# Patient Record
Sex: Female | Born: 1960 | Race: White | Hispanic: No | Marital: Single | Smoking: Current every day smoker
Health system: Southern US, Community
[De-identification: ages and names within clinical notes are randomized; demographics above are authoritative.]

## PROBLEM LIST (undated history)

## (undated) DIAGNOSIS — E119 Type 2 diabetes mellitus without complications: Secondary | ICD-10-CM

## (undated) DIAGNOSIS — I10 Essential (primary) hypertension: Secondary | ICD-10-CM

## (undated) DIAGNOSIS — E785 Hyperlipidemia, unspecified: Secondary | ICD-10-CM

## (undated) DIAGNOSIS — J449 Chronic obstructive pulmonary disease, unspecified: Secondary | ICD-10-CM

## (undated) DIAGNOSIS — K279 Peptic ulcer, site unspecified, unspecified as acute or chronic, without hemorrhage or perforation: Secondary | ICD-10-CM

## (undated) HISTORY — PX: ABDOMINAL HYSTERECTOMY: SHX81

## (undated) HISTORY — DX: Peptic ulcer, site unspecified, unspecified as acute or chronic, without hemorrhage or perforation: K27.9

## (undated) HISTORY — DX: Type 2 diabetes mellitus without complications: E11.9

## (undated) HISTORY — DX: Hyperlipidemia, unspecified: E78.5

## (undated) HISTORY — PX: NOVASURE ABLATION: SHX5394

## (undated) HISTORY — DX: Essential (primary) hypertension: I10

## (undated) HISTORY — DX: Chronic obstructive pulmonary disease, unspecified: J44.9

## (undated) HISTORY — PX: BLADDER SUSPENSION: SHX72

---

## 2002-01-23 ENCOUNTER — Encounter: Payer: Self-pay | Admitting: Emergency Medicine

## 2002-01-23 ENCOUNTER — Emergency Department (HOSPITAL_COMMUNITY): Admission: EM | Admit: 2002-01-23 | Discharge: 2002-01-23 | Payer: Self-pay | Admitting: Emergency Medicine

## 2002-04-29 ENCOUNTER — Emergency Department (HOSPITAL_COMMUNITY): Admission: EM | Admit: 2002-04-29 | Discharge: 2002-04-29 | Payer: Self-pay | Admitting: *Deleted

## 2002-11-14 ENCOUNTER — Encounter: Payer: Self-pay | Admitting: *Deleted

## 2002-11-14 ENCOUNTER — Emergency Department (HOSPITAL_COMMUNITY): Admission: EM | Admit: 2002-11-14 | Discharge: 2002-11-14 | Payer: Self-pay | Admitting: *Deleted

## 2004-02-01 ENCOUNTER — Emergency Department (HOSPITAL_COMMUNITY): Admission: EM | Admit: 2004-02-01 | Discharge: 2004-02-01 | Payer: Self-pay | Admitting: Emergency Medicine

## 2004-05-28 ENCOUNTER — Ambulatory Visit (HOSPITAL_COMMUNITY): Admission: RE | Admit: 2004-05-28 | Discharge: 2004-05-28 | Payer: Self-pay | Admitting: Family Medicine

## 2004-06-17 ENCOUNTER — Ambulatory Visit (HOSPITAL_COMMUNITY): Admission: RE | Admit: 2004-06-17 | Discharge: 2004-06-17 | Payer: Self-pay | Admitting: Family Medicine

## 2004-07-09 ENCOUNTER — Ambulatory Visit (HOSPITAL_COMMUNITY): Admission: RE | Admit: 2004-07-09 | Discharge: 2004-07-09 | Payer: Self-pay | Admitting: Family Medicine

## 2005-03-03 ENCOUNTER — Emergency Department (HOSPITAL_COMMUNITY): Admission: EM | Admit: 2005-03-03 | Discharge: 2005-03-03 | Payer: Self-pay | Admitting: Emergency Medicine

## 2005-03-05 ENCOUNTER — Emergency Department (HOSPITAL_COMMUNITY): Admission: EM | Admit: 2005-03-05 | Discharge: 2005-03-05 | Payer: Self-pay | Admitting: Emergency Medicine

## 2005-07-15 ENCOUNTER — Ambulatory Visit (HOSPITAL_COMMUNITY): Admission: RE | Admit: 2005-07-15 | Discharge: 2005-07-15 | Payer: Self-pay | Admitting: Family Medicine

## 2005-08-02 ENCOUNTER — Emergency Department (HOSPITAL_COMMUNITY): Admission: EM | Admit: 2005-08-02 | Discharge: 2005-08-02 | Payer: Self-pay | Admitting: Emergency Medicine

## 2006-07-19 ENCOUNTER — Ambulatory Visit (HOSPITAL_COMMUNITY): Admission: RE | Admit: 2006-07-19 | Discharge: 2006-07-19 | Payer: Self-pay | Admitting: Family Medicine

## 2010-10-19 ENCOUNTER — Encounter: Payer: Self-pay | Admitting: Family Medicine

## 2011-06-14 ENCOUNTER — Emergency Department: Payer: Self-pay | Admitting: Emergency Medicine

## 2011-06-28 ENCOUNTER — Emergency Department: Payer: Self-pay | Admitting: Emergency Medicine

## 2011-08-12 ENCOUNTER — Emergency Department: Payer: Self-pay

## 2011-09-16 ENCOUNTER — Emergency Department: Payer: Self-pay

## 2011-09-25 ENCOUNTER — Encounter: Payer: Self-pay | Admitting: Cardiovascular Disease

## 2011-09-25 ENCOUNTER — Ambulatory Visit (INDEPENDENT_AMBULATORY_CARE_PROVIDER_SITE_OTHER): Payer: Medicaid Other | Admitting: Cardiovascular Disease

## 2011-09-25 DIAGNOSIS — R0602 Shortness of breath: Secondary | ICD-10-CM

## 2011-09-25 DIAGNOSIS — R079 Chest pain, unspecified: Secondary | ICD-10-CM | POA: Insufficient documentation

## 2011-09-25 NOTE — Assessment & Plan Note (Signed)
Erika Downs presents with episodes of chest pain. These chest pains or pleuritic and has been mostly constant for the past 10 days. I doubt that this is due to a cardiac etiology. I suspect that this may be pleurisy or perhaps a pulmonary embolus. We'll schedule her for a CT angiogram of the chest to rule out pulmonary embolus.  I've asked her to get a general medical doctor for further evaluation. Given her negative workup so far and the relative constant nature the chest pain, I don't think that she needs a stress test.  If the CT scan shows possible and she will need to be admitted for initiation of heparin and Coumadin. We'll see her for followup if that is the case. Otherwise she'll follow up with a general medical Dr. And I'll see her on an as-needed basis.

## 2011-09-25 NOTE — Progress Notes (Signed)
    Erika Downs Date of Birth  [birth year] [facility]     [place] [facility]  [address]    [address]   [address] [place], [place]  [address]    [place], [place]  [address] [phone]  Fax  [phone]  [phone]  Fax [phone]   History of Present Illness:  Erika Downs is a 50 year old female with a history of chest pains for the past 10 days. She has been seen at [facility] as well as [facility] in [place]. Her workup has been negative so far. She's had negative troponin levels at each hospital. She's not had a stress test.  The patient has been there constantly for 10 days.  The pain has a definite pleuritic component.   She is more short of breath for the past several weeks.  She denies any fever. She denies any sputum production.    She smokes 4 cigarettes a day.  She used to smoke more.  No current outpatient prescriptions on file prior to visit.    Allergies  Allergen Reactions  . Ibuprofen   . Penicillins     Past Medical History  Diagnosis Date  . Chest pain   . Hyperlipidemia     History reviewed. No pertinent past surgical history.  History  Smoking status  . Current Everyday Smoker  . Types: Cigarettes  Smokeless tobacco  . Not on file    History  Alcohol Use: Not on file    History reviewed. No pertinent family history.  Reviw of Systems:  Reviewed in the HPI.  All other systems are negative.  Physical Exam: BP 118/74  Pulse 80  Ht 5' 2.5" (1.588 m)  Wt 165 lb (74.844 kg)  BMI 29.70 kg/m2 The patient is alert and oriented x 3.    Pt is emotional and crying - holding her chest  Skin: warm and dry.  Color is normal.    HEENT:   Normocephalic/atraumatic. Is no JVD.  Lungs: clear, no rubs   Heart: RR, no m/g/r    Abdomen: soft, moderately obese  Extremities:  No c/c/e. No palpable cords  Neuro:  Non focal.  CN II-XII intact.     ECG: NSR, no ST or T wave changes.  Assessment / Plan:

## 2011-09-25 NOTE — Patient Instructions (Signed)
We will send you for CT scan now of your chest and will call you with results.

## 2011-12-05 ENCOUNTER — Ambulatory Visit: Payer: Self-pay | Admitting: Internal Medicine

## 2011-12-05 LAB — RAPID INFLUENZA A&B ANTIGENS

## 2011-12-19 ENCOUNTER — Emergency Department: Payer: Self-pay | Admitting: Emergency Medicine

## 2011-12-19 LAB — BASIC METABOLIC PANEL
BUN: 13 mg/dL (ref 7–18)
Calcium, Total: 9 mg/dL (ref 8.5–10.1)
Creatinine: 0.7 mg/dL (ref 0.60–1.30)
EGFR (Non-African Amer.): >60
Glucose: 93 mg/dL (ref 65–99)
Potassium: 3.9 mmol/L (ref 3.5–5.1)
Sodium: 145 mmol/L (ref 136–145)

## 2011-12-19 LAB — TROPONIN I: Troponin-I: <0.02 ng/mL

## 2011-12-19 LAB — CBC
MCHC: 34.2 g/dL (ref 32.0–36.0)
RBC: 4.55 10*6/uL (ref 3.80–5.20)
RDW: 13.4 % (ref 11.5–14.5)
WBC: 8.9 10*3/uL (ref 3.6–11.0)

## 2011-12-21 ENCOUNTER — Emergency Department: Payer: Self-pay

## 2012-03-31 ENCOUNTER — Emergency Department: Payer: Self-pay | Admitting: Emergency Medicine

## 2012-05-08 ENCOUNTER — Emergency Department: Payer: Self-pay | Admitting: Emergency Medicine

## 2012-06-18 ENCOUNTER — Emergency Department: Payer: Self-pay | Admitting: Internal Medicine

## 2012-09-12 ENCOUNTER — Emergency Department: Payer: Self-pay | Admitting: Emergency Medicine

## 2012-09-12 LAB — CBC WITH DIFFERENTIAL/PLATELET
Basophil #: 0 10*3/uL (ref 0.0–0.1)
Basophil %: 0.5 %
Eosinophil #: 0.2 10*3/uL (ref 0.0–0.7)
Eosinophil %: 2.1 %
HGB: 14.9 g/dL (ref 12.0–16.0)
Lymphocyte %: 32.6 %
MCH: 31.8 pg (ref 26.0–34.0)
Neutrophil %: 55 %
Platelet: 250 10*3/uL (ref 150–440)
RBC: 4.68 10*6/uL (ref 3.80–5.20)
WBC: 8.6 10*3/uL (ref 3.6–11.0)

## 2012-09-12 LAB — COMPREHENSIVE METABOLIC PANEL
Albumin: 3.9 g/dL (ref 3.4–5.0)
Alkaline Phosphatase: 102 U/L (ref 50–136)
Anion Gap: 6 — ABNORMAL LOW (ref 7–16)
BUN: 15 mg/dL (ref 7–18)
Bilirubin,Total: 0.2 mg/dL (ref 0.2–1.0)
Co2: 25 mmol/L (ref 21–32)
Creatinine: 0.6 mg/dL (ref 0.60–1.30)
Osmolality: 282 (ref 275–301)
SGOT(AST): 23 U/L (ref 15–37)
SGPT (ALT): 21 U/L (ref 12–78)

## 2013-01-28 ENCOUNTER — Emergency Department: Payer: Self-pay | Admitting: Emergency Medicine

## 2013-07-11 ENCOUNTER — Emergency Department: Payer: Self-pay | Admitting: Emergency Medicine

## 2013-07-21 ENCOUNTER — Ambulatory Visit: Payer: Self-pay | Admitting: Otolaryngology

## 2013-08-03 ENCOUNTER — Emergency Department: Payer: Self-pay | Admitting: Emergency Medicine

## 2014-02-27 ENCOUNTER — Emergency Department: Payer: Self-pay | Admitting: Emergency Medicine

## 2014-03-29 ENCOUNTER — Emergency Department: Payer: Self-pay | Admitting: Emergency Medicine

## 2014-12-13 ENCOUNTER — Emergency Department: Payer: Self-pay | Admitting: Emergency Medicine

## 2015-03-28 ENCOUNTER — Emergency Department: Payer: Medicaid Other

## 2015-03-28 ENCOUNTER — Encounter: Payer: Self-pay | Admitting: Emergency Medicine

## 2015-03-28 ENCOUNTER — Emergency Department
Admission: EM | Admit: 2015-03-28 | Discharge: 2015-03-28 | Disposition: A | Discharge status: Home / Self Care | Payer: Medicaid Other | Attending: Emergency Medicine | Admitting: Emergency Medicine

## 2015-03-28 DIAGNOSIS — Y998 Other external cause status: Secondary | ICD-10-CM | POA: Insufficient documentation

## 2015-03-28 DIAGNOSIS — S80212A Abrasion, left knee, initial encounter: Secondary | ICD-10-CM | POA: Insufficient documentation

## 2015-03-28 DIAGNOSIS — Y9289 Other specified places as the place of occurrence of the external cause: Secondary | ICD-10-CM | POA: Insufficient documentation

## 2015-03-28 DIAGNOSIS — S46912A Strain of unspecified muscle, fascia and tendon at shoulder and upper arm level, left arm, initial encounter: Secondary | ICD-10-CM | POA: Insufficient documentation

## 2015-03-28 DIAGNOSIS — S99911A Unspecified injury of right ankle, initial encounter: Secondary | ICD-10-CM | POA: Insufficient documentation

## 2015-03-28 DIAGNOSIS — Z880 Allergy status to penicillin: Secondary | ICD-10-CM | POA: Insufficient documentation

## 2015-03-28 DIAGNOSIS — S80211A Abrasion, right knee, initial encounter: Secondary | ICD-10-CM

## 2015-03-28 DIAGNOSIS — S5002XA Contusion of left elbow, initial encounter: Secondary | ICD-10-CM | POA: Insufficient documentation

## 2015-03-28 DIAGNOSIS — Y9389 Activity, other specified: Secondary | ICD-10-CM | POA: Insufficient documentation

## 2015-03-28 DIAGNOSIS — S0990XA Unspecified injury of head, initial encounter: Secondary | ICD-10-CM | POA: Insufficient documentation

## 2015-03-28 DIAGNOSIS — W1839XA Other fall on same level, initial encounter: Secondary | ICD-10-CM | POA: Insufficient documentation

## 2015-03-28 DIAGNOSIS — Z720 Tobacco use: Secondary | ICD-10-CM | POA: Insufficient documentation

## 2015-03-28 MED ORDER — CYCLOBENZAPRINE HCL 10 MG PO TABS
ORAL_TABLET | ORAL | Status: AC
  Filled 2015-03-28: qty 1

## 2015-03-28 MED ORDER — BACITRACIN 500 UNIT/GM EX OINT
1.0000 "application " | TOPICAL_OINTMENT | Freq: Two times a day (BID) | CUTANEOUS | Status: DC
  Administered 2015-03-28: 1 via TOPICAL

## 2015-03-28 MED ORDER — TRAMADOL HCL 50 MG PO TABS
50.0000 mg | ORAL_TABLET | Freq: Once | ORAL | Status: AC
  Administered 2015-03-28: 50 mg via ORAL

## 2015-03-28 MED ORDER — OXYCODONE HCL 5 MG PO TABS
ORAL_TABLET | ORAL | Status: AC
  Filled 2015-03-28: qty 1

## 2015-03-28 MED ORDER — OXYCODONE HCL 5 MG PO TABS
5.0000 mg | ORAL_TABLET | Freq: Once | ORAL | Status: AC
  Administered 2015-03-28: 5 mg via ORAL

## 2015-03-28 MED ORDER — BACITRACIN ZINC 500 UNIT/GM EX OINT
TOPICAL_OINTMENT | CUTANEOUS | Status: AC
  Filled 2015-03-28: qty 0.9

## 2015-03-28 MED ORDER — CYCLOBENZAPRINE HCL 10 MG PO TABS: 10.0000 mg | ORAL_TABLET | Freq: Three times a day (TID) | ORAL | Status: DC | PRN

## 2015-03-28 MED ORDER — TRAMADOL HCL 50 MG PO TABS: 50.0000 mg | ORAL_TABLET | Freq: Four times a day (QID) | ORAL | Status: DC | PRN

## 2015-03-28 MED ORDER — TRAMADOL HCL 50 MG PO TABS
ORAL_TABLET | ORAL | Status: AC
  Filled 2015-03-28: qty 1

## 2015-03-28 MED ORDER — CYCLOBENZAPRINE HCL 10 MG PO TABS
5.0000 mg | ORAL_TABLET | Freq: Once | ORAL | Status: AC
  Administered 2015-03-28: 5 mg via ORAL

## 2015-03-28 NOTE — ED Provider Notes (Signed)
[facility] Emergency Department Provider Note  ____________________________________________  Time seen: Approximately 8:46 PM  I have reviewed the triage vital signs and the nursing notes.   HISTORY  Chief Complaint Fall   HPI Erika Downs is a 53 y.o. female who presents to the emergency department for evaluation of left shoulder, left elbow, and right ankle pain. She states that she rolled her right ankle after stepping on a rock and caused her to fall. Pain is "worse than a 10". She states that she has not taken anything for pain since the fall. She denies striking her head. She denies loss of consciousness.   Past Medical History  Diagnosis Date  . Chest pain   . Hyperlipidemia     Patient Active Problem List   Diagnosis Date Noted  . Chest pain 09/25/2011    Past Surgical History  Procedure Laterality Date  . Bladder suspension    . Novasure ablation      No current outpatient prescriptions on file.  Allergies Ibuprofen and Penicillins  No family history on file.  Social History History  Substance Use Topics  . Smoking status: Current Every Day Smoker    Types: Cigarettes  . Smokeless tobacco: Not on file  . Alcohol Use: Yes    Review of Systems Constitutional: No fever/chills Eyes: No visual changes. ENT: No sore throat. Cardiovascular: Denies chest pain. Respiratory: Denies shortness of breath. Gastrointestinal: No abdominal pain.  No nausea, no vomiting.  No diarrhea.  No constipation. Musculoskeletal: Left shoulder, left elbow, right ankle, bilateral knee pain Skin: Abrasions to left elbow and bilateral knees. Neurological: Negative for headaches, focal weakness or numbness.  10-point ROS otherwise negative.  ____________________________________________   PHYSICAL EXAM:  VITAL SIGNS: ED Triage Vitals  Enc Vitals Group     BP 03/28/15 1952 150/96 mmHg     Pulse Rate 03/28/15 1952 69     Resp 03/28/15 1952 16      Temp 03/28/15 1952 98 F (36.7 C)     Temp Source 03/28/15 1952 Oral     SpO2 03/28/15 1952 96 %     Weight 03/28/15 1952 160 lb (72.576 kg)     Height 03/28/15 1952  (1.575 m)     Head Cir --      Peak Flow --      Pain Score 03/28/15 1953 10     Pain Loc --      Pain Edu? --      Excl. in GC? --     Constitutional: Alert and oriented. Well appearing and in no acute distress. Eyes: Conjunctivae are normal. PERRL. EOMI. Head: Atraumatic. Nose: No congestion/rhinnorhea. Mouth/Throat: Mucous membranes are moist.  Oropharynx non-erythematous. Neck: No stridor.   Cardiovascular: Normal rate, regular rhythm. Grossly normal heart sounds.  Good peripheral circulation. Respiratory: Normal respiratory effort.  No retractions. Lungs CTAB. Gastrointestinal: Soft and nontender. No distention. No abdominal bruits. No CVA tenderness. Musculoskeletal: Patient unwilling to attempt to move left arm at shoulder or elbow due to pain. Mild swelling present to right ankle.  No joint effusions. Neurologic:  Normal speech and language. No gross focal neurologic deficits are appreciated. Speech is normal. No gait instability. Skin:  Skin is warm, dry and intact. No rash noted. Psychiatric: Mood and affect are normal. Speech and behavior are normal.  ____________________________________________   LABS (all labs ordered are listed, but only abnormal results are displayed)  Labs Reviewed - No data to display ____________________________________________  EKG   ____________________________________________  RADIOLOGY  Images of the right ankle, left elbow and left shoulder reviewed. No acute pathology identified. Images viewed by me. ____________________________________________   PROCEDURES  Procedure(s) performed: Ace bandage applied to right ankle, neurovascular intact post application.  Sling applied to left arm. Bacitracin dressings applied to abrasions by RN.  Critical Care  performed: No  ____________________________________________   INITIAL IMPRESSION / ASSESSMENT AND PLAN / ED COURSE  Pertinent labs & imaging results that were available during my care of the patient were reviewed by me and considered in my medical decision making (see chart for details).  Patient was advised to follow up with the orthopedic doctor for symptoms that are not improving over the week. She was advised to return to the ER for symptoms that change or worsen if unable to schedule an appointment. ____________________________________________   FINAL CLINICAL IMPRESSION(S) / ED DIAGNOSES  Final diagnoses:  None      Chinita PesterCari B Marico Buckle, FNP 03/28/15 2211  Loleta Roseory Forbach, MD 03/29/15 2109

## 2015-03-28 NOTE — ED Notes (Signed)
Patient states she rolled her foot on a rock and fell. C/o right ankle, left shoulder and left elbow pain. Abrasion to left knee and left elbow. Denies any dizziness or lightheadedness.

## 2015-03-28 NOTE — ED Notes (Signed)
Pt back from xray at this time.

## 2015-04-24 ENCOUNTER — Encounter: Payer: Self-pay | Admitting: *Deleted

## 2015-04-24 ENCOUNTER — Emergency Department
Admission: EM | Admit: 2015-04-24 | Discharge: 2015-04-24 | Disposition: A | Discharge status: Home / Self Care | Payer: Self-pay | Attending: Emergency Medicine | Admitting: Emergency Medicine

## 2015-04-24 DIAGNOSIS — X58XXXA Exposure to other specified factors, initial encounter: Secondary | ICD-10-CM | POA: Insufficient documentation

## 2015-04-24 DIAGNOSIS — Y998 Other external cause status: Secondary | ICD-10-CM | POA: Insufficient documentation

## 2015-04-24 DIAGNOSIS — S46212A Strain of muscle, fascia and tendon of other parts of biceps, left arm, initial encounter: Secondary | ICD-10-CM | POA: Insufficient documentation

## 2015-04-24 DIAGNOSIS — Y9389 Activity, other specified: Secondary | ICD-10-CM | POA: Insufficient documentation

## 2015-04-24 DIAGNOSIS — Z880 Allergy status to penicillin: Secondary | ICD-10-CM | POA: Insufficient documentation

## 2015-04-24 DIAGNOSIS — Y9289 Other specified places as the place of occurrence of the external cause: Secondary | ICD-10-CM | POA: Insufficient documentation

## 2015-04-24 DIAGNOSIS — Z720 Tobacco use: Secondary | ICD-10-CM | POA: Insufficient documentation

## 2015-04-24 MED ORDER — TRAMADOL HCL 50 MG PO TABS: 50.0000 mg | ORAL_TABLET | Freq: Two times a day (BID) | ORAL | Status: DC

## 2015-04-24 MED ORDER — CYCLOBENZAPRINE HCL 5 MG PO TABS: 5.0000 mg | ORAL_TABLET | Freq: Three times a day (TID) | ORAL | Status: DC | PRN

## 2015-04-24 NOTE — ED Notes (Signed)
Pain to left upper arm beginning a few weeks ago. Pain is from elbow to shoulder. Very painful to medial arm, not so much to distal area. Painful to move arm in any direction. Pain is 10/10. Patient has used ice and heat. No use of ibuprofen or tylenol. Denies any injury to arm.

## 2015-04-24 NOTE — Discharge Instructions (Signed)
Muscle Strain A muscle strain is an injury that occurs when a muscle is stretched beyond its normal length. Usually a small number of muscle fibers are torn when this happens. Muscle strain is rated in degrees. First-degree strains have the least amount of muscle fiber tearing and pain. Second-degree and third-degree strains have increasingly more tearing and pain.  Usually, recovery from muscle strain takes 1-2 weeks. Complete healing takes 5-6 weeks.  CAUSES  Muscle strain happens when a sudden, violent force placed on a muscle stretches it too far. This may occur with lifting, sports, or a fall.  RISK FACTORS Muscle strain is especially common in athletes.  SIGNS AND SYMPTOMS At the site of the muscle strain, there may be:  Pain.  Bruising.  Swelling.  Difficulty using the muscle due to pain or lack of normal function. DIAGNOSIS  Your health care provider will perform a physical exam and ask about your medical history. TREATMENT  Often, the best treatment for a muscle strain is resting, icing, and applying cold compresses to the injured area.  HOME CARE INSTRUCTIONS   Use the PRICE method of treatment to promote muscle healing during the first 2-3 days after your injury. The PRICE method involves:  Protecting the muscle from being injured again.  Restricting your activity and resting the injured body part.  Icing your injury. To do this, put ice in a plastic bag. Place a towel between your skin and the bag. Then, apply the ice and leave it on from 15-20 minutes each hour. After the third day, switch to moist heat packs.  Apply compression to the injured area with a splint or elastic bandage. Be careful not to wrap it too tightly. This may interfere with blood circulation or increase swelling.  Elevate the injured body part above the level of your heart as often as you can.  Only take over-the-counter or prescription medicines for pain, discomfort, or fever as directed by your  health care provider.  Warming up prior to exercise helps to prevent future muscle strains. SEEK MEDICAL CARE IF:   You have increasing pain or swelling in the injured area.  You have numbness, tingling, or a significant loss of strength in the injured area. MAKE SURE YOU:   Understand these instructions.  Will watch your condition.  Will get help right away if you are not doing well or get worse. Document Released: 09/14/2005 Document Revised: 07/05/2013 Document Reviewed: 04/13/2013 [facility] Patient Information 2015 [place], [place]. This information is not intended to replace advice given to you by your health care provider. Make sure you discuss any questions you have with your health care provider.   Take the prescription meds as directed. Apply ice and moist heat as needed for pain relief. Follow-up with one of the community clinics for ongoing symptoms.

## 2015-04-24 NOTE — ED Provider Notes (Signed)
[facility] Emergency Department Provider Note ____________________________________________  Time seen: 1829  I have reviewed the triage vital signs and the nursing notes.  HISTORY  Chief Complaint  Arm Pain  HPI Erika Downs is a 54 y.o. female who is right-hand dominant, who reports to the ED with his upper left arm for the last few weeks. She reports the pain to the musculature between the elbow and the shoulder. She describes the pain is increased with movement of the arm she rates the pain at a 10/10 in triage. She has use ice and heat with no significant relief, and has not dosed any over-the-counter medications for pain relief. She was seen here about a month prior for injuries to the left arm following a fall. Radiology did not find any fracture or dislocation to the left upper extremity. She denies any interim injury, trauma, fall, or accident.  Past Medical History  Diagnosis Date  . Chest pain   . Hyperlipidemia     Patient Active Problem List   Diagnosis Date Noted  . Chest pain 09/25/2011    Past Surgical History  Procedure Laterality Date  . Bladder suspension    . Novasure ablation      Current Outpatient Rx  Name  Route  Sig  Dispense  Refill  . cyclobenzaprine (FLEXERIL) 5 MG tablet   Oral   Take 1 tablet (5 mg total) by mouth every 8 (eight) hours as needed for muscle spasms.   12 tablet   0   . traMADol (ULTRAM) 50 MG tablet   Oral   Take 1 tablet (50 mg total) by mouth 2 (two) times daily.   10 tablet   0    Allergies Ibuprofen and Penicillins  No family history on file.  Social History History  Substance Use Topics  . Smoking status: Current Every Day Smoker    Types: Cigarettes  . Smokeless tobacco: Not on file  . Alcohol Use: Yes   Review of Systems  Constitutional: Negative for fever. Eyes: Negative for visual changes. ENT: Negative for sore throat. Cardiovascular: Negative for chest pain. Respiratory:  Negative for shortness of breath. Gastrointestinal: Negative for abdominal pain, vomiting and diarrhea. Genitourinary: Negative for dysuria. Musculoskeletal: Negative for back pain. Right upper arm pain. Skin: Negative for rash. Neurological: Negative for headaches, focal weakness or numbness. ____________________________________________  PHYSICAL EXAM:  VITAL SIGNS: ED Triage Vitals  Enc Vitals Group     BP 04/24/15 1745 155/94 mmHg     Pulse Rate 04/24/15 1745 69     Resp 04/24/15 1745 16     Temp 04/24/15 1745 97.7 F (36.5 C)     Temp Source 04/24/15 1745 Oral     SpO2 04/24/15 1745 98 %     Weight 04/24/15 1745 160 lb (72.576 kg)     Height 04/24/15 1745 5\' 2"  (1.575 m)     Head Cir --      Peak Flow --      Pain Score 04/24/15 1746 9     Pain Loc --      Pain Edu? --      Excl. in GC? --    Constitutional: Alert and oriented. Well appearing and in no distress. Eyes: Conjunctivae are normal. PERRL. Normal extraocular movements. ENT   Head: Normocephalic and atraumatic.   Nose: No congestion/rhinnorhea.   Mouth/Throat: Mucous membranes are moist.   Neck: Supple. No thyromegaly. Hematological/Lymphatic/Immunilogical: No cervical lymphadenopathy. Cardiovascular: Normal rate, regular rhythm. Normal  distal pulses Respiratory: Normal respiratory effort.  Musculoskeletal: Nontender with normal range of motion in right UE. No deformity, abrasion, laceration, or infection. Normal grip strength bilaterally. Negative Yeargason's.   Neurologic:  CN II-XII grossly intact. Normal UE DTRs bilaterally. Normal gait without ataxia. Normal speech and language. No gross focal neurologic deficits are appreciated. Skin:  Skin is warm, dry and intact. No rash noted. Psychiatric: Mood and affect are normal. Patient exhibits appropriate insight and judgment. ____________________________________________  INITIAL IMPRESSION / ASSESSMENT AND PLAN / ED COURSE  Left arm biceps  strain. Treatment with Ultram & Flexeril. Ice and heat therapy for pain relief.  Referral to local community health clinic for further care. ____________________________________________  FINAL CLINICAL IMPRESSION(S) / ED DIAGNOSES  Final diagnoses:  Biceps muscle strain, left, initial encounter     Lissa Hoard, PA-C 04/24/15 1859  Jene Every, MD 04/24/15 2320

## 2015-04-24 NOTE — ED Notes (Signed)
Pt states "soreness" pain from her left elbow through her upper arm for two weeks. Pain worse when she extends her arm. Pt denies injury, but she did fall about 1 month ago onto her left side.

## 2016-01-05 ENCOUNTER — Emergency Department
Admission: EM | Admit: 2016-01-05 | Discharge: 2016-01-05 | Disposition: A | Discharge status: Home / Self Care | Payer: Self-pay | Attending: Emergency Medicine | Admitting: Emergency Medicine

## 2016-01-05 ENCOUNTER — Encounter: Payer: Self-pay | Admitting: Emergency Medicine

## 2016-01-05 ENCOUNTER — Emergency Department: Payer: Self-pay

## 2016-01-05 DIAGNOSIS — F17210 Nicotine dependence, cigarettes, uncomplicated: Secondary | ICD-10-CM | POA: Insufficient documentation

## 2016-01-05 DIAGNOSIS — E785 Hyperlipidemia, unspecified: Secondary | ICD-10-CM | POA: Insufficient documentation

## 2016-01-05 DIAGNOSIS — R0789 Other chest pain: Secondary | ICD-10-CM | POA: Insufficient documentation

## 2016-01-05 DIAGNOSIS — Z888 Allergy status to other drugs, medicaments and biological substances status: Secondary | ICD-10-CM | POA: Insufficient documentation

## 2016-01-05 DIAGNOSIS — Z880 Allergy status to penicillin: Secondary | ICD-10-CM | POA: Insufficient documentation

## 2016-01-05 DIAGNOSIS — Z79899 Other long term (current) drug therapy: Secondary | ICD-10-CM | POA: Insufficient documentation

## 2016-01-05 DIAGNOSIS — M62838 Other muscle spasm: Secondary | ICD-10-CM

## 2016-01-05 DIAGNOSIS — M62830 Muscle spasm of back: Secondary | ICD-10-CM | POA: Insufficient documentation

## 2016-01-05 MED ORDER — OXYCODONE-ACETAMINOPHEN 5-325 MG PO TABS
1.0000 | ORAL_TABLET | ORAL | Status: DC | PRN
Start: 2016-01-05 — End: 2016-08-06

## 2016-01-05 MED ORDER — DIAZEPAM 2 MG PO TABS
2.0000 mg | ORAL_TABLET | Freq: Once | ORAL | Status: AC
  Administered 2016-01-05: 2 mg via ORAL
  Filled 2016-01-05: qty 1

## 2016-01-05 MED ORDER — DIAZEPAM 2 MG PO TABS: 2.0000 mg | ORAL_TABLET | Freq: Three times a day (TID) | ORAL | Status: DC | PRN

## 2016-01-05 MED ORDER — OXYCODONE-ACETAMINOPHEN 5-325 MG PO TABS
2.0000 | ORAL_TABLET | Freq: Once | ORAL | Status: AC
  Administered 2016-01-05: 2 via ORAL
  Filled 2016-01-05: qty 2

## 2016-01-05 NOTE — Discharge Instructions (Signed)
Begin taking medication as directed. Percocet as needed for pain and diazepam as needed for muscle spasms. Use ice or heat for comfort. [facility] if any continued problems.

## 2016-01-05 NOTE — ED Provider Notes (Signed)
[facility] Emergency Department Provider Note  ____________________________________________  Time seen: Approximately 1:00 PM  I have reviewed the triage vital signs and the nursing notes.   HISTORY  Chief Complaint Back Pain    HPI Erika Downs is a 55 y.o. female is here with complaint of right sided back pain while she was getting ready for church this morning. Patient states that she sneezed and immediately felt a "popping sensation" followed by sudden onset of her back pain. She has not taken any over-the-counter medication for her pain. She states with movement she is in a great deal of pain. She denies any previous injury to her ribs. Movement increases her pain and nothing seems to improve it. At present patient's pain is 10 over 10.   Past Medical History  Diagnosis Date  . Chest pain   . Hyperlipidemia     Patient Active Problem List   Diagnosis Date Noted  . Chest pain 09/25/2011    Past Surgical History  Procedure Laterality Date  . Bladder suspension    . Novasure ablation      Current Outpatient Rx  Name  Route  Sig  Dispense  Refill  . cyclobenzaprine (FLEXERIL) 5 MG tablet   Oral   Take 1 tablet (5 mg total) by mouth every 8 (eight) hours as needed for muscle spasms.   12 tablet   0   . diazepam (VALIUM) 2 MG tablet   Oral   Take 1 tablet (2 mg total) by mouth every 8 (eight) hours as needed for muscle spasms.   9 tablet   0   . oxyCODONE-acetaminophen (PERCOCET) 5-325 MG tablet   Oral   Take 1-2 tablets by mouth every 4 (four) hours as needed for severe pain.   30 tablet   0   . traMADol (ULTRAM) 50 MG tablet   Oral   Take 1 tablet (50 mg total) by mouth 2 (two) times daily.   10 tablet   0     Allergies Ibuprofen and Penicillins  History reviewed. No pertinent family history.  Social History Social History  Substance Use Topics  . Smoking status: Current Every Day Smoker -- 0.50 packs/day    Types:  Cigarettes  . Smokeless tobacco: Never Used  . Alcohol Use: Yes    Review of Systems Constitutional: No fever/chills ENT: No sore throat. Cardiovascular: Denies chest pain. Respiratory: Shortness of breath due to right sided chest pain as stated above. Gastrointestinal: No abdominal pain.  No nausea, no vomiting.   Genitourinary: Negative for dysuria. Musculoskeletal: Positive right posterior and lateral rib pain. Skin: Negative for rash. Neurological: Negative for headaches, focal weakness or numbness.  10-point ROS otherwise negative.  ____________________________________________   PHYSICAL EXAM:  VITAL SIGNS: ED Triage Vitals  Enc Vitals Group     BP 01/05/16 1251 144/92 mmHg     Pulse Rate 01/05/16 1251 70     Resp 01/05/16 1251 22     Temp 01/05/16 1251 97.2 F (36.2 C)     Temp Source 01/05/16 1251 Oral     SpO2 01/05/16 1251 96 %     Weight 01/05/16 1251 160 lb (72.576 kg)     Height 01/05/16 1251 5\' 2"  (1.575 m)     Head Cir --      Peak Flow --      Pain Score --      Pain Loc --      Pain Edu? --  Excl. in GC? --     Constitutional: Alert and oriented. Well appearing and in no acute distress. Eyes: Conjunctivae are normal. PERRL. EOMI. Head: Atraumatic. Nose: No congestion/rhinnorhea. Mouth/Throat: Mucous membranes are moist.  Oropharynx non-erythematous. Neck: No stridor.   Cardiovascular: Normal rate, regular rhythm. Grossly normal heart sounds.  Good peripheral circulation. Respiratory: Decreased respiratory effort secondary to patient's pain. With deep inspiration patient's pain is increased on the right lateral rib cage area. There is marked tenderness on palpation of the lower posterior and lateral ribs on the right. There is no gross deformity or ecchymosis noted. Patient is guarding range of motion secondary to pain. Musculoskeletal: No lower extremity tenderness nor edema.  No joint effusions. Neurologic:  Normal speech and language. No gross  focal neurologic deficits are appreciated. No gait instability. Skin:  Skin is warm, dry and intact. No rash noted. Psychiatric: Mood and affect are normal. Speech and behavior are normal.  ____________________________________________   LABS (all labs ordered are listed, but only abnormal results are displayed)  Labs Reviewed - No data to display  RADIOLOGY  X-ray right ribs shows no acute cardiopulmonary disease or fracture of the right ribs per radiologist. ____________________________________________   PROCEDURES  Procedure(s) performed: None  Critical Care performed: No  ____________________________________________   INITIAL IMPRESSION / ASSESSMENT AND PLAN / ED COURSE  Pertinent labs & imaging results that were available during my care of the patient were reviewed by me and considered in my medical decision making (see chart for details).  Patient was given Percocet and diazepam for pain and muscle spasms and improved somewhat in the emergency room. Patient was made aware that there is no fractures as previously thought. Patient is continue on Percocet and diazepam. She is encouraged to use ice or heat to the muscles as needed for comfort. She is to follow-up with [facility] clinic if any continued problems. ____________________________________________   FINAL CLINICAL IMPRESSION(S) / ED DIAGNOSES  Final diagnoses:  Chest wall pain  Muscle spasm      Tommi Rumps, PA-C 01/05/16 1535  Rockne Menghini, MD 01/06/16 1507

## 2016-01-05 NOTE — ED Notes (Signed)
Pt states she was getting ready for church this morning when she sneezed. Pt then states she felt a "popping sensation" with sudden onset back pain R side at the mid back.

## 2016-08-06 ENCOUNTER — Emergency Department
Admission: EM | Admit: 2016-08-06 | Discharge: 2016-08-06 | Disposition: A | Discharge status: Home / Self Care | Payer: Self-pay | Attending: Emergency Medicine | Admitting: Emergency Medicine

## 2016-08-06 ENCOUNTER — Emergency Department: Payer: Self-pay

## 2016-08-06 ENCOUNTER — Encounter: Payer: Self-pay | Admitting: Medical Oncology

## 2016-08-06 DIAGNOSIS — Z886 Allergy status to analgesic agent status: Secondary | ICD-10-CM | POA: Insufficient documentation

## 2016-08-06 DIAGNOSIS — Z880 Allergy status to penicillin: Secondary | ICD-10-CM | POA: Insufficient documentation

## 2016-08-06 DIAGNOSIS — F17210 Nicotine dependence, cigarettes, uncomplicated: Secondary | ICD-10-CM | POA: Insufficient documentation

## 2016-08-06 DIAGNOSIS — M5412 Radiculopathy, cervical region: Secondary | ICD-10-CM | POA: Insufficient documentation

## 2016-08-06 DIAGNOSIS — I10 Essential (primary) hypertension: Secondary | ICD-10-CM | POA: Insufficient documentation

## 2016-08-06 DIAGNOSIS — E785 Hyperlipidemia, unspecified: Secondary | ICD-10-CM | POA: Insufficient documentation

## 2016-08-06 MED ORDER — METHYLPREDNISOLONE SODIUM SUCC 125 MG IJ SOLR
80.0000 mg | Freq: Once | INTRAMUSCULAR | Status: AC
  Administered 2016-08-06: 80 mg via INTRAMUSCULAR
  Filled 2016-08-06: qty 2

## 2016-08-06 MED ORDER — TRAMADOL HCL 50 MG PO TABS: 50.0000 mg | ORAL_TABLET | Freq: Four times a day (QID) | ORAL | 0 refills | Status: DC | PRN

## 2016-08-06 MED ORDER — METHYLPREDNISOLONE 4 MG PO TBPK: ORAL_TABLET | ORAL | 0 refills | Status: DC

## 2016-08-06 MED ORDER — ORPHENADRINE CITRATE 30 MG/ML IJ SOLN
60.0000 mg | Freq: Two times a day (BID) | INTRAMUSCULAR | Status: DC
  Administered 2016-08-06: 60 mg via INTRAMUSCULAR
  Filled 2016-08-06: qty 2

## 2016-08-06 MED ORDER — CYCLOBENZAPRINE HCL 10 MG PO TABS: 10.0000 mg | ORAL_TABLET | Freq: Three times a day (TID) | ORAL | 0 refills | Status: DC | PRN

## 2016-08-06 MED ORDER — HYDROMORPHONE HCL 1 MG/ML IJ SOLN
1.0000 mg | Freq: Once | INTRAMUSCULAR | Status: AC
  Administered 2016-08-06: 1 mg via INTRAMUSCULAR
  Filled 2016-08-06: qty 1

## 2016-08-06 NOTE — ED Provider Notes (Signed)
[facility] Emergency Department Provider Note   ____________________________________________   First MD Initiated Contact with Patient 08/06/16 [phone]     (approximate)  I have reviewed the triage vital signs and the nursing notes.   HISTORY  Chief Complaint Back Pain and Neck Pain    HPI Erika Downs is a 55 y.o. female patient complaining of neck and upper back pain which began yesterday. Patient denies any injury. Patient stated pain increases with movement. Patient described a pain as "electric shocks" to her upper back. Denies loss of function of the upper extremities. She rates the pain as a 10 over 10. No palliative measures taken for this complaint.   Past Medical History:  Diagnosis Date  . Chest pain   . Hyperlipidemia     Patient Active Problem List   Diagnosis Date Noted  . Chest pain 09/25/2011    Past Surgical History:  Procedure Laterality Date  . BLADDER SUSPENSION    . NOVASURE ABLATION      Prior to Admission medications   Medication Sig Start Date End Date Taking? Authorizing Provider  cyclobenzaprine (FLEXERIL) 10 MG tablet Take 1 tablet (10 mg total) by mouth 3 (three) times daily as needed. 08/06/16   Joni Reiningonald K Smith, PA-C  methylPREDNISolone (MEDROL DOSEPAK) 4 MG TBPK tablet Take Tapered dose as directed 08/06/16   Joni Reiningonald K Smith, PA-C  traMADol (ULTRAM) 50 MG tablet Take 1 tablet (50 mg total) by mouth every 6 (six) hours as needed. 08/06/16 08/06/17  Joni Reiningonald K Smith, PA-C    Allergies Ibuprofen and Penicillins  No family history on file.  Social History Social History  Substance Use Topics  . Smoking status: Current Every Day Smoker    Packs/day: 0.50    Types: Cigarettes  . Smokeless tobacco: Never Used  . Alcohol use Yes    Review of Systems Constitutional: No fever/chills Eyes: No visual changes. ENT: No sore throat. Cardiovascular: Denies chest pain. Respiratory: Denies shortness of  breath. Gastrointestinal: No abdominal pain.  No nausea, no vomiting.  No diarrhea.  No constipation. Genitourinary: Negative for dysuria. Musculoskeletal: Negative for back pain. Skin: Negative for rash. Neurological: Negative for headaches, focal weakness or numbness. Endocrine:Hypertension hyperlipidemia. Allergic/Immunilogical: Penicillin and ibuprofen.   ____________________________________________   PHYSICAL EXAM:  VITAL SIGNS: ED Triage Vitals [08/06/16 0844]  Enc Vitals Group     BP (!) 156/47     Pulse Rate 66     Resp 19     Temp 97.4 F (36.3 C)     Temp Source Oral     SpO2 97 %     Weight 155 lb (70.3 kg)     Height 5\' 2"  (1.575 m)     Head Circumference      Peak Flow      Pain Score 10     Pain Loc      Pain Edu?      Excl. in GC?     Constitutional: Alert and oriented.Moderate distress Eyes: Conjunctivae are normal. PERRL. EOMI. Head: Atraumatic. Nose: No congestion/rhinnorhea. Mouth/Throat: Mucous membranes are moist.  Oropharynx non-erythematous. Neck: No stridor.  Cervical l spine tenderness to palpation. C4-C6. Hematological/Lymphatic/Immunilogical: No cervical lymphadenopathy. Cardiovascular: Normal rate, regular rhythm. Grossly normal heart sounds.  Good peripheral circulation. Limited blood pressure Respiratory: Normal respiratory effort.  No retractions. Lungs CTAB. Gastrointestinal: Soft and nontender. No distention. No abdominal bruits. No CVA tenderness. Musculoskeletal: No lower extremity tenderness nor edema.  No joint effusions. Neurologic:  Normal speech and language. No gross focal neurologic deficits are appreciated. No gait instability. Skin:  Skin is warm, dry and intact. No rash noted. Psychiatric: Mood and affect are normal. Speech and behavior are normal.  ____________________________________________   LABS (all labs ordered are listed, but only abnormal results are displayed)  Labs Reviewed - No data to  display ____________________________________________  EKG  ____________________________________________  RADIOLOGY  No acute findings cervical spine x-ray. There is disc space narrowing from C3-C6. ____________________________________________   PROCEDURES  Procedure(s) performed: None  Procedures  Critical Care performed: No  ____________________________________________   INITIAL IMPRESSION / ASSESSMENT AND PLAN / ED COURSE  Pertinent labs & imaging results that were available during my care of the patient were reviewed by me and considered in my medical decision making (see chart for details).  Cervical radiculopathy. Patient given discharge care instructions. Patient given prescription for Medrol Dosepak. Flexeril and tramadol. Patient advised follow "clinic for continued care.  Clinical Course    Status post IM injection of Dilaudid, Solu-Medrol, and Robaxin patient states pain more than 80% resolved.  ____________________________________________   FINAL CLINICAL IMPRESSION(S) / ED DIAGNOSES  Final diagnoses:  Cervical radiculopathy      NEW MEDICATIONS STARTED DURING THIS VISIT:  New Prescriptions   CYCLOBENZAPRINE (FLEXERIL) 10 MG TABLET    Take 1 tablet (10 mg total) by mouth 3 (three) times daily as needed.   METHYLPREDNISOLONE (MEDROL DOSEPAK) 4 MG TBPK TABLET    Take Tapered dose as directed   TRAMADOL (ULTRAM) 50 MG TABLET    Take 1 tablet (50 mg total) by mouth every 6 (six) hours as needed.     Note:  This document was prepared using Dragon voice recognition software and may include unintentional dictation errors.    Joni ReiningRonald K Smith, PA-C 08/06/16 1015    Myrna Blazeravid Matthew Schaevitz, MD 08/06/16 [phone]

## 2016-08-06 NOTE — ED Triage Notes (Addendum)
Pt reports that she began having upper back pain with pain in neck also yesterday. Pt denies injury. Pain worsens with movement.

## 2016-08-30 ENCOUNTER — Emergency Department
Admission: EM | Admit: 2016-08-30 | Discharge: 2016-08-30 | Disposition: A | Discharge status: Home / Self Care | Payer: Self-pay | Attending: Emergency Medicine | Admitting: Emergency Medicine

## 2016-08-30 DIAGNOSIS — X500XXA Overexertion from strenuous movement or load, initial encounter: Secondary | ICD-10-CM | POA: Insufficient documentation

## 2016-08-30 DIAGNOSIS — Y929 Unspecified place or not applicable: Secondary | ICD-10-CM | POA: Insufficient documentation

## 2016-08-30 DIAGNOSIS — S39012A Strain of muscle, fascia and tendon of lower back, initial encounter: Secondary | ICD-10-CM | POA: Insufficient documentation

## 2016-08-30 DIAGNOSIS — Y9389 Activity, other specified: Secondary | ICD-10-CM | POA: Insufficient documentation

## 2016-08-30 DIAGNOSIS — Y999 Unspecified external cause status: Secondary | ICD-10-CM | POA: Insufficient documentation

## 2016-08-30 DIAGNOSIS — F17210 Nicotine dependence, cigarettes, uncomplicated: Secondary | ICD-10-CM | POA: Insufficient documentation

## 2016-08-30 MED ORDER — CYCLOBENZAPRINE HCL 10 MG PO TABS: 10.0000 mg | ORAL_TABLET | Freq: Three times a day (TID) | ORAL | 0 refills | Status: DC | PRN

## 2016-08-30 MED ORDER — HYDROMORPHONE HCL 1 MG/ML IJ SOLN
1.0000 mg | Freq: Once | INTRAMUSCULAR | Status: AC
  Administered 2016-08-30: 1 mg via INTRAMUSCULAR
  Filled 2016-08-30: qty 1

## 2016-08-30 MED ORDER — DIAZEPAM 2 MG PO TABS
2.0000 mg | ORAL_TABLET | Freq: Once | ORAL | Status: AC
  Administered 2016-08-30: 2 mg via ORAL
  Filled 2016-08-30: qty 1

## 2016-08-30 MED ORDER — OXYCODONE-ACETAMINOPHEN 5-325 MG PO TABS: 1.0000 | ORAL_TABLET | ORAL | 0 refills | Status: DC | PRN

## 2016-08-30 NOTE — ED Provider Notes (Signed)
[facility] Emergency Department Provider Note ____________________________________________  Time seen: Approximately 1:27 PM  I have reviewed the triage vital signs and the nursing notes.   HISTORY  Chief Complaint Back Pain    HPI Erika Downs is a 55 y.o. female who presents to the emergency department for severe lower back pain. She states that she lifted a large, heavy television yesterday and has had pain since. No relief with tylenol.   Past Medical History:  Diagnosis Date  . Chest pain   . Hyperlipidemia     Patient Active Problem List   Diagnosis Date Noted  . Chest pain 09/25/2011    Past Surgical History:  Procedure Laterality Date  . BLADDER SUSPENSION    . NOVASURE ABLATION      Prior to Admission medications   Medication Sig Start Date End Date Taking? Authorizing Provider  cyclobenzaprine (FLEXERIL) 10 MG tablet Take 1 tablet (10 mg total) by mouth 3 (three) times daily as needed for muscle spasms. 08/30/16   Chinita Pesterari B Devyn Sheerin, FNP  oxyCODONE-acetaminophen (ROXICET) 5-325 MG tablet Take 1 tablet by mouth every 4 (four) hours as needed for severe pain. 08/30/16   Chinita Pesterari B Randen Kauth, FNP    Allergies Ibuprofen and Penicillins  No family history on file.  Social History Social History  Substance Use Topics  . Smoking status: Current Every Day Smoker    Packs/day: 0.50    Types: Cigarettes  . Smokeless tobacco: Never Used  . Alcohol use Yes    Review of Systems Constitutional: No recent illness. Cardiovascular: Denies chest pain or palpitations. Respiratory: Denies shortness of breath. Musculoskeletal: Pain in lower back Skin: Negative for rash, wound, lesion. Neurological: Negative for focal weakness or numbness. Negative for loss of bowel or bladder function.   ____________________________________________   PHYSICAL EXAM:  VITAL SIGNS: ED Triage Vitals  Enc Vitals Group     BP 08/30/16 0947 (!) 176/97     Pulse  Rate 08/30/16 0947 77     Resp 08/30/16 0947 (!) 22     Temp 08/30/16 0947 97.8 F (36.6 C)     Temp Source 08/30/16 0947 Oral     SpO2 08/30/16 0947 100 %     Weight 08/30/16 0946 155 lb (70.3 kg)     Height 08/30/16 0946 5\' 2"  (1.575 m)     Head Circumference --      Peak Flow --      Pain Score 08/30/16 0946 10     Pain Loc --      Pain Edu? --      Excl. in GC? --     Constitutional: Alert and oriented. Well appearing and in no acute distress. Eyes: Conjunctivae are normal. EOMI. Head: Atraumatic. Neck: No stridor.  Respiratory: Normal respiratory effort.   Musculoskeletal: Paraspinal lumbar tenderness to palpation on exam. No midline tenderness. Full ROM of the back is possible, but patient reports pain.  Neurologic:  Normal speech and language. No gross focal neurologic deficits are appreciated. Speech is normal. No gait instability. Skin:  Skin is warm, dry and intact. Atraumatic. Psychiatric: Mood and affect are normal. Speech and behavior are normal.  ____________________________________________   LABS (all labs ordered are listed, but only abnormal results are displayed)  Labs Reviewed - No data to display ____________________________________________  RADIOLOGY  Not indicated. ____________________________________________   PROCEDURES  Procedure(s) performed: None   ____________________________________________   INITIAL IMPRESSION / ASSESSMENT AND PLAN / ED COURSE  Clinical Course  Pertinent labs & imaging results that were available during my care of the patient were reviewed by me and considered in my medical decision making (see chart for details).  Patient given IM Dilaudid and PO Valium with significant relief while in the ER. She was given prescriptions for flexeril and percocet and advised to follow up with orthopedics for symptoms that are not improving over the week. She was advised to return to the ER for symptoms that change or worsen if  unable to schedule an appointment. ____________________________________________   FINAL CLINICAL IMPRESSION(S) / ED DIAGNOSES  Final diagnoses:  Strain of lumbar region, initial encounter       Chinita PesterCari B Soniya Ashraf, FNP 08/30/16 1404    Jene Everyobert Kinner, MD 08/31/16 1704

## 2016-08-30 NOTE — ED Triage Notes (Signed)
Back pain after lifting heavy TV yesterday. Pt ambulatory, worsening pain while sitting.

## 2016-08-30 NOTE — ED Notes (Signed)
See triage note  States she developed pain to lower back which moves to mid back since yesterday  States she felt the pain after lifting a heavy TV  Increased pain with sitting and ambulation ambulates with limp d/t pain

## 2016-12-10 ENCOUNTER — Emergency Department: Payer: Self-pay

## 2016-12-10 ENCOUNTER — Emergency Department
Admission: EM | Admit: 2016-12-10 | Discharge: 2016-12-10 | Disposition: A | Discharge status: Home / Self Care | Payer: Self-pay | Attending: Emergency Medicine | Admitting: Emergency Medicine

## 2016-12-10 DIAGNOSIS — R079 Chest pain, unspecified: Secondary | ICD-10-CM | POA: Insufficient documentation

## 2016-12-10 DIAGNOSIS — R0602 Shortness of breath: Secondary | ICD-10-CM | POA: Insufficient documentation

## 2016-12-10 DIAGNOSIS — F17210 Nicotine dependence, cigarettes, uncomplicated: Secondary | ICD-10-CM | POA: Insufficient documentation

## 2016-12-10 LAB — LIPID PANEL
CHOL/HDL RATIO: 5.2 ratio
CHOLESTEROL: 228 mg/dL — AB (ref 0–200)
HDL: 44 mg/dL (ref >40)
LDL Cholesterol: 164 mg/dL — ABNORMAL HIGH (ref 0–99)
Triglycerides: 99 mg/dL (ref <150)
VLDL: 20 mg/dL (ref 0–40)

## 2016-12-10 LAB — BASIC METABOLIC PANEL
ANION GAP: 10 (ref 5–15)
BUN: 15 mg/dL (ref 6–20)
CHLORIDE: 105 mmol/L (ref 101–111)
CO2: 25 mmol/L (ref 22–32)
Calcium: 9.3 mg/dL (ref 8.9–10.3)
Creatinine, Ser: 0.63 mg/dL (ref 0.44–1.00)
Glucose, Bld: 86 mg/dL (ref 65–99)
POTASSIUM: 4.2 mmol/L (ref 3.5–5.1)
SODIUM: 140 mmol/L (ref 135–145)

## 2016-12-10 LAB — CBC
HEMATOCRIT: 45.8 % (ref 35.0–47.0)
HEMOGLOBIN: 16.2 g/dL — AB (ref 12.0–16.0)
MCH: 32 pg (ref 26.0–34.0)
MCHC: 35.3 g/dL (ref 32.0–36.0)
MCV: 90.8 fL (ref 80.0–100.0)
Platelets: 313 10*3/uL (ref 150–440)
RBC: 5.05 MIL/uL (ref 3.80–5.20)
RDW: 13.1 % (ref 11.5–14.5)
WBC: 7.4 10*3/uL (ref 3.6–11.0)

## 2016-12-10 LAB — FIBRIN DERIVATIVES D-DIMER (ARMC ONLY): FIBRIN DERIVATIVES D-DIMER (ARMC): 871.7 — AB (ref 0.00–499.00)

## 2016-12-10 LAB — TROPONIN I: Troponin I: <0.03 ng/mL (ref <0.03)

## 2016-12-10 MED ORDER — DIAZEPAM 5 MG PO TABS: 5.0000 mg | ORAL_TABLET | Freq: Three times a day (TID) | ORAL | 0 refills | Status: DC | PRN

## 2016-12-10 MED ORDER — IPRATROPIUM-ALBUTEROL 0.5-2.5 (3) MG/3ML IN SOLN
RESPIRATORY_TRACT | Status: AC
  Administered 2016-12-10: 3 mL via RESPIRATORY_TRACT
  Filled 2016-12-10: qty 3

## 2016-12-10 MED ORDER — IOPAMIDOL (ISOVUE-370) INJECTION 76%
75.0000 mL | Freq: Once | INTRAVENOUS | Status: AC | PRN
  Administered 2016-12-10: 75 mL via INTRAVENOUS

## 2016-12-10 MED ORDER — SIMVASTATIN 10 MG PO TABS: 10.0000 mg | ORAL_TABLET | Freq: Every evening | ORAL | 11 refills | Status: DC

## 2016-12-10 MED ORDER — IPRATROPIUM-ALBUTEROL 0.5-2.5 (3) MG/3ML IN SOLN
3.0000 mL | Freq: Once | RESPIRATORY_TRACT | Status: AC
  Administered 2016-12-10: 3 mL via RESPIRATORY_TRACT

## 2016-12-10 MED ORDER — OXYCODONE-ACETAMINOPHEN 5-325 MG PO TABS
2.0000 | ORAL_TABLET | Freq: Once | ORAL | Status: AC
  Administered 2016-12-10: 2 via ORAL
  Filled 2016-12-10: qty 2

## 2016-12-10 NOTE — ED Triage Notes (Signed)
Pt reports non radiating central chest pain that worsens when she lays down. Pt reports the pain began last night. Pt reports cough for several weeks.

## 2016-12-10 NOTE — ED Notes (Signed)
Patient transported to CT 

## 2016-12-10 NOTE — ED Notes (Signed)
ED Provider at bedside. 

## 2016-12-10 NOTE — ED Provider Notes (Signed)
[facility] Emergency Department Provider Note        Time seen: ----------------------------------------- 11:26 AM on 12/10/2016 -----------------------------------------    I have reviewed the triage vital signs and the nursing notes.   HISTORY  Chief Complaint Chest Pain    HPI Erika Downs is a 56 y.o. female who presents to ER for nonradiating central chest pain that worsens when she lays down. Patient reports pain that began last night she has had a cough for several weeks. Currently the pain is 10 out of 10.Pain she states is across her whole chest and worsened when she lays down but is not worsened by anything else. She has not had a productive cough but has had a cough nonetheless. She denies fevers, chills, vomiting or diarrhea. She has never had symptoms like this before.   Past Medical History:  Diagnosis Date  . Chest pain   . Hyperlipidemia     Patient Active Problem List   Diagnosis Date Noted  . Chest pain 09/25/2011    Past Surgical History:  Procedure Laterality Date  . BLADDER SUSPENSION    . NOVASURE ABLATION      Allergies Ibuprofen and Penicillins  Social History Social History  Substance Use Topics  . Smoking status: Current Every Day Smoker    Packs/day: 0.50    Types: Cigarettes  . Smokeless tobacco: Never Used  . Alcohol use Yes    Review of Systems Constitutional: Negative for fever. Cardiovascular: Positive for chest pain Respiratory: Positive for shortness of breath Gastrointestinal: Negative for abdominal pain, vomiting and diarrhea. Genitourinary: Negative for dysuria. Musculoskeletal: Negative for back pain. Skin: Negative for rash. Neurological: Negative for headaches, focal weakness or numbness.  10-point ROS otherwise negative.  ____________________________________________   PHYSICAL EXAM:  VITAL SIGNS: ED Triage Vitals  Enc Vitals Group     BP 12/10/16 1005 (!) 164/104     Pulse  Rate 12/10/16 1005 71     Resp 12/10/16 1005 20     Temp 12/10/16 1005 97.6 F (36.4 C)     Temp Source 12/10/16 1005 Oral     SpO2 12/10/16 1005 100 %     Weight 12/10/16 1019 165 lb (74.8 kg)     Height 12/10/16 1019 5\' 2"  (1.575 m)     Head Circumference --      Peak Flow --      Pain Score 12/10/16 1019 10     Pain Loc --      Pain Edu? --      Excl. in GC? --     Constitutional: Alert and oriented. Well appearing and in no distress. Eyes: Conjunctivae are normal. PERRL. Normal extraocular movements. ENT   Head: Normocephalic and atraumatic.   Nose: No congestion/rhinnorhea.   Mouth/Throat: Mucous membranes are moist.   Neck: No stridor. Cardiovascular: Normal rate, regular rhythm. No murmurs, rubs, or gallops. Respiratory: Normal respiratory effort without tachypnea nor retractions. Breath sounds are clear and equal bilaterally. No wheezes/rales/rhonchi. Gastrointestinal: Soft and nontender. Normal bowel sounds Musculoskeletal: Nontender with normal range of motion in all extremities. No lower extremity tenderness nor edema. Neurologic:  Normal speech and language. No gross focal neurologic deficits are appreciated.  Skin:  Skin is warm, dry and intact. No rash noted. Psychiatric: Mood and affect are normal. Speech and behavior are normal.  ____________________________________________  EKG: Interpreted by me. Sinus rhythm with a rate of 66 bpm, normal PR interval, normal QRS, normal QT, normal axis.  ____________________________________________  ED COURSE:  Pertinent labs & imaging results that were available during my care of the patient were reviewed by me and considered in my medical decision making (see chart for details). Patient presents to ER with chest pain of uncertain etiology. We will assess with labs and imaging.   Procedures ____________________________________________   LABS (pertinent positives/negatives)  Labs Reviewed  CBC - Abnormal;  Notable for the following:       Result Value   Hemoglobin 16.2 (*)    All other components within normal limits  FIBRIN DERIVATIVES D-DIMER ([facility] ONLY) - Abnormal; Notable for the following:    Fibrin derivatives D-dimer ([facility]) 871.70 (*)    All other components within normal limits  LIPID PANEL - Abnormal; Notable for the following:    Cholesterol 228 (*)    LDL Cholesterol 164 (*)    All other components within normal limits  BASIC METABOLIC PANEL  TROPONIN I  TROPONIN I    RADIOLOGY  Chest x-ray is normal IMPRESSION: No evidence of pulmonary embolism or other active disease within the thorax.  Aortic atherosclerosis. ____________________________________________  FINAL ASSESSMENT AND PLAN  Chest pain, shortness of breath, Hyperlipidemia  Plan: Patient with labs and imaging as dictated above. No clear etiology for symptoms. She'll be discharged with Motrin and Valium. We'll start her on simvastatin. She is stable for outpatient follow-up with her doctor.   Emily Filbert, MD   Note: This note was generated in part or whole with voice recognition software. Voice recognition is usually quite accurate but there are transcription errors that can and very often do occur. I apologize for any typographical errors that were not detected and corrected.     Emily Filbert, MD 12/10/16 [phone]

## 2016-12-14 ENCOUNTER — Telehealth: Payer: Self-pay

## 2016-12-14 DIAGNOSIS — Z720 Tobacco use: Secondary | ICD-10-CM | POA: Diagnosis present

## 2016-12-14 NOTE — Telephone Encounter (Signed)
Tried to call patient to make ED follow up appointment  [facility] [place] "I am at [place] at the moment I do not need to talk to you" before I could ask when I could call her back she hang up on me.  Will try again at a later time she was seen on 12/10/16 for CP

## 2016-12-21 NOTE — Telephone Encounter (Signed)
Pt hung up on me and yell at the phone stating she would "rather die than come back, that is the [facility]"

## 2017-04-13 ENCOUNTER — Encounter: Payer: Self-pay | Admitting: Emergency Medicine

## 2017-04-13 ENCOUNTER — Emergency Department
Admission: EM | Admit: 2017-04-13 | Discharge: 2017-04-14 | Disposition: A | Discharge status: Home / Self Care | Payer: Self-pay | Attending: Emergency Medicine | Admitting: Emergency Medicine

## 2017-04-13 ENCOUNTER — Emergency Department: Payer: Self-pay

## 2017-04-13 DIAGNOSIS — R0789 Other chest pain: Secondary | ICD-10-CM | POA: Insufficient documentation

## 2017-04-13 DIAGNOSIS — F17210 Nicotine dependence, cigarettes, uncomplicated: Secondary | ICD-10-CM | POA: Insufficient documentation

## 2017-04-13 LAB — BASIC METABOLIC PANEL
ANION GAP: 8 (ref 5–15)
BUN: 22 mg/dL — ABNORMAL HIGH (ref 6–20)
CALCIUM: 9.3 mg/dL (ref 8.9–10.3)
CO2: 25 mmol/L (ref 22–32)
Chloride: 106 mmol/L (ref 101–111)
Creatinine, Ser: 0.63 mg/dL (ref 0.44–1.00)
GFR calc non Af Amer: >60 mL/min (ref >60)
Glucose, Bld: 92 mg/dL (ref 65–99)
Potassium: 4 mmol/L (ref 3.5–5.1)
Sodium: 139 mmol/L (ref 135–145)

## 2017-04-13 LAB — CBC
HCT: 45.7 % (ref 35.0–47.0)
HEMOGLOBIN: 15.6 g/dL (ref 12.0–16.0)
MCH: 31.5 pg (ref 26.0–34.0)
MCHC: 34.1 g/dL (ref 32.0–36.0)
MCV: 92.2 fL (ref 80.0–100.0)
Platelets: 277 10*3/uL (ref 150–440)
RBC: 4.96 MIL/uL (ref 3.80–5.20)
RDW: 13.4 % (ref 11.5–14.5)
WBC: 8.1 10*3/uL (ref 3.6–11.0)

## 2017-04-13 LAB — TROPONIN I

## 2017-04-13 MED ORDER — OXYCODONE-ACETAMINOPHEN 5-325 MG PO TABS
2.0000 | ORAL_TABLET | Freq: Once | ORAL | Status: AC
  Administered 2017-04-13: 2 via ORAL
  Filled 2017-04-13: qty 2

## 2017-04-13 NOTE — ED Provider Notes (Signed)
[facility] Emergency Department Provider Note       Time seen: ----------------------------------------- 9:35 PM on 04/13/2017 -----------------------------------------     I have reviewed the triage vital signs and the nursing notes.   HISTORY   Chief Complaint Chest Pain    HPI Erika Downs is a 56 y.o. female who presents to the ED for constant central chest pain that started on Friday. Patient states is painful to touch and hurts worse when she takes deep breath. Patient was seen several months ago for similar both here and at [facility] without a specific diagnosis. She denies fevers, chills, or other complaints. She denies any specific chest wall trauma.   Past Medical History:  Diagnosis Date  . Chest pain   . Hyperlipidemia     Patient Active Problem List   Diagnosis Date Noted  . Chest pain 09/25/2011    Past Surgical History:  Procedure Laterality Date  . BLADDER SUSPENSION    . NOVASURE ABLATION      Allergies Ibuprofen and Penicillins  Social History Social History  Substance Use Topics  . Smoking status: Current Every Day Smoker    Packs/day: 0.50    Types: Cigarettes  . Smokeless tobacco: Never Used  . Alcohol use Yes    Review of Systems Constitutional: Negative for fever. Eyes: Negative for vision changes ENT:  Negative for congestion, sore throat Cardiovascular: Positive for chest pain Respiratory: Negative for shortness of breath. Gastrointestinal: Negative for abdominal pain, vomiting and diarrhea. Genitourinary: Negative for dysuria. Musculoskeletal: Negative for back pain. Skin: Negative for rash. Neurological: Negative for headaches, focal weakness or numbness.  All systems negative/normal/unremarkable except as stated in the HPI  ____________________________________________   PHYSICAL EXAM:  VITAL SIGNS: ED Triage Vitals [04/13/17 2055]  Enc Vitals Group     BP (!) 148/88     Pulse Rate 88      Resp 17     Temp 98 F (36.7 C)     Temp Source Oral     SpO2 97 %     Weight 165 lb (74.8 kg)     Height      Head Circumference      Peak Flow      Pain Score      Pain Loc      Pain Edu?      Excl. in GC?     Constitutional: Alert and oriented. Well appearing and in no distress. Eyes: Conjunctivae are normal. Normal extraocular movements. ENT   Head: Normocephalic and atraumatic.   Nose: No congestion/rhinnorhea.   Mouth/Throat: Mucous membranes are moist.   Neck: No stridor. Cardiovascular: Normal rate, regular rhythm. No murmurs, rubs, or gallops. Respiratory: Normal respiratory effort without tachypnea nor retractions. Breath sounds are clear and equal bilaterally. No wheezes/rales/rhonchi. Gastrointestinal: Soft and nontender. Normal bowel sounds Musculoskeletal: Nontender with normal range of motion in extremities. No lower extremity tenderness nor edema. Exquisite right parasternal chest wall tenderness Neurologic:  Normal speech and language. No gross focal neurologic deficits are appreciated.  Skin:  Skin is warm, dry and intact. No rash noted. Psychiatric: Mood and affect are normal. Speech and behavior are normal.  ____________________________________________  EKG: Interpreted by me. Sinus rhythm with a rate of 61 bpm, normal PR, normal QRS, normal QT.  ____________________________________________  ED COURSE:  Pertinent labs & imaging results that were available during my care of the patient were reviewed by me and considered in my medical decision making (see chart for  details). Patient presents for chest pain, we will assess with labs and imaging as indicated.   Procedures ____________________________________________   LABS (pertinent positives/negatives)  Labs Reviewed  BASIC METABOLIC PANEL - Abnormal; Notable for the following:       Result Value   BUN 22 (*)    All other components within normal limits  CBC  TROPONIN I  TROPONIN I     RADIOLOGY  Chest x-ray is normal  ____________________________________________  FINAL ASSESSMENT AND PLAN  Chest x-ray  Plan: Patient's labs and imaging were dictated above. Patient had presented for Chest pain which appears to be musculoskeletal in origin. Repeat troponin is pending at this time.   Emily FilbertWilliams, Jonathan E, MD   Note: This note was generated in part or whole with voice recognition software. Voice recognition is usually quite accurate but there are transcription errors that can and very often do occur. I apologize for any typographical errors that were not detected and corrected.     Emily FilbertWilliams, Jonathan E, MD 04/13/17 2229

## 2017-04-13 NOTE — ED Triage Notes (Signed)
Pt ambulatory to triage with steady gait, no distress noted. Pt c/o of constant central chest pain that started on Friday. Pt sts its painful to the touch and and hurts worse when she takes a deep breath. Pt denies N/V.

## 2017-04-14 MED ORDER — OXYCODONE HCL 5 MG PO TABS
5.0000 mg | ORAL_TABLET | Freq: Once | ORAL | Status: AC
  Administered 2017-04-14: 5 mg via ORAL
  Filled 2017-04-14: qty 1

## 2017-04-14 NOTE — ED Provider Notes (Signed)
Assume care of the patient 11:00 PM from Dr. Mayford KnifeWilliams. Recommendation repeat troponin negative patient was able to be discharged home. Repeat troponin negative and a such patient will be referred to outpatient cardiology.   Darci CurrentBrown, [place] N, MD 04/14/17 [phone]

## 2017-05-20 ENCOUNTER — Encounter: Payer: Self-pay | Admitting: Emergency Medicine

## 2017-05-20 ENCOUNTER — Emergency Department: Payer: Self-pay

## 2017-05-20 ENCOUNTER — Emergency Department
Admission: EM | Admit: 2017-05-20 | Discharge: 2017-05-20 | Disposition: A | Discharge status: Home / Self Care | Payer: Self-pay | Attending: Emergency Medicine | Admitting: Emergency Medicine

## 2017-05-20 DIAGNOSIS — M898X1 Other specified disorders of bone, shoulder: Secondary | ICD-10-CM | POA: Insufficient documentation

## 2017-05-20 DIAGNOSIS — T148XXA Other injury of unspecified body region, initial encounter: Secondary | ICD-10-CM

## 2017-05-20 DIAGNOSIS — F17210 Nicotine dependence, cigarettes, uncomplicated: Secondary | ICD-10-CM | POA: Insufficient documentation

## 2017-05-20 DIAGNOSIS — Z79899 Other long term (current) drug therapy: Secondary | ICD-10-CM | POA: Insufficient documentation

## 2017-05-20 MED ORDER — CYCLOBENZAPRINE HCL 10 MG PO TABS
5.0000 mg | ORAL_TABLET | Freq: Once | ORAL | Status: AC
  Administered 2017-05-20: 5 mg via ORAL
  Filled 2017-05-20: qty 1

## 2017-05-20 MED ORDER — IBUPROFEN 600 MG PO TABS: 600.0000 mg | ORAL_TABLET | Freq: Four times a day (QID) | ORAL | 0 refills | Status: DC | PRN

## 2017-05-20 MED ORDER — OXYCODONE-ACETAMINOPHEN 5-325 MG PO TABS
1.0000 | ORAL_TABLET | Freq: Once | ORAL | Status: AC
  Administered 2017-05-20: 1 via ORAL
  Filled 2017-05-20: qty 1

## 2017-05-20 MED ORDER — LIDOCAINE 5 % EX PTCH
1.0000 | MEDICATED_PATCH | CUTANEOUS | Status: DC
  Administered 2017-05-20: 1 via TRANSDERMAL
  Filled 2017-05-20: qty 1

## 2017-05-20 MED ORDER — CYCLOBENZAPRINE HCL 5 MG PO TABS: 5.0000 mg | ORAL_TABLET | Freq: Three times a day (TID) | ORAL | 0 refills | Status: AC | PRN

## 2017-05-20 MED ORDER — OXYCODONE-ACETAMINOPHEN 5-325 MG PO TABS: 1.0000 | ORAL_TABLET | Freq: Four times a day (QID) | ORAL | 0 refills | Status: DC | PRN

## 2017-05-20 NOTE — ED Notes (Signed)
See triage note  Presents with posterior right shoulder pain since early this am  Denies any specific injury  No deformity noted  Pain increased with movement

## 2017-05-20 NOTE — ED Notes (Signed)
Pt in xray

## 2017-05-20 NOTE — ED Triage Notes (Addendum)
Pt reports right shoulder pain that began this morning. Pt reports difficulty lifting arm over her head. Denies injury.No obvious deformity noted. Pt ambulatory to triage. No apparent distress noted.

## 2017-05-20 NOTE — ED Provider Notes (Signed)
[facility] Emergency Department Provider Note  ____________________________________________  Time seen: Approximately 6:32 PM  I have reviewed the triage vital signs and the nursing notes.   HISTORY  Chief Complaint Shoulder Pain    HPI Erika Downs is a 56 y.o. female that presents to the emergency department for evaluation of right scapular pain. Patient states that she has had surgery on his right shoulder 10 years ago and was told that in timeher scapula would push back. She woke up this morning with a lot of pain behind her shoulder and is wondering if she slept on her shoulder wrong. Pain is worse with moving her right arm. No injury. No shortness of breath, chest pain, nausea, vomiting, abdominal pain, numbness, tingling.   Past Medical History:  Diagnosis Date  . Chest pain   . Hyperlipidemia     Patient Active Problem List   Diagnosis Date Noted  . Chest pain 09/25/2011    Past Surgical History:  Procedure Laterality Date  . BLADDER SUSPENSION    . NOVASURE ABLATION      Prior to Admission medications   Medication Sig Start Date End Date Taking? Authorizing Provider  cyclobenzaprine (FLEXERIL) 5 MG tablet Take 1 tablet (5 mg total) by mouth 3 (three) times daily as needed for muscle spasms. 05/20/17 05/27/17  Enid Derry, PA-C  diazepam (VALIUM) 5 MG tablet Take 1 tablet (5 mg total) by mouth every 8 (eight) hours as needed for muscle spasms. 12/10/16   Emily Filbert, MD  ibuprofen (ADVIL,MOTRIN) 600 MG tablet Take 1 tablet (600 mg total) by mouth every 6 (six) hours as needed. 05/20/17   Enid Derry, PA-C  oxyCODONE-acetaminophen (ROXICET) 5-325 MG tablet Take 1 tablet by mouth every 6 (six) hours as needed. 05/20/17 05/20/18  Enid Derry, PA-C  simvastatin (ZOCOR) 10 MG tablet Take 1 tablet (10 mg total) by mouth every evening. 12/10/16 12/10/17  Emily Filbert, MD    Allergies Ibuprofen and Penicillins  No family  history on file.  Social History Social History  Substance Use Topics  . Smoking status: Current Every Day Smoker    Packs/day: 0.50    Types: Cigarettes  . Smokeless tobacco: Never Used  . Alcohol use Yes     Review of Systems  Constitutional: No fever/chills Cardiovascular: No chest pain. Respiratory: No SOB. Gastrointestinal: No abdominal pain.  No nausea, no vomiting.  Musculoskeletal: Positive for upper back pain. Skin: Negative for rash, abrasions, lacerations, ecchymosis. Neurological: Negative for headaches, numbness or tingling   ____________________________________________   PHYSICAL EXAM:  VITAL SIGNS: ED Triage Vitals  Enc Vitals Group     BP 05/20/17 1749 (!) 186/85     Pulse Rate 05/20/17 1749 (!) 59     Resp 05/20/17 1749 18     Temp 05/20/17 1749 97.6 F (36.4 C)     Temp Source 05/20/17 1749 Oral     SpO2 05/20/17 1749 100 %     Weight 05/20/17 1750 160 lb (72.6 kg)     Height 05/20/17 1750 5\' 2"  (1.575 m)     Head Circumference --      Peak Flow --      Pain Score 05/20/17 1749 10     Pain Loc --      Pain Edu? --      Excl. in GC? --      Constitutional: Alert and oriented. Well appearing and in no acute distress. Eyes: Conjunctivae are normal. PERRL. EOMI.  Head: Atraumatic. ENT:      Ears:      Nose: No congestion/rhinnorhea.      Mouth/Throat: Mucous membranes are moist.  Neck: No stridor.  No cervical spine tenderness to palpation. Cardiovascular: Normal rate, regular rhythm.  Good peripheral circulation. Respiratory: Normal respiratory effort without tachypnea or retractions. Lungs CTAB. Good air entry to the bases with no decreased or absent breath sounds. Musculoskeletal: No gross deformities appreciated. Hypersensitive to palpation over right scapula. No tenderness to palpation over right shoulder. Pain in upper back with right arm movement. No erythema or bruising.  Neurologic:  Normal speech and language. No gross focal  neurologic deficits are appreciated.  Skin:  Skin is warm, dry and intact. No rash noted.   ____________________________________________   LABS (all labs ordered are listed, but only abnormal results are displayed)  Labs Reviewed - No data to display ____________________________________________  EKG   ____________________________________________  RADIOLOGY Lexine Baton, personally viewed and evaluated these images (plain radiographs) as part of my medical decision making, as well as reviewing the written report by the radiologist.  No results found.   DG Clavicle/Scapula IMPRESSION:  Chronic changes to the distal clavicle. No acute abnormalities.      __________________________________________    PROCEDURES  Procedure(s) performed:    Procedures    Medications  oxyCODONE-acetaminophen (PERCOCET/ROXICET) 5-325 MG per tablet 1 tablet (1 tablet Oral Given 05/20/17 1824)  cyclobenzaprine (FLEXERIL) tablet 5 mg (5 mg Oral Given 05/20/17 1904)     ____________________________________________   INITIAL IMPRESSION / ASSESSMENT AND PLAN / ED COURSE  Pertinent labs & imaging results that were available during my care of the patient were reviewed by me and considered in my medical decision making (see chart for details).  Review of the [place] CSRS was performed in accordance of the [facility] prior to dispensing any controlled drugs.  Patient presented to the emergency department for evaluation of right upper back pain. Vital signs and exam are reassuring. Clavicle/scapula x-ray negative for acute changes. Pain is likely musculoskeletal. Patient will be discharged home with prescriptions for ibuprofen. Patient is to follow up with orthopedics as directed. Patient is given ED precautions to return to the ED for any worsening or new symptoms.   ____________________________________________  FINAL CLINICAL IMPRESSION(S) / ED DIAGNOSES  Final diagnoses:  Pain in scapula   Muscle strain      NEW MEDICATIONS STARTED DURING THIS VISIT:  Discharge Medication List as of 05/20/2017  6:59 PM    START taking these medications   Details  ibuprofen (ADVIL,MOTRIN) 600 MG tablet Take 1 tablet (600 mg total) by mouth every 6 (six) hours as needed., Starting Thu 05/20/2017, Print            This chart was dictated using voice recognition software/Dragon. Despite best efforts to proofread, errors can occur which can change the meaning. Any change was purely unintentional.    Enid Derry, PA-C 05/21/17 2019    Loleta Rose, MD 05/21/17 2052

## 2017-09-02 ENCOUNTER — Emergency Department: Payer: Self-pay

## 2017-09-02 ENCOUNTER — Other Ambulatory Visit: Payer: Self-pay

## 2017-09-02 ENCOUNTER — Encounter: Payer: Self-pay | Admitting: Emergency Medicine

## 2017-09-02 ENCOUNTER — Emergency Department
Admission: EM | Admit: 2017-09-02 | Discharge: 2017-09-02 | Disposition: A | Discharge status: Home / Self Care | Payer: Self-pay | Attending: Emergency Medicine | Admitting: Emergency Medicine

## 2017-09-02 DIAGNOSIS — R079 Chest pain, unspecified: Secondary | ICD-10-CM | POA: Insufficient documentation

## 2017-09-02 DIAGNOSIS — Z5321 Procedure and treatment not carried out due to patient leaving prior to being seen by health care provider: Secondary | ICD-10-CM | POA: Insufficient documentation

## 2017-09-02 LAB — BASIC METABOLIC PANEL
Anion gap: 10 (ref 5–15)
BUN: 19 mg/dL (ref 6–20)
CALCIUM: 9.4 mg/dL (ref 8.9–10.3)
CO2: 25 mmol/L (ref 22–32)
Chloride: 104 mmol/L (ref 101–111)
Creatinine, Ser: 0.6 mg/dL (ref 0.44–1.00)
GFR calc Af Amer: >60 mL/min (ref >60)
GLUCOSE: 92 mg/dL (ref 65–99)
Potassium: 4 mmol/L (ref 3.5–5.1)
SODIUM: 139 mmol/L (ref 135–145)

## 2017-09-02 LAB — CBC
HCT: 45 % (ref 35.0–47.0)
Hemoglobin: 15.6 g/dL (ref 12.0–16.0)
MCH: 32.2 pg (ref 26.0–34.0)
MCHC: 34.6 g/dL (ref 32.0–36.0)
MCV: 92.9 fL (ref 80.0–100.0)
PLATELETS: 255 10*3/uL (ref 150–440)
RBC: 4.84 MIL/uL (ref 3.80–5.20)
RDW: 13 % (ref 11.5–14.5)
WBC: 6.4 10*3/uL (ref 3.6–11.0)

## 2017-09-02 LAB — TROPONIN I

## 2017-09-02 NOTE — ED Triage Notes (Signed)
Pt reports chest pain for months but states has been getting worse overnight. Pt states the pain got worse after "getting into it" with her oldest daughter.  Pt states the pain is sharp and is intermittent. Pain is "past a 10" Pt also reports nausea, dizziness and lightheadedness.  Pt does not take any ASA or medication for anxiety at this time.

## 2017-09-02 NOTE — ED Notes (Signed)
Patient called x2 to be roomed.  No answer from patient at this time.

## 2017-09-05 ENCOUNTER — Telehealth: Payer: Self-pay | Admitting: Emergency Medicine

## 2017-09-05 NOTE — Telephone Encounter (Signed)
Called patient due to lwot to inquire about condition and follow up plans. Says unavailable and no voicemail.

## 2018-04-01 ENCOUNTER — Other Ambulatory Visit: Payer: Self-pay

## 2018-04-01 ENCOUNTER — Emergency Department
Admission: EM | Admit: 2018-04-01 | Discharge: 2018-04-01 | Disposition: A | Discharge status: Home / Self Care | Payer: Self-pay | Attending: Emergency Medicine | Admitting: Emergency Medicine

## 2018-04-01 ENCOUNTER — Emergency Department: Payer: Self-pay

## 2018-04-01 ENCOUNTER — Encounter: Payer: Self-pay | Admitting: Emergency Medicine

## 2018-04-01 DIAGNOSIS — F17210 Nicotine dependence, cigarettes, uncomplicated: Secondary | ICD-10-CM | POA: Insufficient documentation

## 2018-04-01 DIAGNOSIS — R0789 Other chest pain: Secondary | ICD-10-CM | POA: Insufficient documentation

## 2018-04-01 DIAGNOSIS — N644 Mastodynia: Secondary | ICD-10-CM | POA: Insufficient documentation

## 2018-04-01 LAB — CBC
HCT: 43.5 % (ref 35.0–47.0)
Hemoglobin: 15.3 g/dL (ref 12.0–16.0)
MCH: 32.5 pg (ref 26.0–34.0)
MCHC: 35.1 g/dL (ref 32.0–36.0)
MCV: 92.6 fL (ref 80.0–100.0)
PLATELETS: 301 10*3/uL (ref 150–440)
RBC: 4.7 MIL/uL (ref 3.80–5.20)
RDW: 13.7 % (ref 11.5–14.5)
WBC: 6.5 10*3/uL (ref 3.6–11.0)

## 2018-04-01 LAB — BASIC METABOLIC PANEL
Anion gap: 11 (ref 5–15)
BUN: 16 mg/dL (ref 6–20)
CHLORIDE: 108 mmol/L (ref 98–111)
CO2: 23 mmol/L (ref 22–32)
CREATININE: 0.48 mg/dL (ref 0.44–1.00)
Calcium: 9.3 mg/dL (ref 8.9–10.3)
GFR calc Af Amer: >60 mL/min (ref >60)
GFR calc non Af Amer: >60 mL/min (ref >60)
GLUCOSE: 92 mg/dL (ref 70–99)
POTASSIUM: 3.9 mmol/L (ref 3.5–5.1)
SODIUM: 142 mmol/L (ref 135–145)

## 2018-04-01 LAB — POCT PREGNANCY, URINE: Preg Test, Ur: NEGATIVE

## 2018-04-01 LAB — TROPONIN I: Troponin I: <0.03 ng/mL (ref <0.03)

## 2018-04-01 MED ORDER — ONDANSETRON HCL 4 MG/2ML IJ SOLN
4.0000 mg | Freq: Once | INTRAMUSCULAR | Status: AC
  Administered 2018-04-01: 4 mg via INTRAVENOUS
  Filled 2018-04-01: qty 2

## 2018-04-01 MED ORDER — HYDROMORPHONE HCL 1 MG/ML IJ SOLN
1.0000 mg | Freq: Once | INTRAMUSCULAR | Status: AC
  Administered 2018-04-01: 1 mg via INTRAVENOUS
  Filled 2018-04-01: qty 1

## 2018-04-01 MED ORDER — OXYCODONE-ACETAMINOPHEN 5-325 MG PO TABS: 1.0000 | ORAL_TABLET | ORAL | 0 refills | Status: AC | PRN

## 2018-04-01 MED ORDER — IOHEXOL 300 MG/ML  SOLN: 75.0000 mL | Freq: Once | INTRAMUSCULAR | Status: DC | PRN

## 2018-04-01 NOTE — ED Notes (Signed)
First Nurse Note: Patient to Room 8.  [facility] aware of room placement.

## 2018-04-01 NOTE — Discharge Instructions (Signed)
ER for new, worsening, persistent severe pain, difficulty breathing, weakness or lightheadedness, fever, rash, or any other new or worsening symptoms that concern you.  Follow-up with your regular doctor.

## 2018-04-01 NOTE — ED Notes (Signed)
Patient transported to Ultrasound 

## 2018-04-01 NOTE — ED Notes (Signed)
Pt placed on 2L BNC due to continuing O2 sats in the 80's. Pt now at 100%.

## 2018-04-01 NOTE — ED Notes (Signed)
Pt not able to sign due to not having any available portable computers. Pt verbalizes understanding prescriptions and follow up care. PT ambulated to lobby with husband.

## 2018-04-01 NOTE — ED Triage Notes (Signed)
Pt to ED via POV stating that she has been having pain in the right breast since Wednesday. Pain states that the pain is consistent. Pt denies any associated symptoms. Pt is in NAD at this time.

## 2018-04-01 NOTE — ED Notes (Signed)
Pt presents with right breast pain since Wednesday. Pt is writhing and crying in pain during assessment. She denies injury; small bruise noted to side of right breast, but pt states the pain is not there. Skin appears to be slightly red, but is red on left breast as well. Pt alert & oriented with NAD noted.

## 2018-04-01 NOTE — ED Provider Notes (Addendum)
[facility] Emergency Department Provider Note ____________________________________________   First MD Initiated Contact with Patient 04/01/18 1101     (approximate)  I have reviewed the triage vital signs and the nursing notes.   HISTORY  Chief Complaint Breast Pain    HPI Erika Downs is a 57 y.o. female with PMH as noted below who presents with acute onset of right-sided breast pain 2 days ago, worsening course, and not associated with any rash or swelling.  No prior history of this pain.  She denies any trauma or injury.  No shortness of breath, weakness or dizziness, or vomiting.  Past Medical History:  Diagnosis Date  . Chest pain   . Hyperlipidemia     Patient Active Problem List   Diagnosis Date Noted  . Chest pain 09/25/2011    Past Surgical History:  Procedure Laterality Date  . BLADDER SUSPENSION    . NOVASURE ABLATION      Prior to Admission medications   Medication Sig Start Date End Date Taking? Authorizing Provider  diazepam (VALIUM) 5 MG tablet Take 1 tablet (5 mg total) by mouth every 8 (eight) hours as needed for muscle spasms. Patient not taking: Reported on 04/01/2018 12/10/16   Emily Filbert, MD  ibuprofen (ADVIL,MOTRIN) 600 MG tablet Take 1 tablet (600 mg total) by mouth every 6 (six) hours as needed. Patient not taking: Reported on 04/01/2018 05/20/17   Enid Derry, PA-C  oxyCODONE-acetaminophen (PERCOCET) 5-325 MG tablet Take 1 tablet by mouth every 4 (four) hours as needed for up to 5 days for severe pain. 04/01/18 04/06/18  Dionne Bucy, MD    Allergies Ibuprofen; Ivp dye [iodinated diagnostic agents]; and Penicillins  No family history on file.  Social History Social History   Tobacco Use  . Smoking status: Current Every Day Smoker    Packs/day: 0.50    Types: Cigarettes  . Smokeless tobacco: Never Used  Substance Use Topics  . Alcohol use: Yes  . Drug use: No    Review of  Systems  Constitutional: No fever. Eyes: No redness. ENT: No neck pain. Cardiovascular: Denies chest pain. Respiratory: Denies shortness of breath. Gastrointestinal: No vomiting Genitourinary: Negative for flank pain.  Musculoskeletal: Negative for back pain. Skin: Negative for rash. Neurological: Negative for headache.   ____________________________________________   PHYSICAL EXAM:  VITAL SIGNS: ED Triage Vitals  Enc Vitals Group     BP 04/01/18 1007 (!) 149/102     Pulse Rate 04/01/18 1007 60     Resp 04/01/18 1007 16     Temp 04/01/18 1007 97.8 F (36.6 C)     Temp Source 04/01/18 1007 Oral     SpO2 04/01/18 1007 96 %     Weight 04/01/18 1009 150 lb (68 kg)     Height 04/01/18 1009 5\' 2"  (1.575 m)     Head Circumference --      Peak Flow --      Pain Score 04/01/18 1009 10     Pain Loc --      Pain Edu? --      Excl. in GC? --     Constitutional: Alert and oriented.  Uncomfortable appearing but in no acute distress. Eyes: Conjunctivae are normal.  Head: Atraumatic. Nose: No congestion/rhinnorhea. Mouth/Throat: Mucous membranes are moist.   Neck: Normal range of motion.  Cardiovascular: Normal rate, regular rhythm. Grossly normal heart sounds.  Good peripheral circulation. Respiratory: Normal respiratory effort.  No retractions. Lungs CTAB. Gastrointestinal: Soft  and nontender. No distention.  Genitourinary: No flank tenderness. Musculoskeletal:  Extremities warm and well perfused.  Neurologic:  Normal speech and language. No gross focal neurologic deficits are appreciated.  Skin:  Skin is warm and dry. No rash noted.  Patient's breasts are somewhat pendulous.  Right upper breast adjacent to the chest wall with tenderness and localized pain, but no swelling, erythema, induration, or warmth.  No palpable masses.  No rash or abnormal cutaneous findings underneath bilateral breasts. Psychiatric: Mood and affect are normal. Speech and behavior are  normal.  ____________________________________________   LABS (all labs ordered are listed, but only abnormal results are displayed)  Labs Reviewed  BASIC METABOLIC PANEL  CBC  TROPONIN I  POC URINE PREG, ED  POCT PREGNANCY, URINE   ____________________________________________  EKG  ED ECG REPORT I, Dionne Bucy, the attending physician, personally viewed and interpreted this ECG.  Date: 04/01/2018 EKG Time: 1012 Rate: 57 Rhythm: normal sinus rhythm QRS Axis: normal Intervals: normal ST/T Wave abnormalities: normal Narrative Interpretation: no evidence of acute ischemia  ____________________________________________  RADIOLOGY  CXR: No acute abnormalities CT chest: No acute abnormalities  ____________________________________________   PROCEDURES  Procedure(s) performed: No  Procedures  Critical Care performed: No ____________________________________________   INITIAL IMPRESSION / ASSESSMENT AND PLAN / ED COURSE  Pertinent labs & imaging results that were available during my care of the patient were reviewed by me and considered in my medical decision making (see chart for details).  57 year old female with PMH as noted above presents with acute onset several days ago of right-sided severe breast pain with no swelling, rash, or other cutaneous findings.  No trauma to the area.  No significant cardio pulmonary symptoms.  On exam, the patient is uncomfortable but relatively well-appearing.  Vital signs are normal except for slight hypertension.  Exam is as described above with some localized tenderness but no cutaneous findings, no swelling, no palpable masses.  EKG is nonischemic.  Overall cause of patient's breast pain is unclear.  There does not seem to be any evidence of breast abscess or mastitis.  Given the lack of associated symptoms I do not suspect cardiac cause and there is no evidence for DVT or PE.  There is no trauma to explain the pain  either.  Initial labs obtained from triage are normal.  I initially considered ultrasound, however after speaking to the radiology technician given that this would only rule in or out abscess without necessarily evaluating for other findings, I elected to obtain a CT instead.  The patient has an allergy to IV contrast, so will be performed without contrast.  If the work-up is negative, I will reassess the patient's clinical status and determine if additional tests are needed or if patient is safe for discharge home.  ----------------------------------------- 2:11 PM on 04/01/2018 -----------------------------------------  CT shows no acute findings.  The patient's pain is significantly improved.  The patient now states she thinks she might have pulled a muscle in the chest wall.  This is entirely consistent with the patient's symptoms.  The lab work-up is also unremarkable, so I do not suspect infectious cause.  I discussed the results of the work-up with the patient.  She feels well to go home.  Return precautions given, and the patient expresses understanding.  ____________________________________________   FINAL CLINICAL IMPRESSION(S) / ED DIAGNOSES  Final diagnoses:  Breast pain  Chest wall pain      NEW MEDICATIONS STARTED DURING THIS VISIT:  Discharge Medication List as of  04/01/2018  2:10 PM       Note:  This document was prepared using Dragon voice recognition software and may include unintentional dictation errors.    Dionne BucySiadecki, Azana Kiesler, MD 04/01/18 [id number]    Dionne BucySiadecki, Terralyn Matsumura, MD 04/01/18 [id number]

## 2020-02-16 ENCOUNTER — Emergency Department
Admission: EM | Admit: 2020-02-16 | Discharge: 2020-02-16 | Disposition: A | Discharge status: Home / Self Care | Payer: Self-pay | Attending: Emergency Medicine | Admitting: Emergency Medicine

## 2020-02-16 ENCOUNTER — Emergency Department: Payer: Self-pay

## 2020-02-16 ENCOUNTER — Other Ambulatory Visit: Payer: Self-pay

## 2020-02-16 DIAGNOSIS — F17210 Nicotine dependence, cigarettes, uncomplicated: Secondary | ICD-10-CM | POA: Insufficient documentation

## 2020-02-16 DIAGNOSIS — N3001 Acute cystitis with hematuria: Secondary | ICD-10-CM | POA: Insufficient documentation

## 2020-02-16 LAB — CBC
HCT: 47.4 % — ABNORMAL HIGH (ref 36.0–46.0)
Hemoglobin: 15.5 g/dL — ABNORMAL HIGH (ref 12.0–15.0)
MCH: 31.2 pg (ref 26.0–34.0)
MCHC: 32.7 g/dL (ref 30.0–36.0)
MCV: 95.4 fL (ref 80.0–100.0)
Platelets: 306 10*3/uL (ref 150–400)
RBC: 4.97 MIL/uL (ref 3.87–5.11)
RDW: 13.3 % (ref 11.5–15.5)
WBC: 8 10*3/uL (ref 4.0–10.5)
nRBC: 0 % (ref 0.0–0.2)

## 2020-02-16 LAB — COMPREHENSIVE METABOLIC PANEL
ALT: 10 U/L (ref 0–44)
AST: 17 U/L (ref 15–41)
Albumin: 3.9 g/dL (ref 3.5–5.0)
Alkaline Phosphatase: 81 U/L (ref 38–126)
Anion gap: 9 (ref 5–15)
BUN: 13 mg/dL (ref 6–20)
CO2: 25 mmol/L (ref 22–32)
Calcium: 9 mg/dL (ref 8.9–10.3)
Chloride: 106 mmol/L (ref 98–111)
Creatinine, Ser: 0.49 mg/dL (ref 0.44–1.00)
GFR calc Af Amer: >60 mL/min (ref >60)
GFR calc non Af Amer: >60 mL/min (ref >60)
Glucose, Bld: 83 mg/dL (ref 70–99)
Potassium: 4.7 mmol/L (ref 3.5–5.1)
Sodium: 140 mmol/L (ref 135–145)
Total Bilirubin: 0.3 mg/dL (ref 0.3–1.2)
Total Protein: 7.6 g/dL (ref 6.5–8.1)

## 2020-02-16 LAB — URINALYSIS, COMPLETE (UACMP) WITH MICROSCOPIC
Bilirubin Urine: NEGATIVE
Glucose, UA: NEGATIVE mg/dL
Ketones, ur: NEGATIVE mg/dL
Nitrite: NEGATIVE
Protein, ur: 30 mg/dL — AB
RBC / HPF: >50 RBC/hpf — ABNORMAL HIGH (ref 0–5)
Specific Gravity, Urine: 1.011 (ref 1.005–1.030)
WBC, UA: >50 WBC/hpf — ABNORMAL HIGH (ref 0–5)
pH: 5 (ref 5.0–8.0)

## 2020-02-16 LAB — LIPASE, BLOOD: Lipase: 31 U/L (ref 11–51)

## 2020-02-16 MED ORDER — OXYCODONE HCL 5 MG PO TABS
5.0000 mg | ORAL_TABLET | Freq: Once | ORAL | Status: AC
  Administered 2020-02-16: 5 mg via ORAL
  Filled 2020-02-16: qty 1

## 2020-02-16 MED ORDER — PHENAZOPYRIDINE HCL 100 MG PO TABS: 100.0000 mg | ORAL_TABLET | Freq: Three times a day (TID) | ORAL | 0 refills | Status: AC | PRN

## 2020-02-16 MED ORDER — SULFAMETHOXAZOLE-TRIMETHOPRIM 800-160 MG PO TABS: 1.0000 | ORAL_TABLET | Freq: Two times a day (BID) | ORAL | 0 refills | Status: AC

## 2020-02-16 NOTE — ED Notes (Signed)
37ml in bladder for bladder scan.

## 2020-02-16 NOTE — ED Provider Notes (Signed)
[facility] Emergency Department Provider Note  ____________________________________________   First MD Initiated Contact with Patient 02/16/20 1203     (approximate)  I have reviewed the triage vital signs and the nursing notes.   HISTORY  Chief Complaint Abdominal Pain    HPI Erika Downs is a 59 y.o. female with history of bladder prolapse status post sling who presents for lower abdominal pain.  Patient states since yesterday she is felt a discomfort over her bladder.  She reports having difficult getting her urine out and it burning when she urinates, intermittently, 2 days, nothing makes it better, nothing makes it worse.  She denies any known history of kidney stones.  Denies any chest pain, shortness of breath. Denies any hematuria           Past Medical History:  Diagnosis Date  . Chest pain   . Hyperlipidemia     Patient Active Problem List   Diagnosis Date Noted  . Chest pain 09/25/2011    Past Surgical History:  Procedure Laterality Date  . BLADDER SUSPENSION    . NOVASURE ABLATION      Prior to Admission medications   Medication Sig Start Date End Date Taking? Authorizing Provider  diazepam (VALIUM) 5 MG tablet Take 1 tablet (5 mg total) by mouth every 8 (eight) hours as needed for muscle spasms. Patient not taking: Reported on 04/01/2018 12/10/16   Emily Filbert, MD  ibuprofen (ADVIL,MOTRIN) 600 MG tablet Take 1 tablet (600 mg total) by mouth every 6 (six) hours as needed. Patient not taking: Reported on 04/01/2018 05/20/17   Enid Derry, PA-C    Allergies Ibuprofen, Ivp dye [iodinated diagnostic agents], and Penicillins  No family history on file.  Social History Social History   Tobacco Use  . Smoking status: Current Every Day Smoker    Packs/day: 0.50    Types: Cigarettes  . Smokeless tobacco: Never Used  Substance Use Topics  . Alcohol use: Yes  . Drug use: No      Review of  Systems Constitutional: No fever/chills Eyes: No visual changes. ENT: No sore throat. Cardiovascular: Denies chest pain. Respiratory: Denies shortness of breath. Gastrointestina positive dysuria, denies hematuria, suprapubic abdominal pain Musculoskeletal: Negative for back pain. Skin: Negative for rash. Neurological: Negative for headaches, focal weakness or numbness. All other ROS negative ____________________________________________   PHYSICAL EXAM:  VITAL SIGNS: ED Triage Vitals [02/16/20 0916]  Enc Vitals Group     BP (!) 192/91     Pulse Rate 76     Resp 16     Temp 98 F (36.7 C)     Temp Source Oral     SpO2 99 %     Weight 149 lb 14.6 oz (68 kg)     Height 5\' 2"  (1.575 m)     Head Circumference      Peak Flow      Pain Score 10     Pain Loc      Pain Edu?      Excl. in GC?     Constitutional: Alert and oriented. Well appearing and in no acute distress. Eyes: Conjunctivae are normal. EOMI. Head: Atraumatic. Nose: No congestion/rhinnorhea. Mouth/Throat: Mucous membranes are moist.   Neck: No stridor. Trachea Midline. FROM Cardiovascular: Normal rate, regular rhythm. Grossly normal heart sounds.  Good peripheral circulation. Respiratory: Normal respiratory effort.  No retractions. Lungs CTAB. Gastrointestinal: Suprapubic abdominal pain.  No distention. No abdominal bruits.  Musculoskeletal: No lower  extremity tenderness nor edema.  No joint effusions. Neurologic:  Normal speech and language. No gross focal neurologic deficits are appreciated.  Skin:  Skin is warm, dry and intact. No rash noted. Psychiatric: Mood and affect are normal. Speech and behavior are normal. GU: Deferred   ____________________________________________   LABS (all labs ordered are listed, but only abnormal results are displayed)  Labs Reviewed  CBC - Abnormal; Notable for the following components:      Result Value   Hemoglobin 15.5 (*)    HCT 47.4 (*)    All other components  within normal limits  URINALYSIS, COMPLETE (UACMP) WITH MICROSCOPIC - Abnormal; Notable for the following components:   Color, Urine YELLOW (*)    APPearance CLOUDY (*)    Hgb urine dipstick LARGE (*)    Protein, ur 30 (*)    Leukocytes,Ua LARGE (*)    RBC / HPF >50 (*)    WBC, UA >50 (*)    Bacteria, UA RARE (*)    All other components within normal limits  URINE CULTURE  LIPASE, BLOOD  COMPREHENSIVE METABOLIC PANEL   ____________________________________________   INITIAL IMPRESSION / ASSESSMENT AND PLAN / ED COURSE  Erika Downs was evaluated in Emergency Department on 02/16/2020 for the symptoms described in the history of present illness. She was evaluated in the context of the global COVID-19 pandemic, which necessitated consideration that the patient might be at risk for infection with the SARS-CoV-2 virus that causes COVID-19. Institutional protocols and algorithms that pertain to the evaluation of patients at risk for COVID-19 are in a state of rapid change based on information released by regulatory bodies including the CDC and federal and state organizations. These policies and algorithms were followed during the patient's care in the ED.    Patient is a 59 year old status post sling who comes in with lower abdominal pain, burning when she urinates and getting urine out.  Given patient has significant of RBCs and WBCs in her urine get CT renal to make sure no evidence of kidney stones as well as to evaluate for the bladder placement.  Will get bladder scan to make sure there is no signs of retaining.  Will get labs to evaluate for Electra abnormalities, AKI  IMPRESSION:  1. No acute abdominal/pelvic findings, mass lesions or adenopathy.  2. No renal, ureteral or bladder calculi or mass.  3. Moderate age advanced atherosclerotic calcifications involving  the aorta and iliac arteries.  4. Small hiatal hernia.   Bladder scan 61- no signs of retention.   CT scan  re-assuring  D/w pt that I could do pelvic exam as well to check and make sure no prolapse but pt declined given re-assuring Ct scan.  Culture sent, most likely symptoms from UTI. Not septic.  Will rx with pyridium and bactrim 3 days and pt will f/u with PCP for culture check and given urology referral for future bladder issues.  ____________________________________________   FINAL CLINICAL IMPRESSION(S) / ED DIAGNOSES   Final diagnoses:  Acute cystitis with hematuria      MEDICATIONS GIVEN DURING THIS VISIT:  Medications  oxyCODONE (Oxy IR/ROXICODONE) immediate release tablet 5 mg (5 mg Oral Given 02/16/20 1307)     ED Discharge Orders         Ordered    phenazopyridine (PYRIDIUM) 100 MG tablet  3 times daily PRN     02/16/20 1315    sulfamethoxazole-trimethoprim (BACTRIM DS) 800-160 MG tablet  2 times daily  02/16/20 1315           Note:  This document was prepared using Dragon voice recognition software and may include unintentional dictation errors.   Vanessa [place], MD 02/16/20 1324

## 2020-02-16 NOTE — ED Triage Notes (Addendum)
Reports pelvic pain, pressure and scant amounts of urine coming out with extreme pain. Pain began yesterday. Pt alert and oriented X4, cooperative, RR even and unlabored, color WNL. Pt in NAD.   "afraid my bladder has fallen again" hx of surgery for this in 2010

## 2020-02-16 NOTE — Discharge Instructions (Signed)
Your CT scan is as below.  I suspect that this is from a UTI.  We start you on some Pyridium helps with pain and some Bactrim to help with the UTI.  Should follow-up with your culture results in 2 to 3 days to make sure it was sensitive to the Bactrim if your symptoms are not resolving.  You can follow-up with urology if you continue to have any issues with your sling or any other concerns.  Return to the ER for worsening symptoms, fevers or any other concerns  IMPRESSION:  1. No acute abdominal/pelvic findings, mass lesions or adenopathy.  2. No renal, ureteral or bladder calculi or mass.  3. Moderate age advanced atherosclerotic calcifications involving  the aorta and iliac arteries.  4. Small hiatal hernia.

## 2020-02-16 NOTE — ED Notes (Signed)
Pt in CT.

## 2020-02-16 NOTE — ED Notes (Signed)
Pt ambulatory to toilet with steady gait.  C/o bladder pressure. States urinated twice since being in ER. A&O.

## 2020-02-19 LAB — URINE CULTURE: Culture: >=100000 — AB

## 2021-09-22 ENCOUNTER — Other Ambulatory Visit: Payer: Self-pay

## 2021-09-22 ENCOUNTER — Emergency Department: Payer: Self-pay

## 2021-09-22 ENCOUNTER — Emergency Department
Admission: EM | Admit: 2021-09-22 | Discharge: 2021-09-22 | Disposition: A | Discharge status: Home / Self Care | Payer: Self-pay | Attending: Emergency Medicine | Admitting: Emergency Medicine

## 2021-09-22 DIAGNOSIS — K2900 Acute gastritis without bleeding: Secondary | ICD-10-CM

## 2021-09-22 DIAGNOSIS — R079 Chest pain, unspecified: Secondary | ICD-10-CM | POA: Insufficient documentation

## 2021-09-22 DIAGNOSIS — F17210 Nicotine dependence, cigarettes, uncomplicated: Secondary | ICD-10-CM | POA: Insufficient documentation

## 2021-09-22 LAB — CBC
HCT: 47.4 % — ABNORMAL HIGH (ref 36.0–46.0)
Hemoglobin: 15.7 g/dL — ABNORMAL HIGH (ref 12.0–15.0)
MCH: 30.5 pg (ref 26.0–34.0)
MCHC: 33.1 g/dL (ref 30.0–36.0)
MCV: 92.2 fL (ref 80.0–100.0)
Platelets: 398 10*3/uL (ref 150–400)
RBC: 5.14 MIL/uL — ABNORMAL HIGH (ref 3.87–5.11)
RDW: 12.6 % (ref 11.5–15.5)
WBC: 8.5 10*3/uL (ref 4.0–10.5)
nRBC: 0 % (ref 0.0–0.2)

## 2021-09-22 LAB — BASIC METABOLIC PANEL
Anion gap: 8 (ref 5–15)
BUN: 22 mg/dL — ABNORMAL HIGH (ref 6–20)
CO2: 27 mmol/L (ref 22–32)
Calcium: 9.3 mg/dL (ref 8.9–10.3)
Chloride: 101 mmol/L (ref 98–111)
Creatinine, Ser: 0.68 mg/dL (ref 0.44–1.00)
GFR, Estimated: >60 mL/min (ref >60)
Glucose, Bld: 108 mg/dL — ABNORMAL HIGH (ref 70–99)
Potassium: 3.7 mmol/L (ref 3.5–5.1)
Sodium: 136 mmol/L (ref 135–145)

## 2021-09-22 LAB — SEDIMENTATION RATE: Sed Rate: 20 mm/hr (ref 0–30)

## 2021-09-22 LAB — TROPONIN I (HIGH SENSITIVITY)
Troponin I (High Sensitivity): 7 ng/L (ref <18)
Troponin I (High Sensitivity): 7 ng/L (ref <18)

## 2021-09-22 MED ORDER — FAMOTIDINE 20 MG PO TABS
40.0000 mg | ORAL_TABLET | Freq: Once | ORAL | Status: AC
  Administered 2021-09-22: 17:00:00 40 mg via ORAL
  Filled 2021-09-22: qty 2

## 2021-09-22 MED ORDER — SUCRALFATE 1 G PO TABS: 1.0000 g | ORAL_TABLET | Freq: Four times a day (QID) | ORAL | 1 refills | Status: DC

## 2021-09-22 MED ORDER — METOCLOPRAMIDE HCL 10 MG PO TABS: 10.0000 mg | ORAL_TABLET | Freq: Four times a day (QID) | ORAL | 0 refills | Status: DC | PRN

## 2021-09-22 MED ORDER — FAMOTIDINE 20 MG PO TABS: 20.0000 mg | ORAL_TABLET | Freq: Two times a day (BID) | ORAL | 0 refills | Status: DC

## 2021-09-22 MED ORDER — ASPIRIN 81 MG PO CHEW
325.0000 mg | CHEWABLE_TABLET | Freq: Every day | ORAL | Status: DC
  Administered 2021-09-22: 15:00:00 324 mg via ORAL
  Filled 2021-09-22: qty 5

## 2021-09-22 MED ORDER — SUCRALFATE 1 G PO TABS
1.0000 g | ORAL_TABLET | Freq: Once | ORAL | Status: AC
  Administered 2021-09-22: 17:00:00 1 g via ORAL
  Filled 2021-09-22: qty 1

## 2021-09-22 MED ORDER — METOCLOPRAMIDE HCL 10 MG PO TABS
10.0000 mg | ORAL_TABLET | Freq: Once | ORAL | Status: AC
  Administered 2021-09-22: 17:00:00 10 mg via ORAL
  Filled 2021-09-22: qty 1

## 2021-09-22 NOTE — ED Notes (Signed)
Unable to obtain signature at time of triage due to signature pad not working.  

## 2021-09-22 NOTE — ED Triage Notes (Signed)
Pt via POV from home c/o severe left upper chest pain with radiation to back and nausea/emesis. No prior hx MI, unknown cardiac history and pt states she has not been to a doctor in years. Pt appears uncomfortable in triage.

## 2021-09-22 NOTE — ED Provider Notes (Signed)
Procedures     ----------------------------------------- 5:09 PM on 09/22/2021 -----------------------------------------  EKG repeated at 4:43 PM, interpreted by me  Date: 09/22/2021  Rate: 80  Rhythm: normal sinus rhythm  QRS Axis: normal  Intervals: normal  ST/T Wave abnormalities: normal  Conduction Disutrbances: none  Narrative Interpretation: unremarkable  Pain is improved.  By history, it is suggestive of gastritis.  Extensive work-up is normal including all the labs.  She does have reproducible left upper quadrant abdominal tenderness.  We will treat with Carafate Reglan and Pepcid, recommend follow-up with GI.     Sharman Cheek, MD 09/22/21 1710

## 2021-09-22 NOTE — ED Notes (Signed)
Patient Alert and oriented to baseline. Stable and ambulatory to baseline. Patient verbalized understanding of the discharge instructions.  Patient belongings were taken by the patient.   

## 2021-09-22 NOTE — ED Provider Notes (Signed)
[facility] Emergency Department Provider Note   ____________________________________________   Event Date/Time   First MD Initiated Contact with Patient 09/22/21 1434     (approximate)  I have reviewed the triage vital signs and the nursing notes.   HISTORY  Chief Complaint Chest Pain   HPI Erika Downs is a 60 y.o. female patient complains of severe chest pain radiating through to the back.  She says it is worse when she lays back better when she sits forward.  It is not made worse by deep breathing.  She is not short of breath.  She does have an occasional cough that produces some white phlegm but not very much.  She is not running a fever.  The pain is not exacerbated by movement or exercise or climbing the stairs and does not radiate anywhere else like to the jaw or the arm.  Patient reports she gets swelling and itching and hives whenever she takes ibuprofen now.  She takes [place] powders frequently though.        Past Medical History:  Diagnosis Date   Chest pain    Hyperlipidemia     Patient Active Problem List   Diagnosis Date Noted   Chest pain 09/25/2011    Past Surgical History:  Procedure Laterality Date   BLADDER SUSPENSION     NOVASURE ABLATION      Prior to Admission medications   Medication Sig Start Date End Date Taking? Authorizing Provider  diazepam (VALIUM) 5 MG tablet Take 1 tablet (5 mg total) by mouth every 8 (eight) hours as needed for muscle spasms. Patient not taking: Reported on 04/01/2018 12/10/16   Emily Filbert, MD  ibuprofen (ADVIL,MOTRIN) 600 MG tablet Take 1 tablet (600 mg total) by mouth every 6 (six) hours as needed. Patient not taking: Reported on 04/01/2018 05/20/17   Enid Derry, PA-C    Allergies Ibuprofen, Ivp dye [iodinated contrast media], and Penicillins  No family history on file.  Social History Social History   Tobacco Use   Smoking status: Every Day    Packs/day: 0.50    Types:  Cigarettes   Smokeless tobacco: Never  Vaping Use   Vaping Use: Never used  Substance Use Topics   Alcohol use: Yes   Drug use: No    Review of Systems  Constitutional: No fever/chills Eyes: No visual changes. ENT: No sore throat. Cardiovascular: chest pain. Respiratory: Denies shortness of breath. Gastrointestinal: No abdominal pain.  No nausea, no vomiting.  No diarrhea.  No constipation. Genitourinary: Negative for dysuria. Musculoskeletal: Negative for back pain. Skin: Negative for rash. Neurological: Negative for headaches, focal weakness  ____________________________________________   PHYSICAL EXAM:  VITAL SIGNS: ED Triage Vitals  Enc Vitals Group     BP 09/22/21 1214 (!) 165/98     Pulse Rate 09/22/21 1214 (!) 105     Resp 09/22/21 1214 (!) 22     Temp 09/22/21 1214 97.6 F (36.4 C)     Temp Source 09/22/21 1214 Oral     SpO2 09/22/21 1214 93 %     Weight 09/22/21 1215 135 lb (61.2 kg)     Height 09/22/21 1215 5\' 2"  (1.575 m)     Head Circumference --      Peak Flow --      Pain Score 09/22/21 1215 10     Pain Loc --      Pain Edu? --      Excl. in GC? --  Constitutional: Alert and oriented. Well appearing and in no acute distress. Eyes: Conjunctivae are normal. Head: Atraumatic. Nose: No congestion/rhinnorhea. Mouth/Throat: Mucous membranes are moist.  Oropharynx non-erythematous. Neck: No stridor.   Cardiovascular: Normal rate, regular rhythm. Grossly normal heart sounds.  Good peripheral circulation.  Patient reports chest wall is tender to palpation with this pain that she is having is different from the pain on palpation. Respiratory: Normal respiratory effort.  No retractions. Lungs CTAB. Gastrointestinal: Soft and nontender. No distention. No abdominal bruits.  Musculoskeletal: No lower extremity tenderness nor edema.  Neurologic:  Normal speech and language. No gross focal neurologic deficits are appreciated.  Skin:  Skin is warm, dry and  intact. No rash noted.   ____________________________________________   LABS (all labs ordered are listed, but only abnormal results are displayed)  Labs Reviewed  BASIC METABOLIC PANEL - Abnormal; Notable for the following components:      Result Value   Glucose, Bld 108 (*)    BUN 22 (*)    All other components within normal limits  CBC - Abnormal; Notable for the following components:   RBC 5.14 (*)    Hemoglobin 15.7 (*)    HCT 47.4 (*)    All other components within normal limits  SEDIMENTATION RATE  POC URINE PREG, ED  TROPONIN I (HIGH SENSITIVITY)  TROPONIN I (HIGH SENSITIVITY)   ____________________________________________  EKG  EKG read interpreted by me shows normal sinus rhythm rate of 89 normal axis no acute ST-T wave changes ____________________________________________  RADIOLOGY Gertha Calkin, personally viewed and evaluated these images (plain radiographs) as part of my medical decision making, as well as reviewing the written report by the radiologist.  ED MD interpretation: Chest x-ray read by radiology reviewed by me does not show any acute changes  Official radiology report(s): DG Chest 2 View  Result Date: 09/22/2021 CLINICAL DATA:  Chest pain EXAM: CHEST - 2 VIEW COMPARISON:  04/01/2018 FINDINGS: Calcified granuloma in the left lower lung. Heart and mediastinal contours are within normal limits. No focal opacities or effusions. No acute bony abnormality. IMPRESSION: No active cardiopulmonary disease. Electronically Signed   By: Rolm Baptise M.D.   On: 09/22/2021 12:47    ____________________________________________   PROCEDURES  Procedure(s) performed (including Critical Care):  Procedures bedside ultrasound was done.  I do not see any cardiac effusion.  Heart is contracting well.   ____________________________________________   INITIAL IMPRESSION / ASSESSMENT AND PLAN / ED COURSE  Patient's symptoms are consistent with a  pericarditis picture.  Initial EKG did not look like pericarditis.  Troponin was negative.  I will try to get a sed rate on this patient.  We will repeat another EKG to see if anything is changed over time.   ----------------------------------------- 3:35 PM on 09/22/2021 ----------------------------------------- I have signed this patient out to oncoming physician pending return of the sed rate and the second troponin and second EKG.          ____________________________________________   FINAL CLINICAL IMPRESSION(S) / ED DIAGNOSES  Final diagnoses:  Nonspecific chest pain     ED Discharge Orders     None        Note:  This document was prepared using Dragon voice recognition software and may include unintentional dictation errors.    Nena Polio, MD 09/22/21 1535

## 2021-12-28 ENCOUNTER — Emergency Department: Payer: Self-pay

## 2021-12-28 ENCOUNTER — Emergency Department
Admission: EM | Admit: 2021-12-28 | Discharge: 2021-12-29 | Disposition: A | Discharge status: Home / Self Care | Payer: Self-pay | Attending: Emergency Medicine | Admitting: Emergency Medicine

## 2021-12-28 ENCOUNTER — Other Ambulatory Visit: Payer: Self-pay

## 2021-12-28 ENCOUNTER — Encounter: Payer: Self-pay | Admitting: Radiology

## 2021-12-28 DIAGNOSIS — N390 Urinary tract infection, site not specified: Secondary | ICD-10-CM | POA: Insufficient documentation

## 2021-12-28 DIAGNOSIS — R1030 Lower abdominal pain, unspecified: Secondary | ICD-10-CM | POA: Insufficient documentation

## 2021-12-28 DIAGNOSIS — R112 Nausea with vomiting, unspecified: Secondary | ICD-10-CM | POA: Insufficient documentation

## 2021-12-28 DIAGNOSIS — M6289 Other specified disorders of muscle: Secondary | ICD-10-CM

## 2021-12-28 LAB — COMPREHENSIVE METABOLIC PANEL
ALT: 9 U/L (ref 0–44)
AST: 16 U/L (ref 15–41)
Albumin: 4.3 g/dL (ref 3.5–5.0)
Alkaline Phosphatase: 86 U/L (ref 38–126)
Anion gap: 11 (ref 5–15)
BUN: 15 mg/dL (ref 6–20)
CO2: 26 mmol/L (ref 22–32)
Calcium: 9.5 mg/dL (ref 8.9–10.3)
Chloride: 98 mmol/L (ref 98–111)
Creatinine, Ser: 0.57 mg/dL (ref 0.44–1.00)
GFR, Estimated: >60 mL/min (ref >60)
Glucose, Bld: 103 mg/dL — ABNORMAL HIGH (ref 70–99)
Potassium: 3.8 mmol/L (ref 3.5–5.1)
Sodium: 135 mmol/L (ref 135–145)
Total Bilirubin: 0.6 mg/dL (ref 0.3–1.2)
Total Protein: 8.8 g/dL — ABNORMAL HIGH (ref 6.5–8.1)

## 2021-12-28 LAB — PREGNANCY, URINE: Preg Test, Ur: NEGATIVE

## 2021-12-28 LAB — URINALYSIS, ROUTINE W REFLEX MICROSCOPIC
Glucose, UA: NEGATIVE mg/dL
Ketones, ur: 20 mg/dL — AB
Nitrite: NEGATIVE
Protein, ur: 100 mg/dL — AB
RBC / HPF: >50 RBC/hpf — ABNORMAL HIGH (ref 0–5)
Specific Gravity, Urine: 1.026 (ref 1.005–1.030)
WBC, UA: >50 WBC/hpf — ABNORMAL HIGH (ref 0–5)
pH: 5 (ref 5.0–8.0)

## 2021-12-28 LAB — CBC
HCT: 49.4 % — ABNORMAL HIGH (ref 36.0–46.0)
Hemoglobin: 16.1 g/dL — ABNORMAL HIGH (ref 12.0–15.0)
MCH: 30.1 pg (ref 26.0–34.0)
MCHC: 32.6 g/dL (ref 30.0–36.0)
MCV: 92.5 fL (ref 80.0–100.0)
Platelets: 345 10*3/uL (ref 150–400)
RBC: 5.34 MIL/uL — ABNORMAL HIGH (ref 3.87–5.11)
RDW: 15.2 % (ref 11.5–15.5)
WBC: 9 10*3/uL (ref 4.0–10.5)
nRBC: 0 % (ref 0.0–0.2)

## 2021-12-28 MED ORDER — ONDANSETRON HCL 4 MG/2ML IJ SOLN
4.0000 mg | Freq: Once | INTRAMUSCULAR | Status: AC
  Administered 2021-12-28: 4 mg via INTRAVENOUS
  Filled 2021-12-28: qty 2

## 2021-12-28 MED ORDER — HALOPERIDOL LACTATE 5 MG/ML IJ SOLN
5.0000 mg | Freq: Once | INTRAMUSCULAR | Status: AC
  Administered 2021-12-28: 5 mg via INTRAVENOUS
  Filled 2021-12-28: qty 1

## 2021-12-28 MED ORDER — CEFTRIAXONE SODIUM 1 G IJ SOLR
1.0000 g | Freq: Once | INTRAMUSCULAR | Status: AC
  Administered 2021-12-28: 1 g via INTRAVENOUS
  Filled 2021-12-28: qty 10

## 2021-12-28 MED ORDER — MORPHINE SULFATE (PF) 4 MG/ML IV SOLN
4.0000 mg | Freq: Once | INTRAVENOUS | Status: AC
Start: 2021-12-28 — End: 2021-12-28
  Administered 2021-12-28: 4 mg via INTRAVENOUS
  Filled 2021-12-28: qty 1

## 2021-12-28 NOTE — ED Notes (Signed)
Placed pt on 2LNC d/t O2 sats dropping and staying at 88.  EDP Derrill Kay notified.  O2Sat 96 at this time. ?

## 2021-12-28 NOTE — ED Notes (Signed)
Pt presents with mass in central pelvic area, tender to the touch. ?

## 2021-12-28 NOTE — ED Triage Notes (Signed)
Pt reports bladder has prolapsed causing severe pain to abd and back. Had bladder sling placed apx 10 years ago.  ?

## 2021-12-28 NOTE — ED Provider Notes (Signed)
? ?[facility] ?Provider Note ? ? ? Event Date/Time  ? First MD Initiated Contact with Patient 12/28/21 1951   ?  (approximate) ? ? ?History  ? ?Abdominal Pain ? ? ?HPI ? ?Erika Downs is a 61 y.o. female  who presents to the emergency department today because of concerns for possible bladder prolapse.  Patient states that she had a bladder sling procedure performed around 10 years ago.  She states for a number of weeks now she has felt like her bladder has been falling.  This has been accompanied by some discomfort.  She states however over the past few days the pain has been a lot worse and she feels like the bladder has fallen even further.  She states that she has had increased frequency of urination although no pain with urination.  She has had nausea and vomiting.  Patient denies any fevers.  No change in defecation. ? ?Physical Exam  ? ?Triage Vital Signs: ?ED Triage Vitals  ?Enc Vitals Group  ?   BP 12/28/21 1934 (!) 165/107  ?   Pulse Rate 12/28/21 1934 (!) 105  ?   Resp 12/28/21 1934 18  ?   Temp 12/28/21 1934 98 ?F (36.7 ?C)  ?   Temp Source 12/28/21 1934 Oral  ?   SpO2 --   ?   Weight 12/28/21 1935 130 lb (59 kg)  ?   Height 12/28/21 1935 5\' 2"  (1.575 m)  ?   Head Circumference --   ?   Peak Flow --   ?   Pain Score 12/28/21 1935 10  ? ?Most recent vital signs: ?Vitals:  ? 12/28/21 1934  ?BP: (!) 165/107  ?Pulse: (!) 105  ?Resp: 18  ?Temp: 98 ?F (36.7 ?C)  ? ?General: Awake, no distress.  ?CV:  Good peripheral perfusion.  ?Resp:  Normal effort.  ?Abd:  No distention. Tender to palpation in the suprapubic region. ? ? ?ED Results / Procedures / Treatments  ? ?Labs ?(all labs ordered are listed, but only abnormal results are displayed) ?Labs Reviewed  ?COMPREHENSIVE METABOLIC PANEL - Abnormal; Notable for the following components:  ?    Result Value  ? Glucose, Bld 103 (*)   ? Total Protein 8.8 (*)   ? All other components within normal limits  ?CBC - Abnormal; Notable for the  following components:  ? RBC 5.34 (*)   ? Hemoglobin 16.1 (*)   ? HCT 49.4 (*)   ? All other components within normal limits  ?URINALYSIS, ROUTINE W REFLEX MICROSCOPIC - Abnormal; Notable for the following components:  ? Color, Urine AMBER (*)   ? APPearance HAZY (*)   ? Hgb urine dipstick SMALL (*)   ? Bilirubin Urine SMALL (*)   ? Ketones, ur 20 (*)   ? Protein, ur 100 (*)   ? Leukocytes,Ua SMALL (*)   ? RBC / HPF >50 (*)   ? WBC, UA >50 (*)   ? Bacteria, UA MANY (*)   ? All other components within normal limits  ?URINE CULTURE  ?PREGNANCY, URINE  ? ? ? ?EKG ? ?None ? ? ?RADIOLOGY ?I independently interpreted and visualized the CXR. My interpretation: No pneumonia, no pneumothorax.  ?Radiology interpretation:  ?IMPRESSION:  ?No acute cardiopulmonary disease.  Mild hyperinflation.  ?   ? ? ? ?PROCEDURES: ? ?Critical Care performed: No ? ?Procedures ? ? ?MEDICATIONS ORDERED IN ED: ?Medications - No data to display ? ? ?IMPRESSION / MDM /  ASSESSMENT AND PLAN / ED COURSE  ?I reviewed the triage vital signs and the nursing notes. ?             ?               ? ?Differential diagnosis includes, but is not limited to, urinary tract infection, small bowel obstruction, colitis, prolapse.  Patient presented to the emergency department today because of concerns for bladder prolapse.  Says been accompanied by pain.  On exam she is tender in the suprapubic region.  States CT scan which did not show any concerning intra-abdominal infection or inflammation.  Urine is concerning for possible infection.  Blood work without concerning electrolyte abnormality.  Patient did feel better after medication.  Patient was noted to have oxygen levels to 90 which would drop below that while patient was sleeping.  Patient was ambulated and oxygen did not drop below 90.  Patient did not complain of any shortness of breath.  We will plan on discharging to have patient follow-up with OB/GYN.  Additionally discussed with patient relatively low  oxygen readings and importance of following up with primary care. ? ? ? ?FINAL CLINICAL IMPRESSION(S) / ED DIAGNOSES  ? ?Final diagnoses:  ?Pelvic floor dysfunction  ?Lower urinary tract infectious disease  ? ? ? ?Note:  This document was prepared using Dragon voice recognition software and may include unintentional dictation errors. ? ?  ?Phineas Semen, MD ?12/29/21 0016 ? ?

## 2021-12-28 NOTE — ED Triage Notes (Signed)
Pt also c/o nausea/vomiting since pain began ?

## 2021-12-29 MED ORDER — TRAMADOL HCL 50 MG PO TABS: 50.0000 mg | ORAL_TABLET | Freq: Four times a day (QID) | ORAL | 0 refills | Status: DC | PRN

## 2021-12-29 MED ORDER — CEPHALEXIN 500 MG PO CAPS: 500.0000 mg | ORAL_CAPSULE | Freq: Four times a day (QID) | ORAL | 0 refills | Status: AC

## 2021-12-29 NOTE — Discharge Instructions (Addendum)
Please seek medical attention for any high fevers, chest pain, shortness of breath, change in behavior, persistent vomiting, bloody stool or any other new or concerning symptoms.  

## 2021-12-29 NOTE — ED Notes (Signed)
PT ambulated on pulse ox at EDP request d/t patient sats of 88 while sleeping.  Pt O2 sat 90 at sitting and remains at 90 while ambulating. Both on RA. EDP Derrill Kay notified. ?

## 2021-12-31 LAB — URINE CULTURE

## 2022-04-06 ENCOUNTER — Emergency Department: Payer: Self-pay

## 2022-04-06 ENCOUNTER — Emergency Department
Admission: EM | Admit: 2022-04-06 | Discharge: 2022-04-07 | Disposition: A | Discharge status: Home / Self Care | Payer: Self-pay | Attending: Emergency Medicine | Admitting: Emergency Medicine

## 2022-04-06 ENCOUNTER — Other Ambulatory Visit: Payer: Self-pay

## 2022-04-06 DIAGNOSIS — R1031 Right lower quadrant pain: Secondary | ICD-10-CM | POA: Insufficient documentation

## 2022-04-06 LAB — URINALYSIS, ROUTINE W REFLEX MICROSCOPIC
Bilirubin Urine: NEGATIVE
Glucose, UA: NEGATIVE mg/dL
Ketones, ur: 20 mg/dL — AB
Leukocytes,Ua: NEGATIVE
Nitrite: NEGATIVE
Protein, ur: 30 mg/dL — AB
Specific Gravity, Urine: 1.016 (ref 1.005–1.030)
pH: 6 (ref 5.0–8.0)

## 2022-04-06 LAB — CBC WITH DIFFERENTIAL/PLATELET
Abs Immature Granulocytes: 0.02 10*3/uL (ref 0.00–0.07)
Basophils Absolute: 0 10*3/uL (ref 0.0–0.1)
Basophils Relative: 0 %
Eosinophils Absolute: 0 10*3/uL (ref 0.0–0.5)
Eosinophils Relative: 0 %
HCT: 43.9 % (ref 36.0–46.0)
Hemoglobin: 14 g/dL (ref 12.0–15.0)
Immature Granulocytes: 0 %
Lymphocytes Relative: 23 %
Lymphs Abs: 1.7 10*3/uL (ref 0.7–4.0)
MCH: 28.3 pg (ref 26.0–34.0)
MCHC: 31.9 g/dL (ref 30.0–36.0)
MCV: 88.7 fL (ref 80.0–100.0)
Monocytes Absolute: 0.6 10*3/uL (ref 0.1–1.0)
Monocytes Relative: 9 %
Neutro Abs: 4.9 10*3/uL (ref 1.7–7.7)
Neutrophils Relative %: 68 %
Platelets: 469 10*3/uL — ABNORMAL HIGH (ref 150–400)
RBC: 4.95 MIL/uL (ref 3.87–5.11)
RDW: 13.8 % (ref 11.5–15.5)
WBC: 7.3 10*3/uL (ref 4.0–10.5)
nRBC: 0 % (ref 0.0–0.2)

## 2022-04-06 LAB — COMPREHENSIVE METABOLIC PANEL
ALT: 8 U/L (ref 0–44)
AST: 15 U/L (ref 15–41)
Albumin: 3.9 g/dL (ref 3.5–5.0)
Alkaline Phosphatase: 88 U/L (ref 38–126)
Anion gap: 14 (ref 5–15)
BUN: 15 mg/dL (ref 6–20)
CO2: 24 mmol/L (ref 22–32)
Calcium: 9.6 mg/dL (ref 8.9–10.3)
Chloride: 101 mmol/L (ref 98–111)
Creatinine, Ser: 0.55 mg/dL (ref 0.44–1.00)
GFR, Estimated: >60 mL/min (ref >60)
Glucose, Bld: 97 mg/dL (ref 70–99)
Potassium: 3.4 mmol/L — ABNORMAL LOW (ref 3.5–5.1)
Sodium: 139 mmol/L (ref 135–145)
Total Bilirubin: 0.7 mg/dL (ref 0.3–1.2)
Total Protein: 8.2 g/dL — ABNORMAL HIGH (ref 6.5–8.1)

## 2022-04-06 LAB — LIPASE, BLOOD: Lipase: 29 U/L (ref 11–51)

## 2022-04-06 MED ORDER — SODIUM CHLORIDE 0.9 % IV BOLUS
1000.0000 mL | Freq: Once | INTRAVENOUS | Status: AC
  Administered 2022-04-06: 1000 mL via INTRAVENOUS

## 2022-04-06 MED ORDER — MORPHINE SULFATE (PF) 4 MG/ML IV SOLN
4.0000 mg | Freq: Once | INTRAVENOUS | Status: AC
  Administered 2022-04-06: 4 mg via INTRAVENOUS
  Filled 2022-04-06: qty 1

## 2022-04-06 MED ORDER — ONDANSETRON 4 MG PO TBDP: 4.0000 mg | ORAL_TABLET | Freq: Three times a day (TID) | ORAL | 0 refills | Status: DC | PRN

## 2022-04-06 MED ORDER — ONDANSETRON HCL 4 MG/2ML IJ SOLN
4.0000 mg | Freq: Once | INTRAMUSCULAR | Status: AC
  Administered 2022-04-06: 4 mg via INTRAVENOUS
  Filled 2022-04-06: qty 2

## 2022-04-06 MED ORDER — HYDROMORPHONE HCL 1 MG/ML IJ SOLN
1.0000 mg | Freq: Once | INTRAMUSCULAR | Status: AC
  Administered 2022-04-06: 1 mg via INTRAVENOUS
  Filled 2022-04-06: qty 1

## 2022-04-06 NOTE — ED Triage Notes (Signed)
Pt with a hx of prolapse bladder brought by EMS from home for evaluation of abd pain since Friday that radiates to the rt flank. Pt reports decreased urine output since Friday. IV established by EMS to the rt forearm, and received of fentanyl. Pt is alert and oriented, holding her stomach, and in discomfort. Rates pain a 10 out of 10.

## 2022-04-06 NOTE — ED Provider Notes (Signed)
[facility] Provider Note    Event Date/Time   First MD Initiated Contact with Patient 04/06/22 1914     (approximate)   History   Chief Complaint: Abdominal Pain (Lower abdominal pain that started on Friday and moves to the rt flank.)   HPI  Erika Downs is a 61 y.o. female with a past history of bladder suspension due to prolapse who is brought to the ED due to right flank pain and right lower quadrant pain for the past 3 days, gradual onset and worsening.  Now severe.  No aggravating or alleviating factors.  No dysuria, no vomiting or diarrhea or constipation.  No fever.     Physical Exam   Triage Vital Signs: ED Triage Vitals  Enc Vitals Group     BP 04/06/22 1923 (!) 148/124     Pulse Rate 04/06/22 1923 91     Resp 04/06/22 1923 20     Temp 04/06/22 1923 98.1 F (36.7 C)     Temp Source 04/06/22 1923 Oral     SpO2 04/06/22 1917 100 %     Weight 04/06/22 1926 109 lb 6.4 oz (49.6 kg)     Height 04/06/22 1926 5\' 2"  (1.575 m)     Head Circumference --      Peak Flow --      Pain Score 04/06/22 1924 10     Pain Loc --      Pain Edu? --      Excl. in GC? --     Most recent vital signs: Vitals:   04/06/22 2200 04/06/22 2300  BP: (!) 172/98 (!) 161/93  Pulse: 96 85  Resp: 16 15  Temp:    SpO2: (!) 87% (!) 87%    General: Awake, no distress.  CV:  Good peripheral perfusion.  Regular rate and rhythm Resp:  Normal effort.  Clear to auscultation bilaterally Abd:  No distention.  Soft with right lower quadrant tenderness. Other:  No lower extremity edema.  Dry mucous membranes.   ED Results / Procedures / Treatments   Labs (all labs ordered are listed, but only abnormal results are displayed) Labs Reviewed  COMPREHENSIVE METABOLIC PANEL - Abnormal; Notable for the following components:      Result Value   Potassium 3.4 (*)    Total Protein 8.2 (*)    All other components within normal limits  CBC WITH DIFFERENTIAL/PLATELET -  Abnormal; Notable for the following components:   Platelets 469 (*)    All other components within normal limits  URINALYSIS, ROUTINE W REFLEX MICROSCOPIC - Abnormal; Notable for the following components:   Color, Urine YELLOW (*)    APPearance CLEAR (*)    Hgb urine dipstick SMALL (*)    Ketones, ur 20 (*)    Protein, ur 30 (*)    Bacteria, UA RARE (*)    All other components within normal limits  LIPASE, BLOOD     EKG Interpreted by me Sinus rhythm rate of 84.  Normal axis and intervals.  Normal QRS ST segments and T waves.   RADIOLOGY CT abdomen pelvis interpreted by me, negative for free air, intra-abdominal abscess, bowel obstruction, hernia.  Radiology report reviewed   PROCEDURES:  Procedures   MEDICATIONS ORDERED IN ED: Medications  sodium chloride 0.9 % bolus 1,000 mL (0 mLs Intravenous Stopped 04/06/22 2130)  ondansetron (ZOFRAN) injection 4 mg (4 mg Intravenous Given 04/06/22 1942)  morphine (PF) 4 MG/ML injection 4 mg (4  mg Intravenous Given 04/06/22 1943)  HYDROmorphone (DILAUDID) injection 1 mg (1 mg Intravenous Given 04/06/22 2142)     IMPRESSION / MDM / ASSESSMENT AND PLAN / ED COURSE  I reviewed the triage vital signs and the nursing notes.                              Differential diagnosis includes, but is not limited to, appendicitis, kidney stone, pelvic mass, diverticulitis, bowel obstruction, hernia  Patient's presentation is most consistent with acute presentation with potential threat to life or bodily function.  Patient presents with severe right lower quadrant abdominal pain.  Has tenderness on exam, but abdomen is nonsurgical.  Vital signs are essentially normal.  Labs and CT obtained which are all reassuring.  Pain is much improved after IV fluids and a dose of morphine.  She denies any chest pain or shortness of breath.  Vital signs have remained stable throughout work-up.  Oxygenation is low normal at about 93% on room air which I suspect is  her chronic baseline.  With reassuring work-up and improvement in symptoms she does not require admission.  We will provide a Zofran prescription and recommend close follow-up with PCP.  Return precautions discussed.  Clinical Course as of 04/06/22 2352  Mon Apr 06, 2022  2331 [facility]: 28.3 [PS]    Clinical Course User Index [PS] Sharman Cheek, MD     FINAL CLINICAL IMPRESSION(S) / ED DIAGNOSES   Final diagnoses:  Right lower quadrant abdominal pain     Rx / DC Orders   ED Discharge Orders          Ordered    ondansetron (ZOFRAN-ODT) 4 MG disintegrating tablet  Every 8 hours PRN        04/06/22 2352             Note:  This document was prepared using Dragon voice recognition software and may include unintentional dictation errors.   Sharman Cheek, MD 04/07/22 0002

## 2022-05-03 IMAGING — CR DG CHEST 2V
1 series · 2 of 2 positions shown · non-contrast
Comparison: 04/01/2018

CLINICAL DATA: Chest pain

EXAM:
CHEST - 2 VIEW

[Series 1: dg chest 2 view · 0.14mm/px · 2 of 2 slices shown]
[im 1/2]
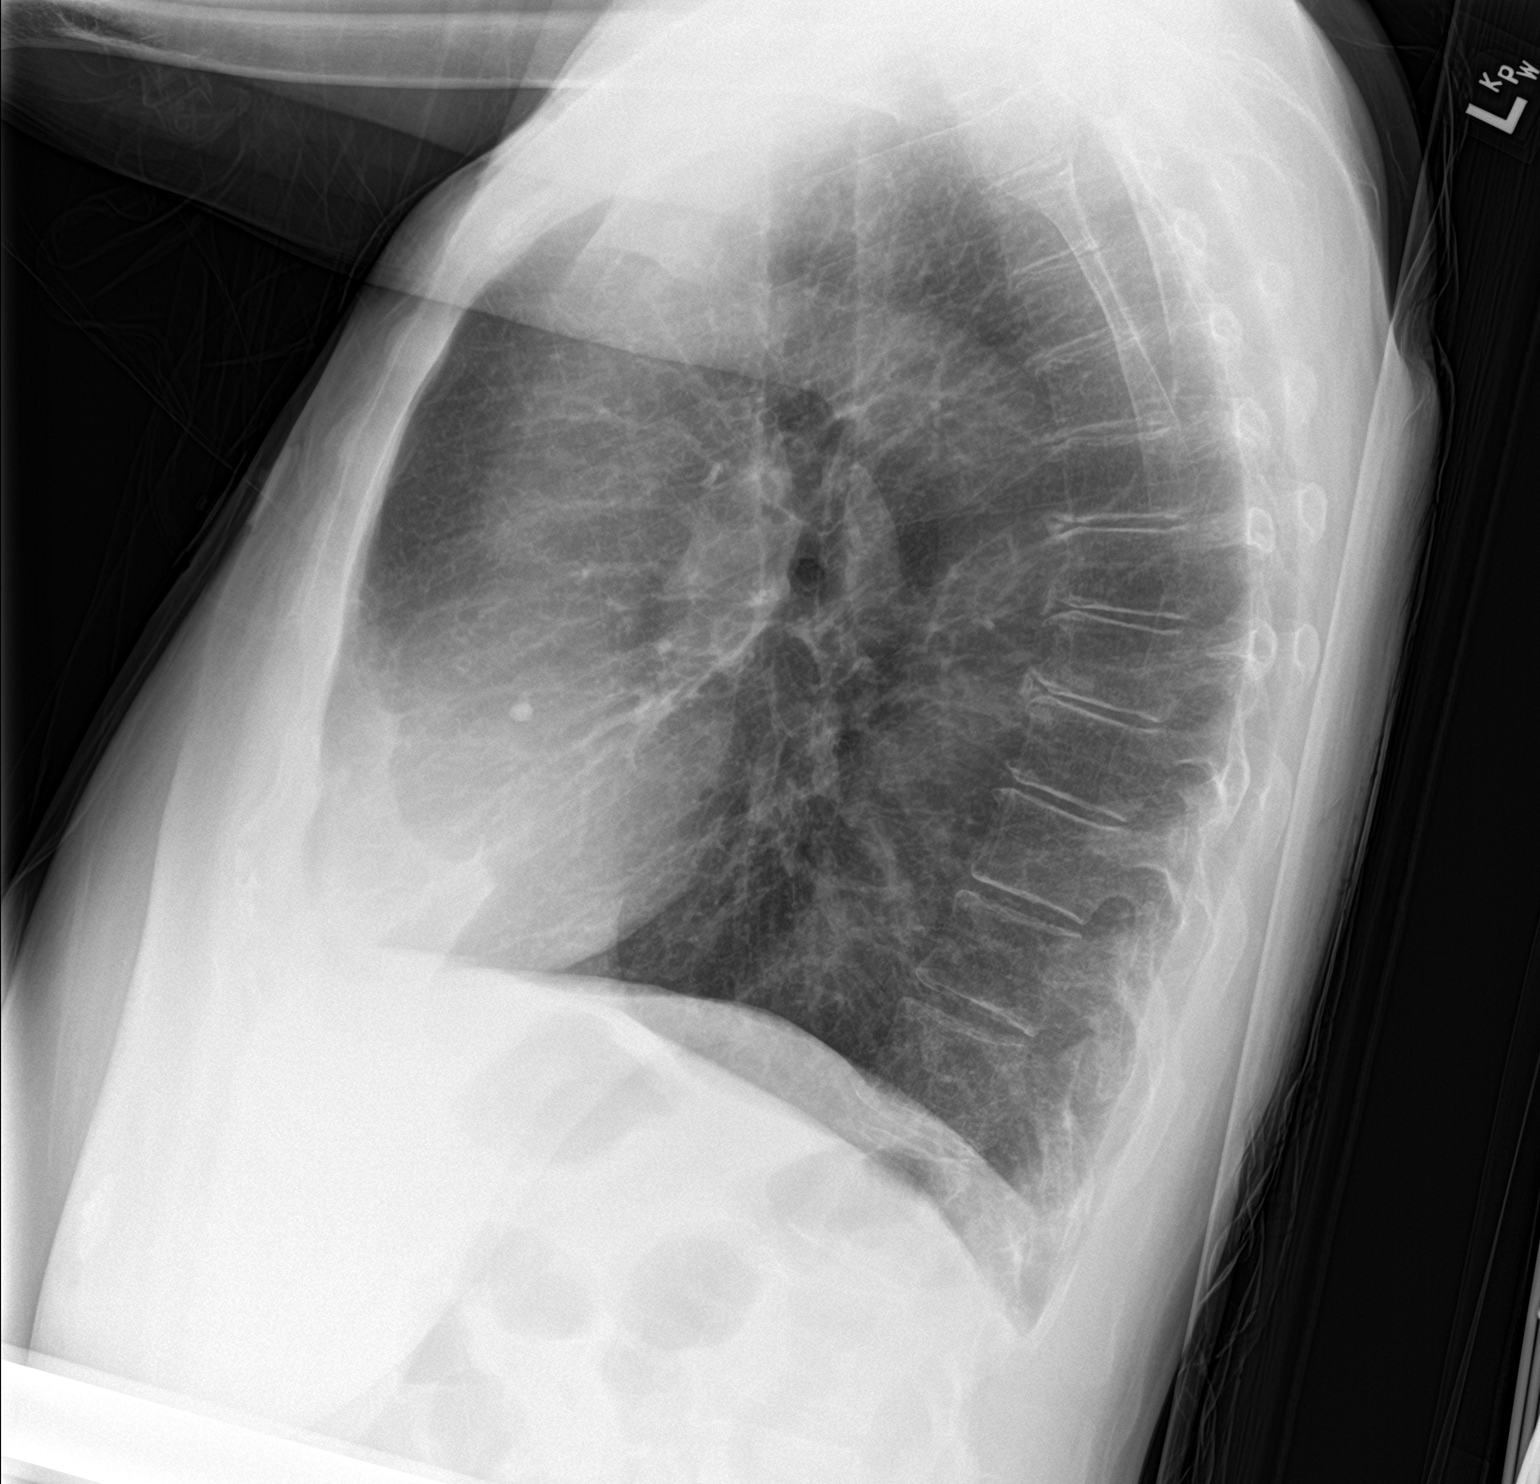
[im 2/2]
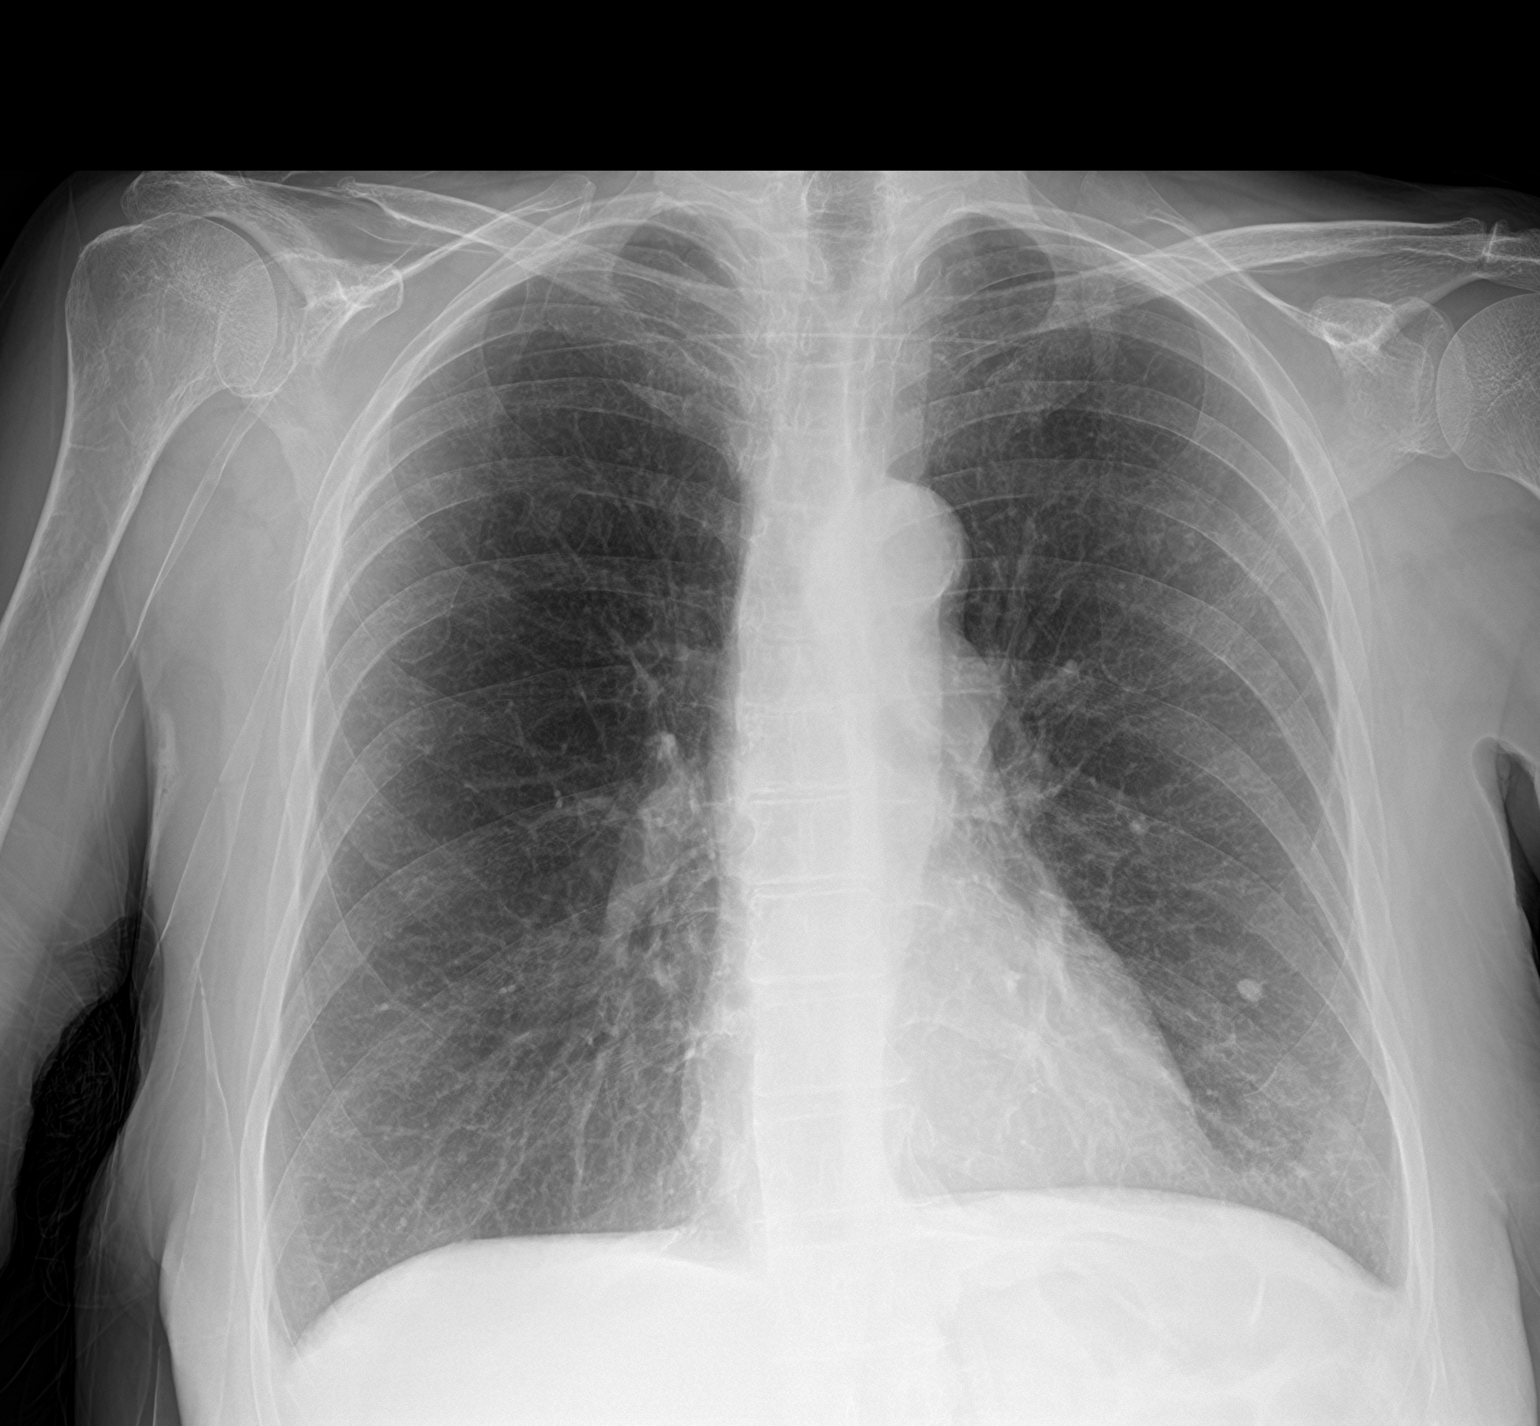

[2 of 2 positions shown; findings below may reference images not displayed]

FINDINGS: Calcified granuloma in the left lower lung. Heart and mediastinal
contours are within normal limits. No focal opacities or effusions.
No acute bony abnormality.
IMPRESSION: No active cardiopulmonary disease.

## 2022-06-02 ENCOUNTER — Emergency Department: Payer: Self-pay

## 2022-06-02 ENCOUNTER — Other Ambulatory Visit: Payer: Self-pay

## 2022-06-02 DIAGNOSIS — X501XXA Overexertion from prolonged static or awkward postures, initial encounter: Secondary | ICD-10-CM | POA: Insufficient documentation

## 2022-06-02 DIAGNOSIS — Z5321 Procedure and treatment not carried out due to patient leaving prior to being seen by health care provider: Secondary | ICD-10-CM | POA: Insufficient documentation

## 2022-06-02 DIAGNOSIS — M25562 Pain in left knee: Secondary | ICD-10-CM | POA: Insufficient documentation

## 2022-06-02 NOTE — ED Triage Notes (Signed)
Pt presents via POV c/o left knee pain since yesterday. Reports twisted knee moving furniture.

## 2022-06-03 ENCOUNTER — Emergency Department
Admission: EM | Admit: 2022-06-03 | Discharge: 2022-06-03 | Discharge status: Against Medical Advice | Payer: Self-pay | Attending: Emergency Medicine | Admitting: Emergency Medicine

## 2022-08-08 IMAGING — CT CT ABD-PELV W/O CM
2 of 4 series · 16 of 46 positions shown, 18 images · non-contrast
Comparison: 02/16/2020

CLINICAL DATA: Lower abdominal pain. History of bladder prolapse.
Bladder sling placed 10 years ago.



[Series 2: ap without · axial · non-contrast · 0.71mm/px · z∈[-970,-615]mm · 13 of 79 slices shown, 15 images]
[im 4/79  soft-tissue]
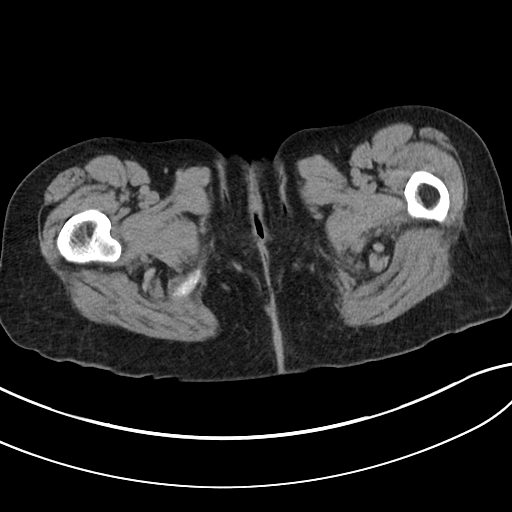
[im 4/79  bone]
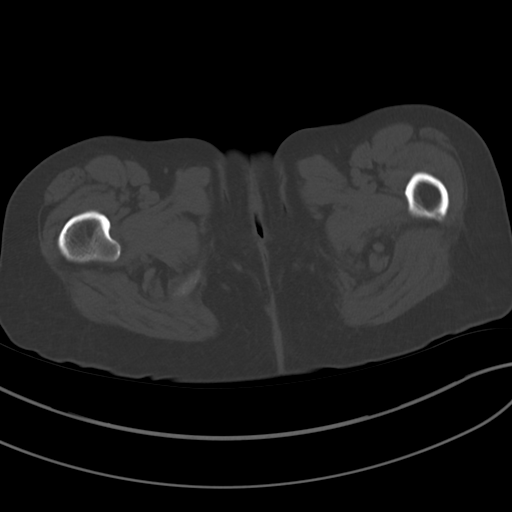
[im 12/79  soft-tissue]
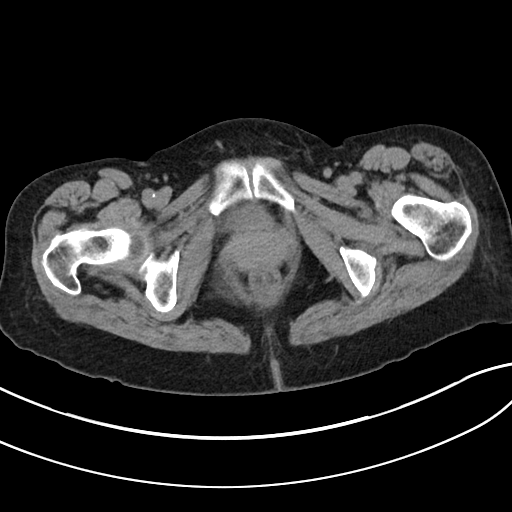
[im 15/79  soft-tissue]
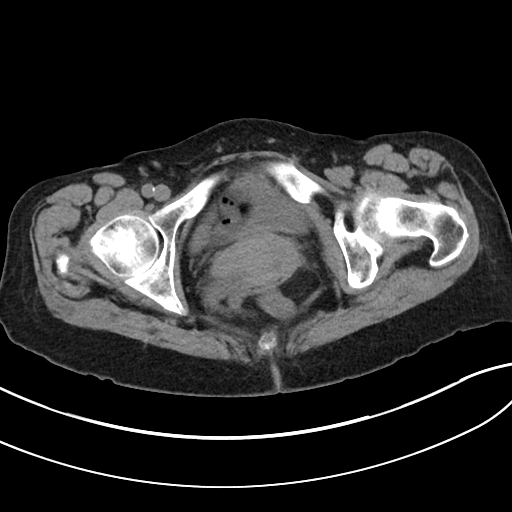
[im 23/79  soft-tissue]
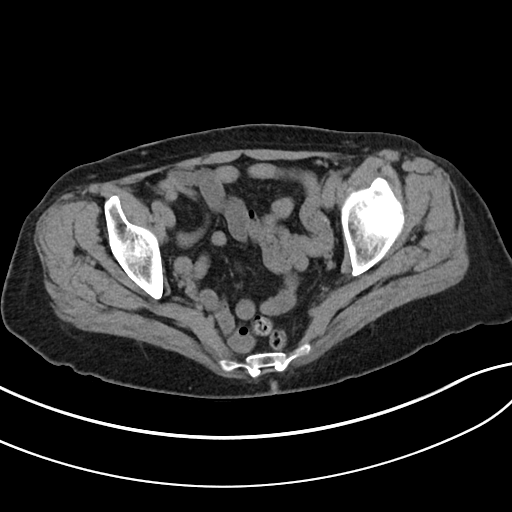
[im 27/79  soft-tissue]
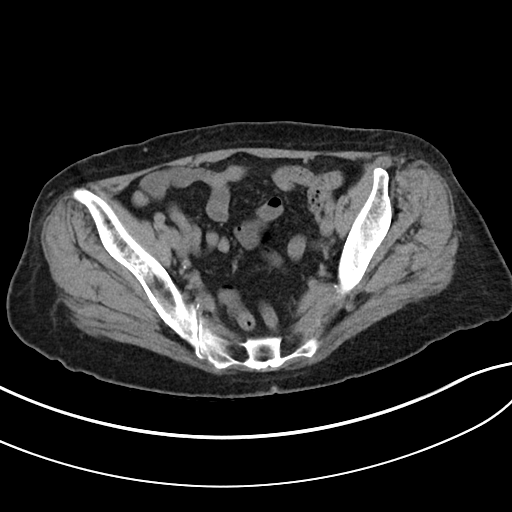
[im 34/79  soft-tissue]
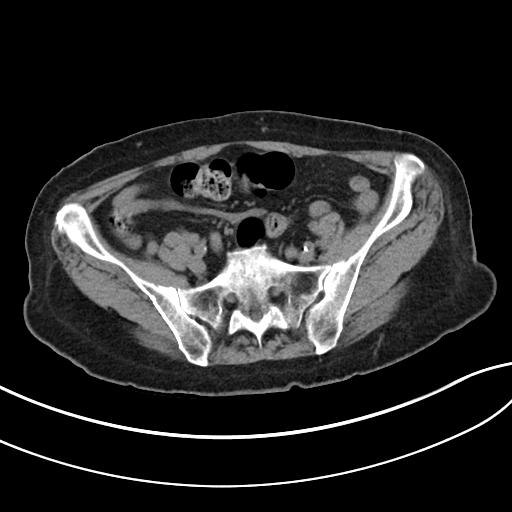
[im 41/79  soft-tissue]
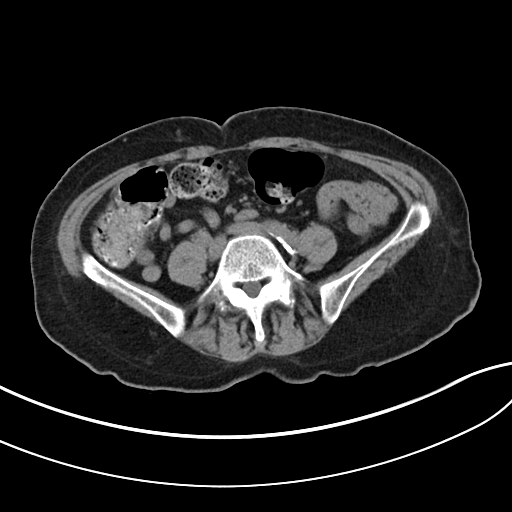
[im 45/79  soft-tissue]
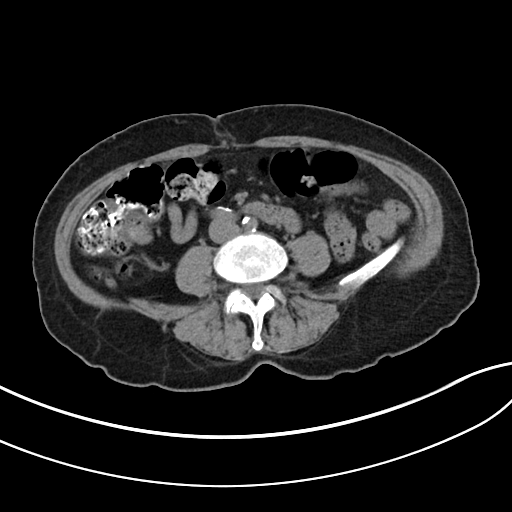
[im 53/79  soft-tissue]
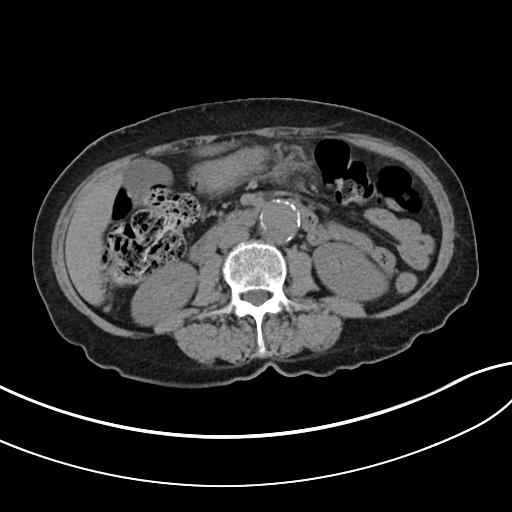
[im 53/79  bone]
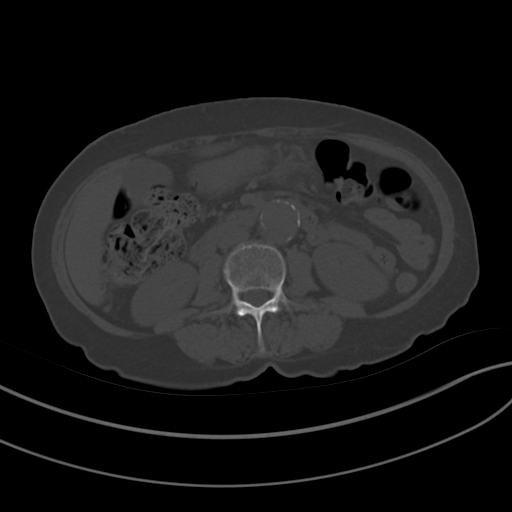
[im 56/79  soft-tissue]
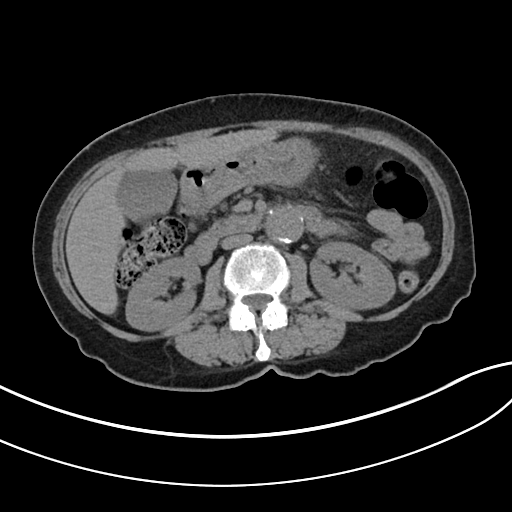
[im 64/79  soft-tissue]
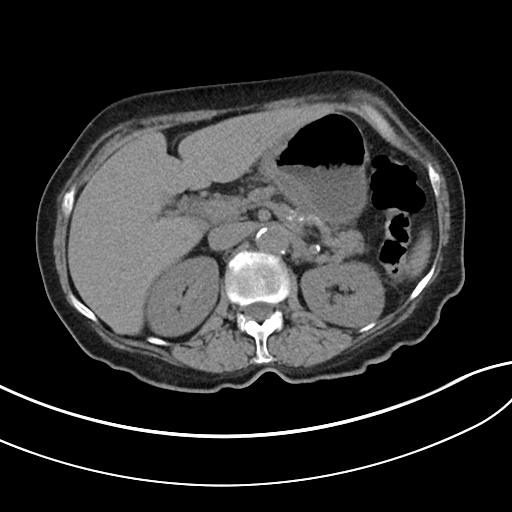
[im 67/79  soft-tissue]
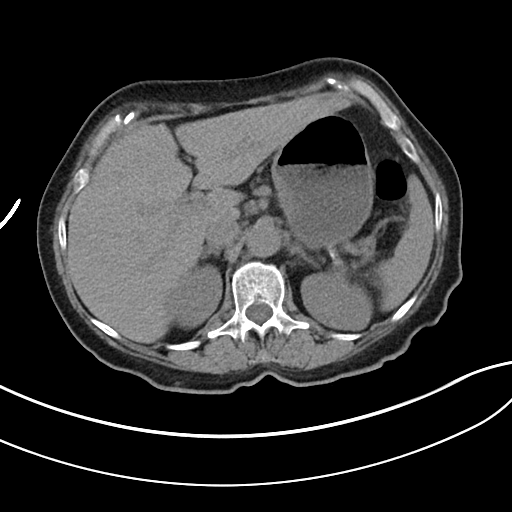
[im 75/79  soft-tissue]
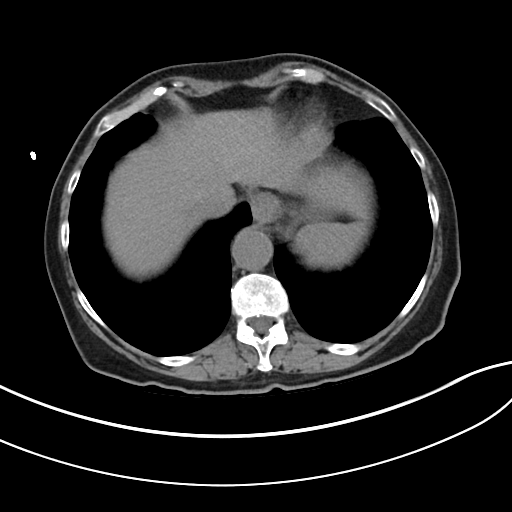

[Series 5: cor · coronal · 0.74mm/px · 3 of 85 slices shown]
[im 29/85  soft-tissue]
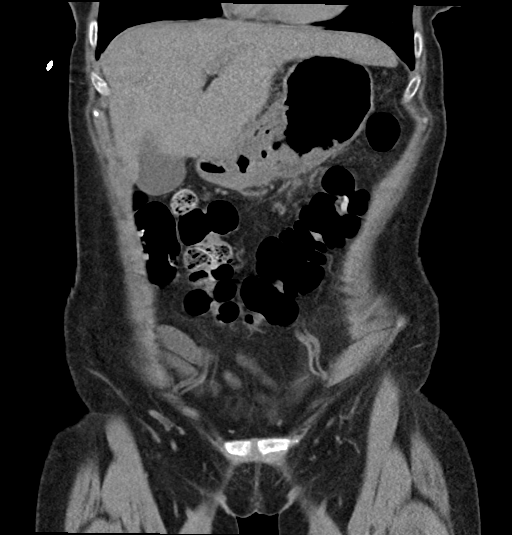
[im 38/85  soft-tissue]
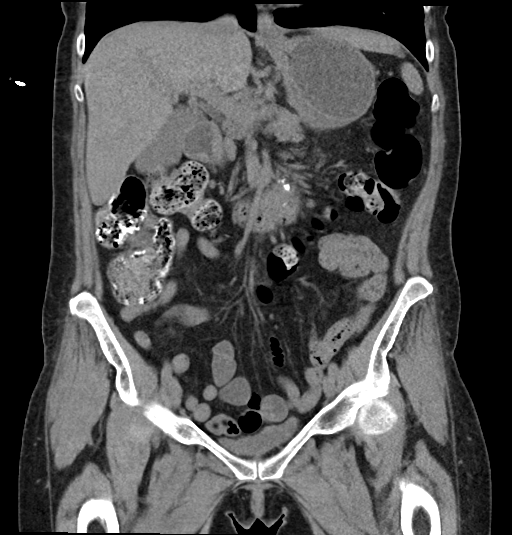
[im 47/85  soft-tissue]
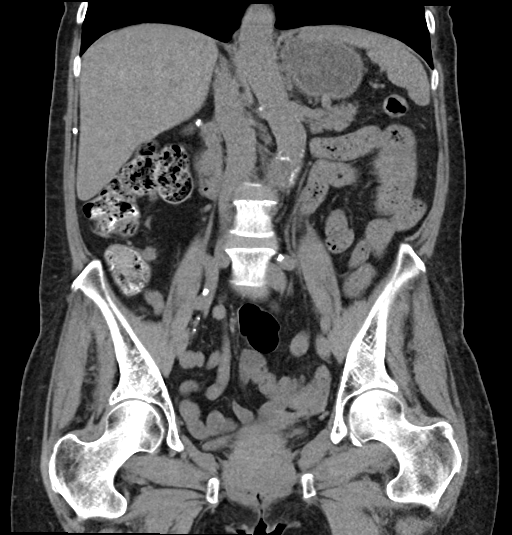

[16 of 46 positions shown; findings below may reference images not displayed]

FINDINGS: Lower chest: Emphysematous changes in the lung bases.

Hepatobiliary: No focal liver abnormality is seen. No gallstones,
gallbladder wall thickening, or biliary dilatation.

Pancreas: Unremarkable. No pancreatic ductal dilatation or
surrounding inflammatory changes.

Spleen: Normal in size without focal abnormality.

Adrenals/Urinary Tract: Adrenal glands are unremarkable. Kidneys are
normal, without renal calculi, focal lesion, or hydronephrosis.
Bladder is unremarkable.

Stomach/Bowel: There is a focal area of infiltration in the fat
along the inferior greater curvature of the stomach associated with
some mildly enlarged lymph nodes. The stomach is decompressed,
limiting evaluation of the wall, but this suggests possibility of
inflammatory process resulting from a gastric ulcer or less likely
from a focal gastric lesion. Consider endoscopic evaluation. No
discrete abscess is identified. Appendix appears normal. No evidence
of bowel wall thickening, distention, or inflammatory changes.

Vascular/Lymphatic: Infrarenal abdominal aortic aneurysm measuring
3.1 cm AP diameter. Scattered aortic calcification. No significant
lymphadenopathy.

Reproductive: Uterus and bilateral adnexa are unremarkable.

Other: No abdominal wall hernia or abnormality. No abdominopelvic
ascites.

Musculoskeletal: No acute or significant osseous findings.
IMPRESSION: 1. Focal area of infiltration in the fat along the greater curvature
of the stomach with mildly enlarged adjacent lymph nodes. Suggest
inflammatory process such as gastric ulcer or less likely focal
gastric lesion. Consider endoscopic evaluation.
2. 3.1 cm infrarenal abdominal aortic aneurysm. Recommend follow-up
every 3 years.
Reference: [HOSPITAL] 2348;[DATE].
3. Probable emphysema in the lung bases.

## 2022-09-14 ENCOUNTER — Ambulatory Visit
Admission: RE | Admit: 2022-09-14 | Discharge: 2022-09-14 | Disposition: A | Discharge status: Home / Self Care | Payer: Medicaid Other | Source: Ambulatory Visit | Attending: Family Medicine | Admitting: Family Medicine

## 2022-09-14 VITALS — BP 173/98 | HR 83 | Temp 98.3°F | Resp 16

## 2022-09-14 DIAGNOSIS — J101 Influenza due to other identified influenza virus with other respiratory manifestations: Secondary | ICD-10-CM | POA: Insufficient documentation

## 2022-09-14 DIAGNOSIS — R030 Elevated blood-pressure reading, without diagnosis of hypertension: Secondary | ICD-10-CM | POA: Insufficient documentation

## 2022-09-14 DIAGNOSIS — Z1152 Encounter for screening for COVID-19: Secondary | ICD-10-CM | POA: Diagnosis not present

## 2022-09-14 LAB — RESP PANEL BY RT-PCR (RSV, FLU A&B, COVID)  RVPGX2
Influenza A by PCR: POSITIVE — AB
Influenza B by PCR: NEGATIVE
Resp Syncytial Virus by PCR: NEGATIVE
SARS Coronavirus 2 by RT PCR: NEGATIVE

## 2022-09-14 MED ORDER — OSELTAMIVIR PHOSPHATE 75 MG PO CAPS: 75.0000 mg | ORAL_CAPSULE | Freq: Two times a day (BID) | ORAL | 0 refills | Status: DC

## 2022-09-14 NOTE — ED Provider Notes (Signed)
[facility]    CSN: [id number] Arrival date & time: 09/14/22  1732      History   Chief Complaint Chief Complaint  Patient presents with   Fever   Headache   Generalized Body Aches   Cough    HPI Erika Downs is a 61 y.o. female.   HPI   Erika Downs presents for flulike symptoms for the past day.  Denies nausea, vomiting, diarrhea, shortness of breath, chest discomfort and new rash. Reports having a fever at home.  She has been taking over-the-counter medications with some relief of her symptoms.     Past Medical History:  Diagnosis Date   Chest pain    Hyperlipidemia     Patient Active Problem List   Diagnosis Date Noted   Chest pain 09/25/2011    Past Surgical History:  Procedure Laterality Date   BLADDER SUSPENSION     NOVASURE ABLATION      OB History   No obstetric history on file.      Home Medications    Prior to Admission medications   Medication Sig Start Date End Date Taking? Authorizing Provider  diazepam (VALIUM) 5 MG tablet Take 1 tablet (5 mg total) by mouth every 8 (eight) hours as needed for muscle spasms. Patient not taking: Reported on 04/01/2018 12/10/16   Emily Filbert, MD  famotidine (PEPCID) 20 MG tablet Take 1 tablet (20 mg total) by mouth 2 (two) times daily. 09/22/21   Sharman Cheek, MD  ibuprofen (ADVIL,MOTRIN) 600 MG tablet Take 1 tablet (600 mg total) by mouth every 6 (six) hours as needed. Patient not taking: Reported on 04/01/2018 05/20/17   Enid Derry, PA-C  metoCLOPramide (REGLAN) 10 MG tablet Take 1 tablet (10 mg total) by mouth every 6 (six) hours as needed. 09/22/21   Sharman Cheek, MD  ondansetron (ZOFRAN-ODT) 4 MG disintegrating tablet Take 1 tablet (4 mg total) by mouth every 8 (eight) hours as needed for nausea or vomiting. 04/06/22   Sharman Cheek, MD  oseltamivir (TAMIFLU) 75 MG capsule Take 1 capsule (75 mg total) by mouth every 12 (twelve) hours. 09/14/22   Iriel Nason, Seward Meth, DO   sucralfate (CARAFATE) 1 g tablet Take 1 tablet (1 g total) by mouth 4 (four) times daily. 09/22/21   Sharman Cheek, MD  traMADol (ULTRAM) 50 MG tablet Take 1 tablet (50 mg total) by mouth every 6 (six) hours as needed. 12/29/21 12/29/22  Phineas Semen, MD    Family History No family history on file.  Social History Social History   Tobacco Use   Smoking status: Every Day    Packs/day: 0.50    Types: Cigarettes   Smokeless tobacco: Never  Vaping Use   Vaping Use: Never used  Substance Use Topics   Alcohol use: Yes   Drug use: No     Allergies   Ibuprofen, Ivp dye [iodinated contrast media], and Penicillins   Review of Systems Review of Systems: negative unless otherwise stated in HPI.      Physical Exam Triage Vital Signs ED Triage Vitals  Enc Vitals Group     BP 09/14/22 1755 (!) 173/98     Pulse Rate 09/14/22 1755 83     Resp 09/14/22 1755 16     Temp 09/14/22 1755 98.3 F (36.8 C)     Temp Source 09/14/22 1755 Oral     SpO2 09/14/22 1755 98 %     Weight --      Height --  Head Circumference --      Peak Flow --      Pain Score 09/14/22 1752 10     Pain Loc --      Pain Edu? --      Excl. in GC? --    No data found.  Updated Vital Signs BP (!) 173/98 (BP Location: Right Arm)   Pulse 83   Temp 98.3 F (36.8 C) (Oral)   Resp 16   SpO2 98%   Visual Acuity Right Eye Distance:   Left Eye Distance:   Bilateral Distance:    Right Eye Near:   Left Eye Near:    Bilateral Near:     Physical Exam GEN:     alert, non-toxic appearing female in no distress    HENT:  mucus membranes moist, oropharyngeal without lesions or exudate, no tonsillar hypertrophy, mild oropharyngeal erythema, clear nasal discharge EYES:   pupils equal and reactive, no scleral injection or discharge NECK:  normal ROM, no lymphadenopathy, no meningismus   RESP:  no increased work of breathing, clear to auscultation bilaterally CVS:   regular rate and rhythm Skin:   warm  and dry    UC Treatments / Results  Labs (all labs ordered are listed, but only abnormal results are displayed) Labs Reviewed  RESP PANEL BY RT-PCR (RSV, FLU A&B, COVID)  RVPGX2 - Abnormal; Notable for the following components:      Result Value   Influenza A by PCR POSITIVE (*)    All other components within normal limits    EKG   Radiology No results found.  Procedures Procedures (including critical care time)  Medications Ordered in UC Medications - No data to display  Initial Impression / Assessment and Plan / UC Course  I have reviewed the triage vital signs and the nursing notes.  Pertinent labs & imaging results that were available during my care of the patient were reviewed by me and considered in my medical decision making (see chart for details).       Pt is a 61 y.o. female who presents for 1 day of respiratory symptoms. Erika Downs is afebrile here. She is hypertensive. Satting well on room air. Overall pt is ill but non-toxic appearing, well hydrated, without respiratory distress. Pulmonary exam  is unremarkable.  COVID and influenza testing obtained  and was positive for influenza A. History consistent with  viral respiratory illness. Discussed symptomatic treatment.  She is interested in Tamiflu and is within the treatment window.  Tamiflu sent to pharmacy. Typical duration of symptoms discussed.   Return and ED precautions given and voiced understanding. Discussed MDM, treatment plan and plan for follow-up with patient who agrees with plan.     Final Clinical Impressions(s) / UC Diagnoses   Final diagnoses:  Influenza A  Elevated blood-pressure reading, without diagnosis of hypertension     Discharge Instructions      Your test for the flu was positive (influenza A).  If your were prescribed medication, stop by the pharmacy to pick them up.   You can take Tylenol (1000 mg) and/or Ibuprofen (600-800 mg) as needed for fever reduction and pain relief.     For cough: honey 1/2 to 1 teaspoon (you can dilute the honey in water or another fluid).  You can also use guaifenesin and dextromethorphan for cough. You can use a humidifier for chest congestion and cough.  If you don't have a humidifier, you can sit in the bathroom with the hot shower  running.      For sore throat: try warm salt water gargles, Mucinex sore throat cough drops or cepacol lozenges, throat spray, warm tea or water with lemon/honey, popsicles or ice, or OTC cold relief medicine for throat discomfort. You can also purchase chloraseptic spray at the pharmacy or dollar store.   For congestion: take a daily anti-histamine like Zyrtec, Claritin, and a oral decongestant, such as pseudoephedrine.  You can also use Flonase 1-2 sprays in each nostril daily. Afrin is also a good option, if you do not have high blood pressure.    It is important to stay hydrated: drink plenty of fluids (water, gatorade/powerade/pedialyte, juices, or teas) to keep your throat moisturized and help further relieve irritation/discomfort.    Return or go to the Emergency Department if symptoms worsen or do not improve in the next few days      ED Prescriptions     Medication Sig Dispense Auth. Provider   oseltamivir (TAMIFLU) 75 MG capsule Take 1 capsule (75 mg total) by mouth every 12 (twelve) hours. 10 capsule Katha Cabal, DO      PDMP not reviewed this encounter.   Katha Cabal, DO 09/16/22 2110

## 2022-09-14 NOTE — ED Triage Notes (Signed)
Pt presents with a cough, bodyaches, fever, and headache started this morning.

## 2022-09-14 NOTE — Discharge Instructions (Addendum)
Your test for the flu was positive (influenza A).  If your were prescribed medication, stop by the pharmacy to pick them up.   You can take Tylenol (1000 mg) and/or Ibuprofen (600-800 mg) as needed for fever reduction and pain relief.    For cough: honey 1/2 to 1 teaspoon (you can dilute the honey in water or another fluid).  You can also use guaifenesin and dextromethorphan for cough. You can use a humidifier for chest congestion and cough.  If you don't have a humidifier, you can sit in the bathroom with the hot shower running.      For sore throat: try warm salt water gargles, Mucinex sore throat cough drops or cepacol lozenges, throat spray, warm tea or water with lemon/honey, popsicles or ice, or OTC cold relief medicine for throat discomfort. You can also purchase chloraseptic spray at the pharmacy or dollar store.   For congestion: take a daily anti-histamine like Zyrtec, Claritin, and a oral decongestant, such as pseudoephedrine.  You can also use Flonase 1-2 sprays in each nostril daily. Afrin is also a good option, if you do not have high blood pressure.    It is important to stay hydrated: drink plenty of fluids (water, gatorade/powerade/pedialyte, juices, or teas) to keep your throat moisturized and help further relieve irritation/discomfort.    Return or go to the Emergency Department if symptoms worsen or do not improve in the next few days

## 2022-09-17 ENCOUNTER — Encounter: Payer: Self-pay | Admitting: *Deleted

## 2022-09-17 ENCOUNTER — Emergency Department
Admission: EM | Admit: 2022-09-17 | Discharge: 2022-09-17 | Discharge status: Against Medical Advice | Payer: Medicaid Other | Attending: Emergency Medicine | Admitting: Emergency Medicine

## 2022-09-17 ENCOUNTER — Other Ambulatory Visit: Payer: Self-pay

## 2022-09-17 DIAGNOSIS — Z5321 Procedure and treatment not carried out due to patient leaving prior to being seen by health care provider: Secondary | ICD-10-CM | POA: Diagnosis not present

## 2022-09-17 DIAGNOSIS — R112 Nausea with vomiting, unspecified: Secondary | ICD-10-CM | POA: Diagnosis present

## 2022-09-17 LAB — BASIC METABOLIC PANEL
Anion gap: 17 — ABNORMAL HIGH (ref 5–15)
BUN: 50 mg/dL — ABNORMAL HIGH (ref 6–20)
CO2: 21 mmol/L — ABNORMAL LOW (ref 22–32)
Calcium: 8.8 mg/dL — ABNORMAL LOW (ref 8.9–10.3)
Chloride: 94 mmol/L — ABNORMAL LOW (ref 98–111)
Creatinine, Ser: 1.23 mg/dL — ABNORMAL HIGH (ref 0.44–1.00)
GFR, Estimated: 51 mL/min — ABNORMAL LOW (ref >60)
Glucose, Bld: 158 mg/dL — ABNORMAL HIGH (ref 70–99)
Potassium: 3.5 mmol/L (ref 3.5–5.1)
Sodium: 132 mmol/L — ABNORMAL LOW (ref 135–145)

## 2022-09-17 LAB — CBC
HCT: 49.8 % — ABNORMAL HIGH (ref 36.0–46.0)
Hemoglobin: 15.6 g/dL — ABNORMAL HIGH (ref 12.0–15.0)
MCH: 27.8 pg (ref 26.0–34.0)
MCHC: 31.3 g/dL (ref 30.0–36.0)
MCV: 88.6 fL (ref 80.0–100.0)
Platelets: 296 10*3/uL (ref 150–400)
RBC: 5.62 MIL/uL — ABNORMAL HIGH (ref 3.87–5.11)
RDW: 15.1 % (ref 11.5–15.5)
WBC: 5.3 10*3/uL (ref 4.0–10.5)
nRBC: 0 % (ref 0.0–0.2)

## 2022-09-17 NOTE — ED Triage Notes (Signed)
Pt was dx this week with flu at [facility].  Pt states she has no appetite.  Pt reports has n/v.  Pt alert  speech clear.

## 2022-09-18 ENCOUNTER — Telehealth: Payer: Self-pay | Admitting: Emergency Medicine

## 2022-09-18 NOTE — Telephone Encounter (Signed)
Called patient due to left emergency department before provider exam to inquire about condition and follow up plans. Cell number does not work.  Called home number and left message.

## 2022-09-19 ENCOUNTER — Ambulatory Visit: Payer: Self-pay

## 2022-09-20 ENCOUNTER — Emergency Department
Admission: EM | Admit: 2022-09-20 | Discharge: 2022-09-20 | Discharge status: Against Medical Advice | Payer: Medicaid Other | Attending: Physician Assistant | Admitting: Physician Assistant

## 2022-09-20 ENCOUNTER — Other Ambulatory Visit: Payer: Self-pay

## 2022-09-20 DIAGNOSIS — R109 Unspecified abdominal pain: Secondary | ICD-10-CM | POA: Diagnosis present

## 2022-09-20 DIAGNOSIS — Z5321 Procedure and treatment not carried out due to patient leaving prior to being seen by health care provider: Secondary | ICD-10-CM | POA: Diagnosis not present

## 2022-09-20 DIAGNOSIS — Z1152 Encounter for screening for COVID-19: Secondary | ICD-10-CM | POA: Insufficient documentation

## 2022-09-20 LAB — CBC WITH DIFFERENTIAL/PLATELET
Abs Immature Granulocytes: 0.01 10*3/uL (ref 0.00–0.07)
Basophils Absolute: 0 10*3/uL (ref 0.0–0.1)
Basophils Relative: 0 %
Eosinophils Absolute: 0 10*3/uL (ref 0.0–0.5)
Eosinophils Relative: 0 %
HCT: 43.6 % (ref 36.0–46.0)
Hemoglobin: 14.3 g/dL (ref 12.0–15.0)
Immature Granulocytes: 0 %
Lymphocytes Relative: 24 %
Lymphs Abs: 1.1 10*3/uL (ref 0.7–4.0)
MCH: 28.3 pg (ref 26.0–34.0)
MCHC: 32.8 g/dL (ref 30.0–36.0)
MCV: 86.3 fL (ref 80.0–100.0)
Monocytes Absolute: 0.5 10*3/uL (ref 0.1–1.0)
Monocytes Relative: 11 %
Neutro Abs: 2.9 10*3/uL (ref 1.7–7.7)
Neutrophils Relative %: 65 %
Platelets: 219 10*3/uL (ref 150–400)
RBC: 5.05 MIL/uL (ref 3.87–5.11)
RDW: 15.1 % (ref 11.5–15.5)
WBC: 4.5 10*3/uL (ref 4.0–10.5)
nRBC: 0 % (ref 0.0–0.2)

## 2022-09-20 LAB — COMPREHENSIVE METABOLIC PANEL
ALT: 9 U/L (ref 0–44)
AST: 18 U/L (ref 15–41)
Albumin: 3.2 g/dL — ABNORMAL LOW (ref 3.5–5.0)
Alkaline Phosphatase: 72 U/L (ref 38–126)
Anion gap: 13 (ref 5–15)
BUN: 56 mg/dL — ABNORMAL HIGH (ref 6–20)
CO2: 24 mmol/L (ref 22–32)
Calcium: 8.5 mg/dL — ABNORMAL LOW (ref 8.9–10.3)
Chloride: 99 mmol/L (ref 98–111)
Creatinine, Ser: 0.72 mg/dL (ref 0.44–1.00)
GFR, Estimated: >60 mL/min (ref >60)
Glucose, Bld: 122 mg/dL — ABNORMAL HIGH (ref 70–99)
Potassium: 3.3 mmol/L — ABNORMAL LOW (ref 3.5–5.1)
Sodium: 136 mmol/L (ref 135–145)
Total Bilirubin: 0.7 mg/dL (ref 0.3–1.2)
Total Protein: 7.8 g/dL (ref 6.5–8.1)

## 2022-09-20 LAB — LIPASE, BLOOD: Lipase: 46 U/L (ref 11–51)

## 2022-09-20 LAB — RESP PANEL BY RT-PCR (RSV, FLU A&B, COVID)  RVPGX2
Influenza A by PCR: POSITIVE — AB
Influenza B by PCR: NEGATIVE
Resp Syncytial Virus by PCR: NEGATIVE
SARS Coronavirus 2 by RT PCR: NEGATIVE

## 2022-09-20 NOTE — ED Triage Notes (Signed)
Pt BIB EMS c/o generalized abdominal pain that has been ongoing for a "couple days." Pain associated with vomiting. Denies urinary problems.

## 2022-09-20 NOTE — ED Provider Triage Note (Signed)
Emergency Medicine Provider Triage Evaluation Note  Erika Downs , a 61 y.o. female  was evaluated in triage.  Pt complains of abdominal pain.  Review of Systems  Positive:  Negative:   Physical Exam  BP 123/78 (BP Location: Left Arm)   Pulse 85   Temp 98.5 F (36.9 C) (Oral)   Resp 20   Ht 5\' 2"  (1.575 m)   Wt 44 kg   SpO2 96%   BMI 17.74 kg/m  Gen:   Awake, no distress   Resp:  Normal effort  MSK:   Moves extremities without difficulty  Other:    Medical Decision Making  Medically screening exam initiated at 9:10 PM.  Appropriate orders placed.  KAYDA ALLERS was informed that the remainder of the evaluation will be completed by another provider, this initial triage assessment does not replace that evaluation, and the importance of remaining in the ED until their evaluation is complete.     Charlestine Massed, PA-C 09/20/22 2146

## 2022-09-20 NOTE — ED Triage Notes (Signed)
First nurse note- Pt to triage via [facility] from home. DX with flu x 3 days ago. Reports N/V since dx, abd pain. Stopped taking Tamiflu because " it made her feel sicker."

## 2022-09-20 NOTE — ED Notes (Signed)
Pt husband to desk stating pt is leaving

## 2022-11-14 ENCOUNTER — Emergency Department: Payer: Medicaid Other

## 2022-11-14 ENCOUNTER — Inpatient Hospital Stay
Admission: EM | Admit: 2022-11-14 | Discharge: 2022-11-24 | DRG: 377 | Disposition: A | Discharge status: Home / Self Care | Payer: Medicaid Other | Attending: Student | Admitting: Student

## 2022-11-14 DIAGNOSIS — E785 Hyperlipidemia, unspecified: Secondary | ICD-10-CM | POA: Diagnosis present

## 2022-11-14 DIAGNOSIS — I7143 Infrarenal abdominal aortic aneurysm, without rupture: Secondary | ICD-10-CM | POA: Diagnosis present

## 2022-11-14 DIAGNOSIS — R0682 Tachypnea, not elsewhere classified: Secondary | ICD-10-CM | POA: Diagnosis not present

## 2022-11-14 DIAGNOSIS — R634 Abnormal weight loss: Secondary | ICD-10-CM | POA: Diagnosis present

## 2022-11-14 DIAGNOSIS — Z886 Allergy status to analgesic agent status: Secondary | ICD-10-CM | POA: Diagnosis not present

## 2022-11-14 DIAGNOSIS — E43 Unspecified severe protein-calorie malnutrition: Secondary | ICD-10-CM | POA: Diagnosis present

## 2022-11-14 DIAGNOSIS — K2970 Gastritis, unspecified, without bleeding: Secondary | ICD-10-CM | POA: Diagnosis present

## 2022-11-14 DIAGNOSIS — I10 Essential (primary) hypertension: Secondary | ICD-10-CM | POA: Diagnosis present

## 2022-11-14 DIAGNOSIS — K559 Vascular disorder of intestine, unspecified: Secondary | ICD-10-CM | POA: Diagnosis present

## 2022-11-14 DIAGNOSIS — Z515 Encounter for palliative care: Secondary | ICD-10-CM

## 2022-11-14 DIAGNOSIS — E871 Hypo-osmolality and hyponatremia: Secondary | ICD-10-CM | POA: Diagnosis present

## 2022-11-14 DIAGNOSIS — Z7189 Other specified counseling: Secondary | ICD-10-CM | POA: Diagnosis not present

## 2022-11-14 DIAGNOSIS — D62 Acute posthemorrhagic anemia: Secondary | ICD-10-CM | POA: Diagnosis present

## 2022-11-14 DIAGNOSIS — B9681 Helicobacter pylori [H. pylori] as the cause of diseases classified elsewhere: Secondary | ICD-10-CM | POA: Diagnosis present

## 2022-11-14 DIAGNOSIS — K567 Ileus, unspecified: Secondary | ICD-10-CM | POA: Diagnosis present

## 2022-11-14 DIAGNOSIS — Z6820 Body mass index (BMI) 20.0-20.9, adult: Secondary | ICD-10-CM

## 2022-11-14 DIAGNOSIS — Z79899 Other long term (current) drug therapy: Secondary | ICD-10-CM

## 2022-11-14 DIAGNOSIS — E876 Hypokalemia: Secondary | ICD-10-CM | POA: Diagnosis not present

## 2022-11-14 DIAGNOSIS — Z597 Insufficient social insurance and welfare support: Secondary | ICD-10-CM

## 2022-11-14 DIAGNOSIS — K922 Gastrointestinal hemorrhage, unspecified: Secondary | ICD-10-CM | POA: Diagnosis present

## 2022-11-14 DIAGNOSIS — K5939 Other megacolon: Secondary | ICD-10-CM | POA: Diagnosis not present

## 2022-11-14 DIAGNOSIS — Z720 Tobacco use: Secondary | ICD-10-CM | POA: Diagnosis present

## 2022-11-14 DIAGNOSIS — F17210 Nicotine dependence, cigarettes, uncomplicated: Secondary | ICD-10-CM | POA: Diagnosis present

## 2022-11-14 DIAGNOSIS — Z66 Do not resuscitate: Secondary | ICD-10-CM | POA: Diagnosis present

## 2022-11-14 DIAGNOSIS — K254 Chronic or unspecified gastric ulcer with hemorrhage: Secondary | ICD-10-CM | POA: Diagnosis present

## 2022-11-14 DIAGNOSIS — R000 Tachycardia, unspecified: Secondary | ICD-10-CM | POA: Diagnosis not present

## 2022-11-14 DIAGNOSIS — Z91041 Radiographic dye allergy status: Secondary | ICD-10-CM | POA: Diagnosis not present

## 2022-11-14 DIAGNOSIS — K449 Diaphragmatic hernia without obstruction or gangrene: Secondary | ICD-10-CM | POA: Diagnosis present

## 2022-11-14 DIAGNOSIS — Z880 Allergy status to penicillin: Secondary | ICD-10-CM

## 2022-11-14 LAB — COMPREHENSIVE METABOLIC PANEL
ALT: 11 U/L (ref 0–44)
AST: 33 U/L (ref 15–41)
Albumin: 2.8 g/dL — ABNORMAL LOW (ref 3.5–5.0)
Alkaline Phosphatase: 57 U/L (ref 38–126)
Anion gap: 10 (ref 5–15)
BUN: 48 mg/dL — ABNORMAL HIGH (ref 6–20)
CO2: 23 mmol/L (ref 22–32)
Calcium: 8.3 mg/dL — ABNORMAL LOW (ref 8.9–10.3)
Chloride: 98 mmol/L (ref 98–111)
Creatinine, Ser: 0.65 mg/dL (ref 0.44–1.00)
GFR, Estimated: >60 mL/min (ref >60)
Glucose, Bld: 124 mg/dL — ABNORMAL HIGH (ref 70–99)
Potassium: 4 mmol/L (ref 3.5–5.1)
Sodium: 131 mmol/L — ABNORMAL LOW (ref 135–145)
Total Bilirubin: 0.5 mg/dL (ref 0.3–1.2)
Total Protein: 6.4 g/dL — ABNORMAL LOW (ref 6.5–8.1)

## 2022-11-14 LAB — CBC
HCT: 31 % — ABNORMAL LOW (ref 36.0–46.0)
HCT: 25.2 % — ABNORMAL LOW (ref 36.0–46.0)
Hemoglobin: 9.9 g/dL — ABNORMAL LOW (ref 12.0–15.0)
Hemoglobin: 8.1 g/dL — ABNORMAL LOW (ref 12.0–15.0)
MCH: 28.6 pg (ref 26.0–34.0)
MCH: 28 pg (ref 26.0–34.0)
MCHC: 31.9 g/dL (ref 30.0–36.0)
MCHC: 32.1 g/dL (ref 30.0–36.0)
MCV: 89.6 fL (ref 80.0–100.0)
MCV: 87.2 fL (ref 80.0–100.0)
Platelets: 387 10*3/uL (ref 150–400)
Platelets: 286 10*3/uL (ref 150–400)
RBC: 3.46 MIL/uL — ABNORMAL LOW (ref 3.87–5.11)
RBC: 2.89 MIL/uL — ABNORMAL LOW (ref 3.87–5.11)
RDW: 14.3 % (ref 11.5–15.5)
RDW: 14.1 % (ref 11.5–15.5)
WBC: 18.6 10*3/uL — ABNORMAL HIGH (ref 4.0–10.5)
WBC: 16.2 10*3/uL — ABNORMAL HIGH (ref 4.0–10.5)
nRBC: 0 % (ref 0.0–0.2)
nRBC: 0 % (ref 0.0–0.2)

## 2022-11-14 LAB — PROTIME-INR
INR: 1.1 (ref 0.8–1.2)
Prothrombin Time: 14.1 seconds (ref 11.4–15.2)

## 2022-11-14 LAB — LIPASE, BLOOD: Lipase: 23 U/L (ref 11–51)

## 2022-11-14 LAB — MRSA NEXT GEN BY PCR, NASAL: MRSA by PCR Next Gen: NOT DETECTED

## 2022-11-14 LAB — TSH: TSH: 0.466 u[IU]/mL (ref 0.350–4.500)

## 2022-11-14 MED ORDER — LACTATED RINGERS IV BOLUS
1000.0000 mL | Freq: Once | INTRAVENOUS | Status: AC
  Administered 2022-11-14: 1000 mL via INTRAVENOUS

## 2022-11-14 MED ORDER — SODIUM CHLORIDE 0.9% FLUSH
3.0000 mL | Freq: Two times a day (BID) | INTRAVENOUS | Status: DC
  Administered 2022-11-14 – 2022-11-24 (×16): 3 mL via INTRAVENOUS

## 2022-11-14 MED ORDER — HYDROMORPHONE HCL 1 MG/ML IJ SOLN
0.5000 mg | Freq: Once | INTRAMUSCULAR | Status: AC
  Administered 2022-11-14: 0.5 mg via INTRAVENOUS
  Filled 2022-11-14: qty 0.5

## 2022-11-14 MED ORDER — PANTOPRAZOLE 80MG IVPB - SIMPLE MED
80.0000 mg | Freq: Once | INTRAVENOUS | Status: AC
  Administered 2022-11-14: 80 mg via INTRAVENOUS
  Filled 2022-11-14: qty 100

## 2022-11-14 MED ORDER — ORAL CARE MOUTH RINSE: 15.0000 mL | OROMUCOSAL | Status: DC | PRN

## 2022-11-14 MED ORDER — LACTATED RINGERS IV SOLN: INTRAVENOUS | Status: DC

## 2022-11-14 MED ORDER — PANTOPRAZOLE SODIUM 40 MG IV SOLR: 40.0000 mg | Freq: Two times a day (BID) | INTRAVENOUS | Status: DC

## 2022-11-14 MED ORDER — THIAMINE HCL 100 MG/ML IJ SOLN
100.0000 mg | Freq: Every day | INTRAMUSCULAR | Status: DC
  Administered 2022-11-14 – 2022-11-23 (×10): 100 mg via INTRAVENOUS
  Filled 2022-11-14 (×10): qty 2

## 2022-11-14 MED ORDER — HYDROMORPHONE HCL 1 MG/ML IJ SOLN
0.5000 mg | INTRAMUSCULAR | Status: DC | PRN
  Administered 2022-11-14 – 2022-11-16 (×12): 0.5 mg via INTRAVENOUS
  Filled 2022-11-14 (×2): qty 1
  Filled 2022-11-14 (×2): qty 0.5
  Filled 2022-11-14 (×2): qty 1
  Filled 2022-11-14 (×3): qty 0.5
  Filled 2022-11-14: qty 1
  Filled 2022-11-14: qty 0.5
  Filled 2022-11-14: qty 1

## 2022-11-14 MED ORDER — PANTOPRAZOLE INFUSION (NEW) - SIMPLE MED
8.0000 mg/h | INTRAVENOUS | Status: DC
  Administered 2022-11-14 – 2022-11-15 (×3): 8 mg/h via INTRAVENOUS
  Filled 2022-11-14 (×4): qty 100

## 2022-11-14 MED ORDER — ONDANSETRON HCL 4 MG PO TABS: 4.0000 mg | ORAL_TABLET | Freq: Four times a day (QID) | ORAL | Status: DC | PRN

## 2022-11-14 MED ORDER — ONDANSETRON HCL 4 MG/2ML IJ SOLN: 4.0000 mg | Freq: Once | INTRAMUSCULAR | Status: DC

## 2022-11-14 MED ORDER — FOLIC ACID 5 MG/ML IJ SOLN
1.0000 mg | Freq: Every day | INTRAMUSCULAR | Status: DC
  Administered 2022-11-14 – 2022-11-23 (×10): 1 mg via INTRAVENOUS
  Filled 2022-11-14 (×11): qty 0.2

## 2022-11-14 MED ORDER — PANTOPRAZOLE INFUSION (NEW) - SIMPLE MED
8.0000 mg/h | INTRAVENOUS | Status: DC
  Filled 2022-11-14: qty 100

## 2022-11-14 MED ORDER — NICOTINE 14 MG/24HR TD PT24
14.0000 mg | MEDICATED_PATCH | Freq: Every day | TRANSDERMAL | Status: DC
  Administered 2022-11-14 – 2022-11-24 (×11): 14 mg via TRANSDERMAL
  Filled 2022-11-14 (×11): qty 1

## 2022-11-14 MED ORDER — ONDANSETRON HCL 4 MG/2ML IJ SOLN: 4.0000 mg | Freq: Four times a day (QID) | INTRAMUSCULAR | Status: DC | PRN

## 2022-11-14 NOTE — Assessment & Plan Note (Addendum)
In the setting of upper GI bleed.  Hemoglobin on admission 9.9, previously 14.3 approximately 3 months ago.  Patient is tachycardic and tachypneic, however blood pressure is remaining stable at this time.  High risk for decompensation.  Will transfuse 1 PRBC for hemoglobin less than 7     Latest Ref Rng & Units 11/15/2022    9:50 AM 11/15/2022    4:37 AM 11/14/2022   10:33 PM  CBC  WBC 4.0 - 10.5 K/uL 16.8  17.4  16.2   Hemoglobin 12.0 - 15.0 g/dL 7.6  8.0  8.1   Hematocrit 36.0 - 46.0 % 23.8  24.6  25.2   Platelets 150 - 400 K/uL 264  271  286

## 2022-11-14 NOTE — H&P (Addendum)
History and Physical    Patient: Erika Downs [id number] DOB: 1961-01-19 DOA: 11/14/2022 DOS: the patient was seen and examined on 11/14/2022 PCP: Pcp, No  Patient coming from: Home  Chief Complaint:  Chief Complaint  Patient presents with   Abdominal Pain   HPI: Erika Downs is a 62 y.o. female with medical history significant of hyperlipidemia who presents to the ED due to abdominal pain and bloody vomit.  Ms. Yandow states that she has been experiencing nausea with vomiting that began suddenly approximately 3 days ago.  Shortly thereafter, she developed diffuse abdominal pain that is worst in the epigastric region.  Then last night, her emesis turned bright red and has been bloody ever since.  She states she had innumerable episodes of hematemesis last night and began to have black diarrhea this morning.  She endorses palpitations but denies chest pain or shortness of breath.  She was unaware of previous CT findings demonstrating ulceration.  She denies any recent NSAID use, alcohol use.  She notes that she has been losing weight unintentionally in addition to a poor appetite over the last several months.  ED course: On arrival to the ED, patient was normotensive at 126/73 with heart rate of 108.  She was saturating at 100% with respiratory rate of 30.  She was afebrile at 97.6.  Initial workup remarkable for WBC of 18.6, hemoglobin of 9.9, sodium of 131, potassium 4.0, BUN 48, creatinine 0.65, and GFR above 60. CT of the abdomen was obtained that demonstrated a progressive gastric antral ulcer with adjacent mural thickening with persistent adjacent fat stranding without evidence of frank perforation or pneumoperitoneum.  High attenuation material in the stomach could reflect blood products and active hemorrhage.  Patient started on Protonix.  GI consulted.  TRH contacted for admission.  Review of Systems: As mentioned in the history of present illness. All other systems reviewed and  are negative.  Past Medical History:  Diagnosis Date   Chest pain    Hyperlipidemia    Past Surgical History:  Procedure Laterality Date   BLADDER SUSPENSION     NOVASURE ABLATION     Social History:  reports that she has been smoking cigarettes. She has been smoking an average of .5 packs per day. She has never used smokeless tobacco. She reports that she does not currently use alcohol. She reports that she does not use drugs.  Allergies  Allergen Reactions   Ibuprofen Hives and Swelling   Ivp Dye [Iodinated Contrast Media] Hives    PT states she was injected at [facility] and had hives   Penicillins Hives and Swelling    Has patient had a PCN reaction causing immediate rash, facial/tongue/throat swelling, SOB or lightheadedness with hypotension: Yes Has patient had a PCN reaction causing severe rash involving mucus membranes or skin necrosis: No Has patient had a PCN reaction that required hospitalization No Has patient had a PCN reaction occurring within the last 10 years: No If all of the above answers are "NO", then may proceed with Cephalosporin use.     No family history on file.  Prior to Admission medications   Medication Sig Start Date End Date Taking? Authorizing Provider  diazepam (VALIUM) 5 MG tablet Take 1 tablet (5 mg total) by mouth every 8 (eight) hours as needed for muscle spasms. Patient not taking: Reported on 04/01/2018 12/10/16   Earleen Newport, MD  famotidine (PEPCID) 20 MG tablet Take 1 tablet (20 mg total) by mouth 2 (  two) times daily. 09/22/21   Carrie Mew, MD  ibuprofen (ADVIL,MOTRIN) 600 MG tablet Take 1 tablet (600 mg total) by mouth every 6 (six) hours as needed. Patient not taking: Reported on 04/01/2018 05/20/17   Laban Emperor, PA-C  metoCLOPramide (REGLAN) 10 MG tablet Take 1 tablet (10 mg total) by mouth every 6 (six) hours as needed. 09/22/21   Carrie Mew, MD  ondansetron (ZOFRAN-ODT) 4 MG disintegrating tablet Take 1 tablet (4 mg  total) by mouth every 8 (eight) hours as needed for nausea or vomiting. 04/06/22   Carrie Mew, MD  oseltamivir (TAMIFLU) 75 MG capsule Take 1 capsule (75 mg total) by mouth every 12 (twelve) hours. 09/14/22   Brimage, Ronnette Juniper, DO  sucralfate (CARAFATE) 1 g tablet Take 1 tablet (1 g total) by mouth 4 (four) times daily. 09/22/21   Carrie Mew, MD  traMADol (ULTRAM) 50 MG tablet Take 1 tablet (50 mg total) by mouth every 6 (six) hours as needed. 12/29/21 12/29/22  Nance Pear, MD    Physical Exam: Vitals:   11/14/22 1600 11/14/22 1604 11/14/22 1730 11/14/22 1800  BP:  126/73 120/78 134/89  Pulse:  (!) 108 (!) 102 (!) 101  Resp:  (!) 30 19 (!) 24  Temp:  97.6 F (36.4 C)  (P) 98.2 F (36.8 C)  TempSrc:  Oral  (P) Oral  SpO2:  100% 99% 100%  Weight: 44 kg     Height: 5' 2"$  (1.575 m)      Physical Exam Vitals and nursing note reviewed.  Constitutional:      Appearance: She is underweight. She is ill-appearing.  HENT:     Head: Normocephalic and atraumatic.     Mouth/Throat:     Mouth: Mucous membranes are dry.     Pharynx: Oropharynx is clear.  Eyes:     Pupils: Pupils are equal, round, and reactive to light.  Cardiovascular:     Rate and Rhythm: Regular rhythm. Tachycardia present.     Heart sounds: No murmur heard. Pulmonary:     Effort: Pulmonary effort is normal. Tachypnea present. No respiratory distress.     Breath sounds: Normal breath sounds. No wheezing, rhonchi or rales.  Abdominal:     General: Bowel sounds are absent. There is no distension.     Palpations: Abdomen is soft.     Tenderness: There is generalized abdominal tenderness (Worse in the epigastric and left upper quadrant). There is guarding.     Hernia: No hernia is present.  Musculoskeletal:     Right lower leg: No edema.     Left lower leg: No edema.  Skin:    General: Skin is dry.     Coloration: Skin is pale.  Neurological:     General: No focal deficit present.     Mental Status: She  is alert and oriented to person, place, and time. Mental status is at baseline.  Psychiatric:        Mood and Affect: Mood normal.        Behavior: Behavior normal.    Data Reviewed: CBC with WBC of 18.6, hemoglobin of 9.9 and platelets of 387.  Previous CBC obtained on 09/20/2022 with hemoglobin of 14.3. CMP with sodium of 131, potassium 4.0, bicarb 23, glucose 124, BUN 48, creatinine 0.65, calcium 8.3, albumin 2.8, total protein 6.4, AST 33, ALT 11 and GFR above 60  EKG personally reviewed.  Sinus rhythm with rate of 101.  No ST or T wave changes concerning for acute  ischemia.  CT ABDOMEN PELVIS WO CONTRAST  Result Date: 11/14/2022 CLINICAL DATA:  Gastrointestinal bleeding, abdominal pain for 1 week, hematemesis, dark stools EXAM: CT ABDOMEN AND PELVIS WITHOUT CONTRAST TECHNIQUE: Multidetector CT imaging of the abdomen and pelvis was performed following the standard protocol without IV contrast. Unenhanced CT was performed per clinician order. Lack of IV contrast limits sensitivity and specificity, especially for evaluation of abdominal/pelvic solid viscera. Specifically, gastrointestinal bleeding cannot be evaluated without IV contrast. RADIATION DOSE REDUCTION: This exam was performed according to the departmental dose-optimization program which includes automated exposure control, adjustment of the mA and/or kV according to patient size and/or use of iterative reconstruction technique. COMPARISON:  04/06/2022 FINDINGS: Lower chest: No acute pleural or parenchymal lung disease. Hepatobiliary: Unremarkable unenhanced appearance of the liver and gallbladder. Pancreas: Unremarkable unenhanced appearance. Spleen: Unremarkable unenhanced appearance. Adrenals/Urinary Tract: No urinary tract calculi or obstructive uropathy within either kidney. The adrenals and bladder are unremarkable. Stomach/Bowel: There is persistent abnormality of the gastric antrum and pylorus as seen on prior study. There is  abnormal wall thickening throughout the distal antrum and pylorus, with evidence of focal ulceration along the greater curvature of the stomach in the region the gastric antrum, best seen on image 32 of coronal series 5. There is adjacent stranding within the mesenteric fat. High attenuation in this region and within the gastric lumen is nonspecific, but could reflect blood products given history of hematemesis. Correlation with endoscopy is recommended if not previously performed. No bowel obstruction or ileus.  Normal retrocecal appendix. Vascular/Lymphatic: Diffuse atherosclerosis of the abdominal aorta is again noted. 3.1 cm infrarenal abdominal aortic aneurysm is again seen, unchanged. Evaluation of the vascular lumen is limited without IV contrast. No pathologic adenopathy within the abdomen or pelvis. Subcentimeter lymph nodes surrounding the distal stomach are nonspecific, largest measuring 8 mm in short axis reference image 27/2. Reproductive: Uterus and bilateral adnexa are unremarkable. Other: There is no free fluid or free intraperitoneal gas. No abdominal wall hernia. Musculoskeletal: No acute or destructive bony lesions. Reconstructed images demonstrate no additional findings. IMPRESSION: 1. Progressive gastric antral ulcer, with adjacent mural thickening throughout the distal antrum and pylorus. Differential includes progressive peptic ulcer disease versus underlying mass and malignant ulceration. There is persistent adjacent fat stranding, without evidence of frank perforation or pneumoperitoneum. High attenuation material within the lumen of the stomach adjacent to the ulceration could reflect blood products and active hemorrhage. Correlation with endoscopy is recommended for further evaluation. 2. Nonspecific subcentimeter mesenteric lymph nodes surrounding the gastric antrum. 3. 3.1 cm infrarenal abdominal aortic aneurysm. Recommend follow-up every 3 years. Reference: J Am Coll Radiol  [id number]. 4.  Aortic Atherosclerosis (ICD10-I70.0). These results were called by telephone at the time of interpretation on 11/14/2022 at 5:03 pm to provider [facility] , who verbally acknowledged these results. Electronically Signed   By: Randa Ngo M.D.   On: 11/14/2022 17:09   DG Chest Portable 1 View  Result Date: 11/14/2022 CLINICAL DATA:  Abdominal pain, hematemesis EXAM: PORTABLE CHEST 1 VIEW COMPARISON:  12/28/2021 FINDINGS: Single frontal view of the chest demonstrates an unremarkable cardiac silhouette. No airspace disease, effusion, or pneumothorax. No acute bony abnormality. Chronic posttraumatic changes distal right clavicle. IMPRESSION: 1. No acute intrathoracic process. Electronically Signed   By: Randa Ngo M.D.   On: 11/14/2022 16:42    There are no new results to review at this time.  Assessment and Plan:  * Acute upper GI bleed Patient presenting with 3 days of  vomiting and abdominal pain, with hematemesis developing last night in addition to melena.  CT with evidence of progressive gastric antral ulcer with mural thickening and adjacent fat stranding.  Lumen of the stomach with evidence of blood products concerning for active hemorrhage.  Unfortunately, patient is allergic to contrast, precluding the possibility of IR embolization.  - Admit to stepdown given high risk for hemorrhagic shock, perforation - GI consulted; appreciate their recommendations - Plan for EGD in the a.m. unless patient develops active hematemesis or hemodynamic instability - GI requesting Protonix infusion over Protonix 40 mg IV BID - Telemetry monitoring - LR bolus followed by LR infusion  Acute blood loss anemia In the setting of upper GI bleed.  Hemoglobin on admission 9.9, previously 14.3 approximately 3 months ago.  Patient is tachycardic and tachypneic, however blood pressure is remaining stable at this time.  High risk for decompensation  - CBC every 6 hours - Transfuse for  hemoglobin less than 7 or if hemodynamic instability occurs  Unintentional weight loss Patient reports several month history of unintentional weight loss.  Per chart review, patient weighed 59 kg in April 2023 and currently weighs approximately 44 kg.  Given lack of age-appropriate cancer screening, this is concerning for an occult malignancy.   - Follow-up EGD biopsy results if obtained - Dietitian consulted - Recommend outpatient establishment with PCP  Tobacco use - Nicotine patch ordered  Advance Care Planning:   Code Status: DNR.  Patient states that if she had a cardiac arrest and was clinically dead, she would not want to be revived.  She states that her quality of life has been very poor for several years now and she does not feel she could return to meaningful living.  She would want every other intervention offered, including intubation in the case of pulmonary arrest only, pressor support, surgery.  Consults: Gastroenterology  Family Communication: Patient's boyfriend updated at bedside  Severity of Illness: The appropriate patient status for this patient is INPATIENT. Inpatient status is judged to be reasonable and necessary in order to provide the required intensity of service to ensure the patient's safety. The patient's presenting symptoms, physical exam findings, and initial radiographic and laboratory data in the context of their chronic comorbidities is felt to place them at high risk for further clinical deterioration. Furthermore, it is not anticipated that the patient will be medically stable for discharge from the hospital within 2 midnights of admission.   * I certify that at the point of admission it is my clinical judgment that the patient will require inpatient hospital care spanning beyond 2 midnights from the point of admission due to high intensity of service, high risk for further deterioration and high frequency of surveillance required.*  Author: Jose Persia, MD 11/14/2022 6:55 PM  For on call review [email or web address].

## 2022-11-14 NOTE — ED Provider Notes (Signed)
[facility] Provider Note    Event Date/Time   First MD Initiated Contact with Patient 11/14/22 1603     (approximate)   History   Abdominal Pain   HPI  Erika Downs is a 62 y.o. female past medical history of hyperlipidemia presents with abdominal pain and hematemesis.  Patient tells me that she has had pain in the abdomen diffusely for several days.  Has had multiple episodes of emesis for about 3 days.  Last night started vomiting blood.  Says it looks both dark and bright red at times.  Has had more episodes than she can recall.  Also says that stools looked black since yesterday.  Pain radiates to the back.  Denies drug or alcohol use.  Denies history of GI bleeding or history of ulcers.  Denies any NSAID or over-the-counter pain medication use.     Past Medical History:  Diagnosis Date   Chest pain    Hyperlipidemia     Patient Active Problem List   Diagnosis Date Noted   Chest pain 09/25/2011     Physical Exam  Triage Vital Signs: ED Triage Vitals  Enc Vitals Group     BP 11/14/22 1604 126/73     Pulse Rate 11/14/22 1604 (!) 108     Resp 11/14/22 1604 (!) 30     Temp 11/14/22 1604 97.6 F (36.4 C)     Temp Source 11/14/22 1604 Oral     SpO2 11/14/22 1559 99 %     Weight 11/14/22 1600 97 lb (44 kg)     Height 11/14/22 1600 5' 2"$  (1.575 m)     Head Circumference --      Peak Flow --      Pain Score 11/14/22 1600 4     Pain Loc --      Pain Edu? --      Excl. in [place]? --     Most recent vital signs: Vitals:   11/14/22 1559 11/14/22 1604  BP:  126/73  Pulse:  (!) 108  Resp:  (!) 30  Temp:  97.6 F (36.4 C)  SpO2: 99% 100%     General: Awake, patient looks unwell, pale, uncomfortable CV:  Good peripheral perfusion.  Resp:  Normal effort.  Abd:  No distention.  Abdomen is diffusely tender with involuntary guarding Neuro:             Awake, Alert, Oriented x 3  Other:  Melena on rectal exam   ED Results / Procedures /  Treatments  Labs (all labs ordered are listed, but only abnormal results are displayed) Labs Reviewed  COMPREHENSIVE METABOLIC PANEL - Abnormal; Notable for the following components:      Result Value   Sodium 131 (*)    Glucose, Bld 124 (*)    BUN 48 (*)    Calcium 8.3 (*)    Total Protein 6.4 (*)    Albumin 2.8 (*)    All other components within normal limits  CBC - Abnormal; Notable for the following components:   WBC 18.6 (*)    RBC 3.46 (*)    Hemoglobin 9.9 (*)    HCT 31.0 (*)    All other components within normal limits  LIPASE, BLOOD  URINALYSIS, ROUTINE W REFLEX MICROSCOPIC  POC URINE PREG, ED  TYPE AND SCREEN     EKG     RADIOLOGY I reviewed and interpreted the chest x-ray which shows no free air  PROCEDURES:  Critical Care performed: Yes, see critical care procedure note(s)  Procedures  The patient is on the cardiac monitor to evaluate for evidence of arrhythmia and/or significant heart rate changes.   MEDICATIONS ORDERED IN ED: Medications  pantoprozole (PROTONIX) 80 mg /NS 100 mL infusion (has no administration in time range)  pantoprazole (PROTONIX) injection 40 mg (has no administration in time range)  pantoprazole (PROTONIX) 80 mg /NS 100 mL IVPB (80 mg Intravenous New Bag/Given 11/14/22 1635)  HYDROmorphone (DILAUDID) injection 0.5 mg (0.5 mg Intravenous Given 11/14/22 1634)  lactated ringers bolus 1,000 mL (1,000 mLs Intravenous New Bag/Given 11/14/22 1635)     IMPRESSION / MDM / ASSESSMENT AND PLAN / ED COURSE  I reviewed the triage vital signs and the nursing notes.                              Patient's presentation is most consistent with acute presentation with potential threat to life or bodily function.  Differential diagnosis includes, but is not limited to, perforated ulcer, bleeding ulcer, gastritis, Mallory-Weiss tear, esophageal varices  Patient is a 62 year old female presents with several days of a diffuse abdominal pain  nausea vomiting and hematemesis and tarry stools since yesterday.  Patient notes that she had nonbloody nausea and vomiting for several days last which turned to blood last night and since has had multiple episodes of what she describes as more bright red and dark emesis and black stool.  She has no history of GI bleeding no history of liver disease not anticoagulated.  On arrival to ED patient is mildly tachycardic tachypneic she looks unwell and uncomfortable.  Abdominal exam is concerning for diffuse abdominal tenderness with involuntary guarding.  Patient would not let me perform rectal exam prior to administering pain medication.  And concern for possible perforated viscus and upper GI bleed.  Will get upright chest x-ray CT abdomen pelvis without contrast given patient has a contrast allergy do not want to delay scan for pretreatment.  Will give Protonix Dilaudid and a bolus of fluid.  Type and screen sent.  Patient has a leukocytosis to about 19, hemoglobin is 9.9 was 14.60-monthago.  CT of the abdomen pelvis was obtained given patient significant tenderness on exam.  Radiology called me.  There is progressive antral ulcer but does have surrounding thickening and stranding but no frank evidence of perforation no free air.  There is high density material in the stomach which likely reflects blood products.  CT was obtained without contrast given patient's contrast allergy did not want to wait for pretreatment.  I discussed with Dr. TAlice Reichertwith GI who is aware of the patient.  She remains hemodynamically stable.  No active hematemesis yet in the ED.  Will start Protonix drip.     FINAL CLINICAL IMPRESSION(S) / ED DIAGNOSES   Final diagnoses:  UGIB (upper gastrointestinal bleed)     Rx / DC Orders   ED Discharge Orders     None        Note:  This document was prepared using Dragon voice recognition software and may include unintentional dictation errors.   MRada Hay  MD 11/14/22 1726

## 2022-11-14 NOTE — ED Triage Notes (Signed)
Pt presents to the ED via [facility] due to possible GI bleed and emesis. Pt states the abdominal pain has been going on x1 week. Pt states she is vomiting blood and having dark tarry stools. Pt denies blood thinners. Pt A&Ox4

## 2022-11-14 NOTE — Assessment & Plan Note (Signed)
Patient reports several month history of unintentional weight loss.  Per chart review, patient weighed 59 kg in April 2023 and currently weighs approximately 44 kg.  Given lack of age-appropriate cancer screening, this is concerning for an occult malignancy.   - Follow-up EGD biopsy results if obtained - Dietitian consulted - Recommend outpatient establishment with PCP

## 2022-11-14 NOTE — Assessment & Plan Note (Addendum)
Patient presenting with 3 days of vomiting and abdominal pain, with hematemesis developing last night in addition to melena.  CT with evidence of progressive gastric antral ulcer with mural thickening and adjacent fat stranding.  Lumen of the stomach with evidence of blood products concerning for active hemorrhage.  Unfortunately, patient is allergic to contrast, precluding the possibility of IR embolization.  - Transfer to medical floor after EGD - GI performing EGD this afternoon - Continue Protonix infusion and transition to Protonix 40 mg IV BID depending on EGD findings

## 2022-11-14 NOTE — Assessment & Plan Note (Signed)
-   Nicotine patch ordered, counseled 

## 2022-11-15 ENCOUNTER — Encounter: Payer: Self-pay | Admitting: Internal Medicine

## 2022-11-15 ENCOUNTER — Inpatient Hospital Stay: Payer: Medicaid Other | Admitting: Certified Registered"

## 2022-11-15 ENCOUNTER — Encounter
Admission: EM | Disposition: A | Discharge status: Home / Self Care | Payer: Self-pay | Source: Home / Self Care | Attending: Internal Medicine

## 2022-11-15 DIAGNOSIS — Z720 Tobacco use: Secondary | ICD-10-CM

## 2022-11-15 HISTORY — PX: ESOPHAGOGASTRODUODENOSCOPY (EGD) WITH PROPOFOL: SHX5813

## 2022-11-15 LAB — BASIC METABOLIC PANEL
Anion gap: 5 (ref 5–15)
BUN: 29 mg/dL — ABNORMAL HIGH (ref 6–20)
CO2: 27 mmol/L (ref 22–32)
Calcium: 7.7 mg/dL — ABNORMAL LOW (ref 8.9–10.3)
Chloride: 103 mmol/L (ref 98–111)
Creatinine, Ser: 0.5 mg/dL (ref 0.44–1.00)
GFR, Estimated: >60 mL/min (ref >60)
Glucose, Bld: 120 mg/dL — ABNORMAL HIGH (ref 70–99)
Potassium: 3.5 mmol/L (ref 3.5–5.1)
Sodium: 135 mmol/L (ref 135–145)

## 2022-11-15 LAB — CBC
HCT: 24.6 % — ABNORMAL LOW (ref 36.0–46.0)
HCT: 23.8 % — ABNORMAL LOW (ref 36.0–46.0)
HCT: 22 % — ABNORMAL LOW (ref 36.0–46.0)
Hemoglobin: 8 g/dL — ABNORMAL LOW (ref 12.0–15.0)
Hemoglobin: 7.6 g/dL — ABNORMAL LOW (ref 12.0–15.0)
Hemoglobin: 7 g/dL — ABNORMAL LOW (ref 12.0–15.0)
MCH: 28.7 pg (ref 26.0–34.0)
MCH: 28.1 pg (ref 26.0–34.0)
MCH: 28.2 pg (ref 26.0–34.0)
MCHC: 32.5 g/dL (ref 30.0–36.0)
MCHC: 31.9 g/dL (ref 30.0–36.0)
MCHC: 31.8 g/dL (ref 30.0–36.0)
MCV: 88.2 fL (ref 80.0–100.0)
MCV: 88.1 fL (ref 80.0–100.0)
MCV: 88.7 fL (ref 80.0–100.0)
Platelets: 271 10*3/uL (ref 150–400)
Platelets: 264 10*3/uL (ref 150–400)
Platelets: 239 10*3/uL (ref 150–400)
RBC: 2.79 MIL/uL — ABNORMAL LOW (ref 3.87–5.11)
RBC: 2.7 MIL/uL — ABNORMAL LOW (ref 3.87–5.11)
RBC: 2.48 MIL/uL — ABNORMAL LOW (ref 3.87–5.11)
RDW: 14.1 % (ref 11.5–15.5)
RDW: 14.3 % (ref 11.5–15.5)
RDW: 14.3 % (ref 11.5–15.5)
WBC: 17.4 10*3/uL — ABNORMAL HIGH (ref 4.0–10.5)
WBC: 16.8 10*3/uL — ABNORMAL HIGH (ref 4.0–10.5)
WBC: 15.1 10*3/uL — ABNORMAL HIGH (ref 4.0–10.5)
nRBC: 0 % (ref 0.0–0.2)
nRBC: 0 % (ref 0.0–0.2)
nRBC: 0 % (ref 0.0–0.2)

## 2022-11-15 LAB — PREPARE RBC (CROSSMATCH)

## 2022-11-15 LAB — HIV ANTIBODY (ROUTINE TESTING W REFLEX): HIV Screen 4th Generation wRfx: NONREACTIVE

## 2022-11-15 LAB — ABO/RH: ABO/RH(D): A POS

## 2022-11-15 SURGERY — ESOPHAGOGASTRODUODENOSCOPY (EGD) WITH PROPOFOL
Anesthesia: General

## 2022-11-15 MED ORDER — MIDAZOLAM HCL 2 MG/2ML IJ SOLN
INTRAMUSCULAR | Status: DC | PRN
  Administered 2022-11-15: 2 mg via INTRAVENOUS

## 2022-11-15 MED ORDER — MIDAZOLAM HCL 2 MG/2ML IJ SOLN
INTRAMUSCULAR | Status: AC
  Filled 2022-11-15: qty 2

## 2022-11-15 MED ORDER — ACETAMINOPHEN 325 MG PO TABS
650.0000 mg | ORAL_TABLET | Freq: Four times a day (QID) | ORAL | Status: DC | PRN
  Administered 2022-11-15 – 2022-11-23 (×3): 650 mg via ORAL
  Filled 2022-11-15 (×4): qty 2

## 2022-11-15 MED ORDER — HYDROCODONE-ACETAMINOPHEN 5-325 MG PO TABS: 1.0000 | ORAL_TABLET | ORAL | Status: DC | PRN

## 2022-11-15 MED ORDER — PHENYLEPHRINE HCL (PRESSORS) 10 MG/ML IV SOLN
INTRAVENOUS | Status: DC | PRN
  Administered 2022-11-15 (×4): 200 ug via INTRAVENOUS

## 2022-11-15 MED ORDER — PROPOFOL 10 MG/ML IV BOLUS
INTRAVENOUS | Status: DC | PRN
  Administered 2022-11-15 (×2): 50 mg via INTRAVENOUS
  Administered 2022-11-15: 20 mg via INTRAVENOUS
  Administered 2022-11-15: 30 mg via INTRAVENOUS

## 2022-11-15 MED ORDER — CHLORHEXIDINE GLUCONATE CLOTH 2 % EX PADS
6.0000 | MEDICATED_PAD | Freq: Every day | CUTANEOUS | Status: DC
  Administered 2022-11-15: 6 via TOPICAL

## 2022-11-15 MED ORDER — SODIUM CHLORIDE 0.9% IV SOLUTION: Freq: Once | INTRAVENOUS | Status: AC

## 2022-11-15 MED ORDER — PROPOFOL 10 MG/ML IV BOLUS
INTRAVENOUS | Status: AC
  Filled 2022-11-15: qty 20

## 2022-11-15 MED ORDER — SODIUM CHLORIDE 0.9 % IV SOLN: INTRAVENOUS | Status: DC

## 2022-11-15 MED ORDER — LIDOCAINE HCL (CARDIAC) PF 100 MG/5ML IV SOSY
PREFILLED_SYRINGE | INTRAVENOUS | Status: DC | PRN
  Administered 2022-11-15: 40 mg via INTRAVENOUS

## 2022-11-15 NOTE — Interval H&P Note (Signed)
History and Physical Interval Note:  11/15/2022 1:11 PM  Erika Downs  has presented today for surgery, with the diagnosis of Abnormal CT scan, PUD, UGIB, Melena, epigastric abdominal pain.  The various methods of treatment have been discussed with the patient and family. After consideration of risks, benefits and other options for treatment, the patient has consented to  Procedure(s): ESOPHAGOGASTRODUODENOSCOPY (EGD) WITH PROPOFOL (N/A) as a surgical intervention.  The patient's history has been reviewed, patient examined, no change in status, stable for surgery.  I have reviewed the patient's chart and labs.  Questions were answered to the patient's satisfaction.     [place], [place]

## 2022-11-15 NOTE — Progress Notes (Signed)
No vomiting of any king overnight, no stools overnight either. Vital signs stable all night as well, rested quietly most of the evening unless she is in pain at the due time for pain medication.

## 2022-11-15 NOTE — Hospital Course (Signed)
62 y.o. female with medical history significant of hyperlipidemia who presents to the ED due to abdominal pain and bloody vomit  Admitted for acute blood loss anemia likely due to upper GI bleed  2/18: EGD, transfer to floor 2/19: EGD showing 2 large ulcer (nonbleeding) and heaped up edges at the ulcer concerning for malignancy.  Pending biopsy, oncology consult 2/20: Still in a lot of pain, increase the dose of Dilaudid and oxycodone, palliative care consult

## 2022-11-15 NOTE — Op Note (Signed)
[facility] Patient Name: Erika Downs Procedure Date: 11/15/2022 12:17 PM MRN: [id number] Account #: [id number] Date of Birth: Feb 24, 1961 Admit Type: Inpatient Age: 62 Room: [facility] ENDO ROOM 4 Gender: Female Note Status: Finalized Instrument Name: Michaelle Birks [id number] Procedure:             Upper GI endoscopy Indications:           Hematemesis, Melena, Abnormal CT of the GI tract Providers:             Benay Pike. Alice Reichert MD, MD Referring MD:          No Local Md, MD (Referring MD) Medicines:             Propofol per Anesthesia Complications:         No immediate complications. Estimated blood loss:                         Minimal. Procedure:             Pre-Anesthesia Assessment:                        - The risks and benefits of the procedure and the                         sedation options and risks were discussed with the                         patient. All questions were answered and informed                         consent was obtained.                        - Patient identification and proposed procedure were                         verified prior to the procedure by the nurse. The                         procedure was verified in the procedure room.                        - ASA Grade Assessment: III - A patient with severe                         systemic disease.                        - After reviewing the risks and benefits, the patient                         was deemed in satisfactory condition to undergo the                         procedure.                        After obtaining informed consent, the endoscope was  passed under direct vision. Throughout the procedure,                         the patient's blood pressure, pulse, and oxygen                         saturations were monitored continuously. The Endoscope                         was introduced through the mouth, and advanced to the                          third part of duodenum. The upper GI endoscopy was                         accomplished without difficulty. The patient tolerated                         the procedure well. Findings:      The esophagus was normal.      A 3 cm hiatal hernia was present.      One non-bleeding cratered gastric ulcer with adherent clot was found in       the gastric antrum. The lesion was thirty mm by thirty mm in largest       dimension. Heaped up edges of the ulcer were noted suspicious for       malignancy. Biopsy x 6 obtained from edges of ulcer. Hemostasis not       required. Estimated blood loss was minimal.      One non-bleeding cratered gastric ulcer with no stigmata of bleeding was       found in the prepyloric region of the stomach. The lesion was eighteen       mm by twenty mm in largest dimension.      The examined duodenum was normal.      Red blood was found in the gastric body.      The exam was otherwise without abnormality. Impression:            - Normal esophagus.                        - 3 cm hiatal hernia.                        - Non-bleeding gastric ulcer with adherent clot.                        - Non-bleeding gastric ulcer with no stigmata of                         bleeding.                        - Normal examined duodenum.                        - Red blood in the gastric body.                        - The examination was otherwise normal.                        -  No specimens collected. Recommendation:        - Return patient to hospital ward for ongoing care.                        - NPO today.                        - Serial H/H, Serial exams                        - Patient now agreeable to IV blood transfusion as                         medically necessary. I went over the informed consent                         with the patient.                        - GI service will continue to follow the patient's                         progress and closely observe  for any changes in                         clinical status.                        - Await pathology results.                        - The findings and recommendations were discussed with                         the patient and their family. Procedure Code(s):     --- Professional ---                        [phone], Esophagogastroduodenoscopy, flexible,                         transoral; diagnostic, including collection of                         specimen(s) by brushing or washing, when performed                         (separate procedure) Diagnosis Code(s):     --- Professional ---                        R93.3, Abnormal findings on diagnostic imaging of                         other parts of digestive tract                        K92.1, Melena (includes Hematochezia)                        K92.0, Hematemesis  K92.2, Gastrointestinal hemorrhage, unspecified                        K25.9, Gastric ulcer, unspecified as acute or chronic,                         without hemorrhage or perforation                        K25.4, Chronic or unspecified gastric ulcer with                         hemorrhage                        K44.9, Diaphragmatic hernia without obstruction or                         gangrene CPT copyright 2022 American Medical Association. All rights reserved. The codes documented in this report are preliminary and upon coder review may  be revised to meet current compliance requirements. Efrain Sella MD, MD 11/15/2022 1:40:57 PM This report has been signed electronically. Number of Addenda: 0 Note Initiated On: 11/15/2022 12:17 PM Estimated Blood Loss:  Estimated blood loss was minimal.      [facility]

## 2022-11-15 NOTE — OR Nursing (Signed)
Rn spoke to [facility]. REPORTS PT BEING TRANSFERRED OUT O ROOM 206. RN INFORMED DR Alice Reichert THAT PT BEING TRANSFERRED OUT TO FLOOR INSTEAD OF CCU. REPORTS THIS IS OKAY

## 2022-11-15 NOTE — Progress Notes (Signed)
Pt was taken for EGD. Pt is alert v/s are stable at this time.

## 2022-11-15 NOTE — Final Progress Note (Addendum)
Updated patient's daughter Baruch Gouty about her mother. Discussed endoscopy findings and also shared that if their is truly blood in the urine (her concern), it's not related to endoscopy today and will start with UA first. She isn't any blood thinner and no foley.  She also reports about 40 lbs unintentional wt loss - haven't seen any dr's as she has no insurance.  Recheck CBC showed Hb 7 - 1 PRBC transfusion ordered.  Time spent 12 mins

## 2022-11-15 NOTE — Progress Notes (Signed)
  Progress Note   Patient: Erika Downs [id number] DOB: [birth year] DOA: 11/14/2022     1 DOS: the patient was seen and examined on 11/15/2022   Brief hospital course:  62 y.o. female with medical history significant of hyperlipidemia who presents to the ED due to abdominal pain and bloody vomit  Admitted for acute blood loss anemia likely due to upper GI bleed  2/18: EGD, transfer to floor  Assessment and Plan: * Acute upper GI bleed Patient presenting with 3 days of vomiting and abdominal pain, with hematemesis developing last night in addition to melena.  CT with evidence of progressive gastric antral ulcer with mural thickening and adjacent fat stranding.  Lumen of the stomach with evidence of blood products concerning for active hemorrhage.  Unfortunately, patient is allergic to contrast, precluding the possibility of IR embolization.  - Transfer to medical floor after EGD - GI performing EGD this afternoon - Continue Protonix infusion and transition to Protonix 40 mg IV BID depending on EGD findings   Acute blood loss anemia In the setting of upper GI bleed.  Hemoglobin on admission 9.9, previously 14.3 approximately 3 months ago.  Patient is tachycardic and tachypneic, however blood pressure is remaining stable at this time.  High risk for decompensation.  Will transfuse 1 PRBC for hemoglobin less than 7     Latest Ref Rng & Units 11/15/2022    9:50 AM 11/15/2022    4:37 AM 11/14/2022   10:33 PM  CBC  WBC 4.0 - 10.5 K/uL 16.8  17.4  16.2   Hemoglobin 12.0 - 15.0 g/dL 7.6  8.0  8.1   Hematocrit 36.0 - 46.0 % 23.8  24.6  25.2   Platelets 150 - 400 K/uL 264  271  286      Unintentional weight loss Patient reports several month history of unintentional weight loss.  Per chart review, patient weighed 59 kg in April 2023 and currently weighs approximately 44 kg.  Given lack of age-appropriate cancer screening, this is concerning for an occult malignancy.   - Follow-up EGD biopsy  results if obtained - Dietitian consulted - Recommend outpatient establishment with PCP and potentially oncology depending on etiology   Tobacco use - Nicotine patch ordered, counseled        Subjective: Was feeling nauseous but has not had any vomiting or passing of blood, husband at bedside  Physical Exam: Vitals:   11/15/22 1000 11/15/22 1100 11/15/22 1200 11/15/22 1240  BP: 104/65 112/64    Pulse: 99 98 99 92  Resp: (!) 21 (!) 22 (!) 22 18  Temp:   98.2 F (36.8 C) 99.3 F (37.4 C)  TempSrc:    Oral  SpO2: 94% 93% 94% 99%  Weight:      Height:       62 year old female who is underweight looks toxic and critically ill Lungs clear to auscultation bilaterally, tachypneic Cardiovascular regular rate and rhythm, tachycardic Abdomen: Epigastric tenderness, no rigidity Skin: Pale  Neuro alert and awake, nonfocal Psych normal mood and affect Data Reviewed:  Hemoglobin 7.6  Family Communication: Husband at bedside updated  Disposition: Status is: Inpatient Remains inpatient appropriate because: GI bleed and acute blood loss anemia management  Planned Discharge Destination: Home   DVT prophylaxis-SCDs Time spent: 35 minutes  Author: Max Sane, MD 11/15/2022 1:22 PM  For on call review [email or web address].

## 2022-11-15 NOTE — H&P (View-Only) (Signed)
GI Inpatient Consult Note  Reason for Consult: UGIB, PUD   Attending Requesting Consult: Dr. Jose Persia, MD  History of Present Illness: Erika Downs is a 62 y.o. female seen for evaluation of UGIB/PUD at the request of admitting hospitalist - Dr. Jose Persia. Patient has a PMH of HLD and tobacco abuse. She presented to the [facility] ED yesterday afternoon for chief complaint of 3-day history of epigastric abdominal pain, nausea, vomiting, hematemesis, and tarry stools. Upon presentation to the ED, she was normotensive (126/73), tachycardic (108), tachypneic (30), and 99% O2 sats on room air.  Labs significant for leukocytosis (18.6K), hemoglobin 9.9, MCV 89.6, sodium 131, potassium 4.0, BUN 48, serum creatinine 0.65, albumin 2.8, INR 1.1, and negative respiratory panel. Hemoglobin was 14.3 in December. She had CT abd/pelvis without contrast which commented on progressive gastric antral ulcer with adjacent mural thickening throughout the distal antrum and pylorus with ddx including progressive PUD versus underlying neoplasm and malignant ulceration. No CT evidence for perforation. She was started on Protonix and admitted to step down. GI consulted for further evaluation and management.   Patient seen this morning resting comfortably in hospital bed. No acute overnight events. She is sitting in the room with her long-term boyfriend of 11+ years. She has not had any further episodes of vomiting or melanotic stools. She reports the vomiting started 2 days ago and was too many episodes to count. She reports emesis started out as bright red blood with clots and then turned into coffee-ground emesis. She also had several episodes of black, tarry stools. She reports no issues with hematemesis or melena prior to this episode. She reports the abdominal pain is the worst pain she has ever had. She describes it as burning, stabbing, and "I wish I had 20 babies instead of this pain." She reports pain is  mainly in epigastrium and does not radiate. She cannot tell me any alleviating or aggravating factors. She denies any complaints of esophageal dysphagia, odynophagia, early satiety, hoarseness, or indigestion/GERD. She reports over the past 10-11 months she has went from 165+ pounds to 97 pounds. Her appetite has been reduced over the past 3 months. Her pant size has gone from 10-12 to a size 3. She denies any previous endoscopic evaluation. She denies any known family history of GI malignancies. She does not take any daily medications. She denies any frequent NSAID use. No alcohol use. She does smoke 1 ppd. Interestingly, she had non-contrasted CT abd/pelvis performed in April and July 2023 which commented on potential gastric ulcer versus neoplasm. She never followed-up with GI after the ED.   Past Medical History:  Past Medical History:  Diagnosis Date   Chest pain    Hyperlipidemia     Problem List: Patient Active Problem List   Diagnosis Date Noted   Acute upper GI bleed 11/14/2022   Unintentional weight loss 11/14/2022   Hyperlipidemia 11/14/2022   Acute blood loss anemia 11/14/2022   Tobacco use 12/14/2016   Chest pain 09/25/2011    Past Surgical History: Past Surgical History:  Procedure Laterality Date   BLADDER SUSPENSION     NOVASURE ABLATION      Allergies: Allergies  Allergen Reactions   Ibuprofen Hives and Swelling   Ivp Dye [Iodinated Contrast Media] Hives    PT states she was injected at [facility] and had hives   Penicillins Hives and Swelling    Has patient had a PCN reaction causing immediate rash, facial/tongue/throat swelling, SOB or lightheadedness  with hypotension: Yes Has patient had a PCN reaction causing severe rash involving mucus membranes or skin necrosis: No Has patient had a PCN reaction that required hospitalization No Has patient had a PCN reaction occurring within the last 10 years: No If all of the above answers are "NO", then may proceed with  Cephalosporin use.     Home Medications: Medications Prior to Admission  Medication Sig Dispense Refill Last Dose   diazepam (VALIUM) 5 MG tablet Take 1 tablet (5 mg total) by mouth every 8 (eight) hours as needed for muscle spasms. (Patient not taking: Reported on 04/01/2018) 20 tablet 0    famotidine (PEPCID) 20 MG tablet Take 1 tablet (20 mg total) by mouth 2 (two) times daily. 60 tablet 0    ibuprofen (ADVIL,MOTRIN) 600 MG tablet Take 1 tablet (600 mg total) by mouth every 6 (six) hours as needed. (Patient not taking: Reported on 04/01/2018) 30 tablet 0    metoCLOPramide (REGLAN) 10 MG tablet Take 1 tablet (10 mg total) by mouth every 6 (six) hours as needed. 30 tablet 0    ondansetron (ZOFRAN-ODT) 4 MG disintegrating tablet Take 1 tablet (4 mg total) by mouth every 8 (eight) hours as needed for nausea or vomiting. 20 tablet 0    oseltamivir (TAMIFLU) 75 MG capsule Take 1 capsule (75 mg total) by mouth every 12 (twelve) hours. 10 capsule 0    sucralfate (CARAFATE) 1 g tablet Take 1 tablet (1 g total) by mouth 4 (four) times daily. 120 tablet 1    traMADol (ULTRAM) 50 MG tablet Take 1 tablet (50 mg total) by mouth every 6 (six) hours as needed. 15 tablet 0    Home medication reconciliation was completed with the patient.   Scheduled Inpatient Medications:    Chlorhexidine Gluconate Cloth  6 each Topical Daily   folic acid  1 mg Intravenous Daily   nicotine  14 mg Transdermal Daily   [START ON 11/18/2022] pantoprazole  40 mg Intravenous Q12H   sodium chloride flush  3 mL Intravenous Q12H   thiamine (VITAMIN B1) injection  100 mg Intravenous Daily    Continuous Inpatient Infusions:    lactated ringers 100 mL/hr at 11/15/22 0600   pantoprazole 8 mg/hr (11/15/22 0600)    PRN Inpatient Medications:  HYDROmorphone (DILAUDID) injection, ondansetron **OR** ondansetron (ZOFRAN) IV, mouth rinse  Family History: family history is not on file.  The patient's family history is negative for  inflammatory bowel disorders, GI malignancy, or solid organ transplantation.  Social History:   reports that she has been smoking cigarettes. She has been smoking an average of .5 packs per day. She has never used smokeless tobacco. She reports that she does not currently use alcohol. She reports that she does not use drugs. The patient denies ETOH, tobacco, or drug use.   Review of Systems: Constitutional: + weight loss Eyes: No changes in vision. ENT: No oral lesions, sore throat.  GI: see HPI.  Heme/Lymph: No easy bruising.  CV: No chest pain.  GU: No hematuria.  Integumentary: No rashes.  Neuro: No headaches.  Psych: No depression. + Anxiety.  Endocrine: No heat/cold intolerance.  Allergic/Immunologic: No urticaria.  Resp: No cough, SOB.  Musculoskeletal: No joint swelling.    Physical Examination: BP 105/67   Pulse 100   Temp 98.8 F (37.1 C)   Resp (!) 24   Ht 5' 2"$  (1.575 m)   Wt 44 kg   SpO2 93%   BMI 17.74 kg/m  Gen: NAD, alert and oriented x 4 HEENT: PEERLA, EOMI, Neck: supple, no JVD or thyromegaly Chest: CTA bilaterally, no wheezes, crackles, or other adventitious sounds CV: RRR, no m/g/c/r Abd: soft, nondistended, +BS in all four quadrants; tender to light and deep palpation in epigastrium and LLQ, no HSM, guarding, ridigity, or rebound tenderness Ext: no edema, well perfused with 2+ pulses, Skin: no rash or lesions noted Lymph: no LAD  Data: Lab Results  Component Value Date   WBC 17.4 (H) 11/15/2022   HGB 8.0 (L) 11/15/2022   HCT 24.6 (L) 11/15/2022   MCV 88.2 11/15/2022   PLT 271 11/15/2022   Recent Labs  Lab 11/14/22 1607 11/14/22 2233 11/15/22 0437  HGB 9.9* 8.1* 8.0*   Lab Results  Component Value Date   NA 135 11/15/2022   K 3.5 11/15/2022   CL 103 11/15/2022   CO2 27 11/15/2022   BUN 29 (H) 11/15/2022   CREATININE 0.50 11/15/2022   Lab Results  Component Value Date   ALT 11 11/14/2022   AST 33 11/14/2022   ALKPHOS 57  11/14/2022   BILITOT 0.5 11/14/2022   Recent Labs  Lab 11/14/22 1607  INR 1.1   CT Abd/Pelvis WO contrast 11/14/2022: IMPRESSION: 1. Progressive gastric antral ulcer, with adjacent mural thickening throughout the distal antrum and pylorus. Differential includes progressive peptic ulcer disease versus underlying mass and malignant ulceration. There is persistent adjacent fat stranding, without evidence of frank perforation or pneumoperitoneum. High attenuation material within the lumen of the stomach adjacent to the ulceration could reflect blood products and active hemorrhage. Correlation with endoscopy is recommended for further evaluation. 2. Nonspecific subcentimeter mesenteric lymph nodes surrounding the gastric antrum. 3. 3.1 cm infrarenal abdominal aortic aneurysm. Recommend follow-up every 3 years. Reference: J Am Coll Radiol [id number]. 4.  Aortic Atherosclerosis (ICD10-I70.0).  Assessment/Plan:  62 y/o Caucasian female with a PMH of HLD and tobacco abuse presented to the [facility] ED yesterday afternoon for 3-day history of progressive epigastric abdominal pain with associated nausea, vomiting, hematemesis, and melena. She was found to have acute blood loss anemia with drop in hemoglobin from baseline >14 to 8.0 with non-contrasted CT scan concerning for progressive gastric antral ulcer versus neoplasm and malignancy ulceration. GI consulted for further evaluation and management.   Acute blood loss anemia/UGIB - hemoglobin 8.0 this AM from baseline >14 concerning for ABLA. Differential diagnosis includes PUD, neoplasm, gastritis, AVMs, Mallory-Weiss tear, duodenitis, esophagitis, less likely LGI bleed. She is currently hemodynamically stable with no overt gastrointestinal bleeding.   Abnormal CT scan - progressive gastric antral ulcer versus neoplasm   Epigastric abdominal pain   Nausea/vomiting   Hematemesis/Melena  Unintentional weight loss - 65-lbs x <1  year  Tobacco abuse  DNR status   Recommendations:  - Maintain 2 large bore IVs for access - Continue to monitor serial H&H. Transfuse for Hgb <7.0.  - Continue Protonix gtt for gastric protection  - No signs of overt gastrointestinal bleeding. Currently hemodynamically stable.  - Continue supportive care with IV fluid hydration, pain control, antiemetics prn - Clinical presentation is highly concerning for underlying malignancy given her 65-lb unintentional weight loss and CT scan findings.  - Advise EGD for endoluminal evaluation for biopsies +/- endoscopic hemostasis. I discussed procedure details and indications with patient and boyfriend in room today and she consents to proceed.  - EGD this morning with Dr. Alice Reichert - Strictly NPO - See procedure note for findings and further recommendations  I reviewed the risks (including  bleeding, perforation, infection, anesthesia complications, cardiac/respiratory complications), benefits and alternatives of EGD. Patient consents to proceed.    Thank you for the consult. Please call with questions or concerns.  Geanie Kenning, PA-C [facility] [phone]

## 2022-11-15 NOTE — Transfer of Care (Signed)
Immediate Anesthesia Transfer of Care Note  Patient: Erika Downs  Procedure(s) Performed: ESOPHAGOGASTRODUODENOSCOPY (EGD) WITH PROPOFOL  Patient Location: PACU  Anesthesia Type:General  Level of Consciousness: awake  Airway & Oxygen Therapy: Patient connected to face mask oxygen  Post-op Assessment: Report given to RN  Post vital signs: stable  Last Vitals:  Vitals Value Taken Time  BP 91/50 11/15/22 1342  Temp    Pulse 97 11/15/22 1342  Resp 21 11/15/22 1342  SpO2 100 % 11/15/22 1342  Vitals shown include unvalidated device data.  Last Pain:  Vitals:   11/15/22 1240  TempSrc: Oral  PainSc: 10-Worst pain ever         Complications: No notable events documented.

## 2022-11-15 NOTE — Consult Note (Signed)
GI Inpatient Consult Note  Reason for Consult: UGIB, PUD   Attending Requesting Consult: Dr. Jose Persia, MD  History of Present Illness: Erika Downs is a 62 y.o. female seen for evaluation of UGIB/PUD at the request of admitting hospitalist - Dr. Jose Persia. Patient has a PMH of HLD and tobacco abuse. She presented to the [facility] ED yesterday afternoon for chief complaint of 3-day history of epigastric abdominal pain, nausea, vomiting, hematemesis, and tarry stools. Upon presentation to the ED, she was normotensive (126/73), tachycardic (108), tachypneic (30), and 99% O2 sats on room air.  Labs significant for leukocytosis (18.6K), hemoglobin 9.9, MCV 89.6, sodium 131, potassium 4.0, BUN 48, serum creatinine 0.65, albumin 2.8, INR 1.1, and negative respiratory panel. Hemoglobin was 14.3 in December. She had CT abd/pelvis without contrast which commented on progressive gastric antral ulcer with adjacent mural thickening throughout the distal antrum and pylorus with ddx including progressive PUD versus underlying neoplasm and malignant ulceration. No CT evidence for perforation. She was started on Protonix and admitted to step down. GI consulted for further evaluation and management.   Patient seen this morning resting comfortably in hospital bed. No acute overnight events. She is sitting in the room with her long-term boyfriend of 11+ years. She has not had any further episodes of vomiting or melanotic stools. She reports the vomiting started 2 days ago and was too many episodes to count. She reports emesis started out as bright red blood with clots and then turned into coffee-ground emesis. She also had several episodes of black, tarry stools. She reports no issues with hematemesis or melena prior to this episode. She reports the abdominal pain is the worst pain she has ever had. She describes it as burning, stabbing, and "I wish I had 20 babies instead of this pain." She reports pain is  mainly in epigastrium and does not radiate. She cannot tell me any alleviating or aggravating factors. She denies any complaints of esophageal dysphagia, odynophagia, early satiety, hoarseness, or indigestion/GERD. She reports over the past 10-11 months she has went from 165+ pounds to 97 pounds. Her appetite has been reduced over the past 3 months. Her pant size has gone from 10-12 to a size 3. She denies any previous endoscopic evaluation. She denies any known family history of GI malignancies. She does not take any daily medications. She denies any frequent NSAID use. No alcohol use. She does smoke 1 ppd. Interestingly, she had non-contrasted CT abd/pelvis performed in April and July 2023 which commented on potential gastric ulcer versus neoplasm. She never followed-up with GI after the ED.   Past Medical History:  Past Medical History:  Diagnosis Date   Chest pain    Hyperlipidemia     Problem List: Patient Active Problem List   Diagnosis Date Noted   Acute upper GI bleed 11/14/2022   Unintentional weight loss 11/14/2022   Hyperlipidemia 11/14/2022   Acute blood loss anemia 11/14/2022   Tobacco use 12/14/2016   Chest pain 09/25/2011    Past Surgical History: Past Surgical History:  Procedure Laterality Date   BLADDER SUSPENSION     NOVASURE ABLATION      Allergies: Allergies  Allergen Reactions   Ibuprofen Hives and Swelling   Ivp Dye [Iodinated Contrast Media] Hives    PT states she was injected at [facility] and had hives   Penicillins Hives and Swelling    Has patient had a PCN reaction causing immediate rash, facial/tongue/throat swelling, SOB or lightheadedness  with hypotension: Yes Has patient had a PCN reaction causing severe rash involving mucus membranes or skin necrosis: No Has patient had a PCN reaction that required hospitalization No Has patient had a PCN reaction occurring within the last 10 years: No If all of the above answers are "NO", then may proceed with  Cephalosporin use.     Home Medications: Medications Prior to Admission  Medication Sig Dispense Refill Last Dose   diazepam (VALIUM) 5 MG tablet Take 1 tablet (5 mg total) by mouth every 8 (eight) hours as needed for muscle spasms. (Patient not taking: Reported on 04/01/2018) 20 tablet 0    famotidine (PEPCID) 20 MG tablet Take 1 tablet (20 mg total) by mouth 2 (two) times daily. 60 tablet 0    ibuprofen (ADVIL,MOTRIN) 600 MG tablet Take 1 tablet (600 mg total) by mouth every 6 (six) hours as needed. (Patient not taking: Reported on 04/01/2018) 30 tablet 0    metoCLOPramide (REGLAN) 10 MG tablet Take 1 tablet (10 mg total) by mouth every 6 (six) hours as needed. 30 tablet 0    ondansetron (ZOFRAN-ODT) 4 MG disintegrating tablet Take 1 tablet (4 mg total) by mouth every 8 (eight) hours as needed for nausea or vomiting. 20 tablet 0    oseltamivir (TAMIFLU) 75 MG capsule Take 1 capsule (75 mg total) by mouth every 12 (twelve) hours. 10 capsule 0    sucralfate (CARAFATE) 1 g tablet Take 1 tablet (1 g total) by mouth 4 (four) times daily. 120 tablet 1    traMADol (ULTRAM) 50 MG tablet Take 1 tablet (50 mg total) by mouth every 6 (six) hours as needed. 15 tablet 0    Home medication reconciliation was completed with the patient.   Scheduled Inpatient Medications:    Chlorhexidine Gluconate Cloth  6 each Topical Daily   folic acid  1 mg Intravenous Daily   nicotine  14 mg Transdermal Daily   [START ON 11/18/2022] pantoprazole  40 mg Intravenous Q12H   sodium chloride flush  3 mL Intravenous Q12H   thiamine (VITAMIN B1) injection  100 mg Intravenous Daily    Continuous Inpatient Infusions:    lactated ringers 100 mL/hr at 11/15/22 0600   pantoprazole 8 mg/hr (11/15/22 0600)    PRN Inpatient Medications:  HYDROmorphone (DILAUDID) injection, ondansetron **OR** ondansetron (ZOFRAN) IV, mouth rinse  Family History: family history is not on file.  The patient's family history is negative for  inflammatory bowel disorders, GI malignancy, or solid organ transplantation.  Social History:   reports that she has been smoking cigarettes. She has been smoking an average of .5 packs per day. She has never used smokeless tobacco. She reports that she does not currently use alcohol. She reports that she does not use drugs. The patient denies ETOH, tobacco, or drug use.   Review of Systems: Constitutional: + weight loss Eyes: No changes in vision. ENT: No oral lesions, sore throat.  GI: see HPI.  Heme/Lymph: No easy bruising.  CV: No chest pain.  GU: No hematuria.  Integumentary: No rashes.  Neuro: No headaches.  Psych: No depression. + Anxiety.  Endocrine: No heat/cold intolerance.  Allergic/Immunologic: No urticaria.  Resp: No cough, SOB.  Musculoskeletal: No joint swelling.    Physical Examination: BP 105/67   Pulse 100   Temp 98.8 F (37.1 C)   Resp (!) 24   Ht 5' 2"$  (1.575 m)   Wt 44 kg   SpO2 93%   BMI 17.74 kg/m  Gen: NAD, alert and oriented x 4 HEENT: PEERLA, EOMI, Neck: supple, no JVD or thyromegaly Chest: CTA bilaterally, no wheezes, crackles, or other adventitious sounds CV: RRR, no m/g/c/r Abd: soft, nondistended, +BS in all four quadrants; tender to light and deep palpation in epigastrium and LLQ, no HSM, guarding, ridigity, or rebound tenderness Ext: no edema, well perfused with 2+ pulses, Skin: no rash or lesions noted Lymph: no LAD  Data: Lab Results  Component Value Date   WBC 17.4 (H) 11/15/2022   HGB 8.0 (L) 11/15/2022   HCT 24.6 (L) 11/15/2022   MCV 88.2 11/15/2022   PLT 271 11/15/2022   Recent Labs  Lab 11/14/22 1607 11/14/22 2233 11/15/22 0437  HGB 9.9* 8.1* 8.0*   Lab Results  Component Value Date   NA 135 11/15/2022   K 3.5 11/15/2022   CL 103 11/15/2022   CO2 27 11/15/2022   BUN 29 (H) 11/15/2022   CREATININE 0.50 11/15/2022   Lab Results  Component Value Date   ALT 11 11/14/2022   AST 33 11/14/2022   ALKPHOS 57  11/14/2022   BILITOT 0.5 11/14/2022   Recent Labs  Lab 11/14/22 1607  INR 1.1   CT Abd/Pelvis WO contrast 11/14/2022: IMPRESSION: 1. Progressive gastric antral ulcer, with adjacent mural thickening throughout the distal antrum and pylorus. Differential includes progressive peptic ulcer disease versus underlying mass and malignant ulceration. There is persistent adjacent fat stranding, without evidence of frank perforation or pneumoperitoneum. High attenuation material within the lumen of the stomach adjacent to the ulceration could reflect blood products and active hemorrhage. Correlation with endoscopy is recommended for further evaluation. 2. Nonspecific subcentimeter mesenteric lymph nodes surrounding the gastric antrum. 3. 3.1 cm infrarenal abdominal aortic aneurysm. Recommend follow-up every 3 years. Reference: J Am Coll Radiol [id number]. 4.  Aortic Atherosclerosis (ICD10-I70.0).  Assessment/Plan:  62 y/o Caucasian female with a PMH of HLD and tobacco abuse presented to the [facility] ED yesterday afternoon for 3-day history of progressive epigastric abdominal pain with associated nausea, vomiting, hematemesis, and melena. She was found to have acute blood loss anemia with drop in hemoglobin from baseline >14 to 8.0 with non-contrasted CT scan concerning for progressive gastric antral ulcer versus neoplasm and malignancy ulceration. GI consulted for further evaluation and management.   Acute blood loss anemia/UGIB - hemoglobin 8.0 this AM from baseline >14 concerning for ABLA. Differential diagnosis includes PUD, neoplasm, gastritis, AVMs, Mallory-Weiss tear, duodenitis, esophagitis, less likely LGI bleed. She is currently hemodynamically stable with no overt gastrointestinal bleeding.   Abnormal CT scan - progressive gastric antral ulcer versus neoplasm   Epigastric abdominal pain   Nausea/vomiting   Hematemesis/Melena  Unintentional weight loss - 65-lbs x <1  year  Tobacco abuse  DNR status   Recommendations:  - Maintain 2 large bore IVs for access - Continue to monitor serial H&H. Transfuse for Hgb <7.0.  - Continue Protonix gtt for gastric protection  - No signs of overt gastrointestinal bleeding. Currently hemodynamically stable.  - Continue supportive care with IV fluid hydration, pain control, antiemetics prn - Clinical presentation is highly concerning for underlying malignancy given her 65-lb unintentional weight loss and CT scan findings.  - Advise EGD for endoluminal evaluation for biopsies +/- endoscopic hemostasis. I discussed procedure details and indications with patient and boyfriend in room today and she consents to proceed.  - EGD this morning with Dr. Alice Reichert - Strictly NPO - See procedure note for findings and further recommendations  I reviewed the risks (including  bleeding, perforation, infection, anesthesia complications, cardiac/respiratory complications), benefits and alternatives of EGD. Patient consents to proceed.    Thank you for the consult. Please call with questions or concerns.  Geanie Kenning, PA-C [facility] [phone]

## 2022-11-15 NOTE — Progress Notes (Signed)
Report was called to [facility] rn from 2c.

## 2022-11-15 NOTE — Anesthesia Preprocedure Evaluation (Signed)
Anesthesia Evaluation  Patient identified by MRN, date of birth, ID band Patient awake    Reviewed: Allergy & Precautions, H&P , NPO status , Patient's Chart, lab work & pertinent test results, reviewed documented beta blocker date and time   History of Anesthesia Complications Negative for: history of anesthetic complications  Airway Mallampati: II  TM Distance: >3 FB Neck ROM: full  Mouth opening: Limited Mouth Opening  Dental  (+) Dental Advidsory Given, Edentulous Upper, Edentulous Lower   Pulmonary neg shortness of breath, neg COPD, neg recent URI, Current Smoker and Patient abstained from smoking.   Pulmonary exam normal breath sounds clear to auscultation       Cardiovascular Exercise Tolerance: Good negative cardio ROS Normal cardiovascular exam Rhythm:regular Rate:Normal     Neuro/Psych negative neurological ROS  negative psych ROS   GI/Hepatic Neg liver ROS,GERD  ,,  Endo/Other  negative endocrine ROS    Renal/GU negative Renal ROS  negative genitourinary   Musculoskeletal   Abdominal   Peds  Hematology  (+) Blood dyscrasia, anemia   Anesthesia Other Findings Past Medical History: No date: Chest pain No date: Hyperlipidemia   Reproductive/Obstetrics negative OB ROS                             Anesthesia Physical Anesthesia Plan  ASA: 3  Anesthesia Plan: General   Post-op Pain Management:    Induction: Intravenous  PONV Risk Score and Plan: 2 and Propofol infusion, TIVA and Treatment may vary due to age or medical condition  Airway Management Planned: Natural Airway and Nasal Cannula  Additional Equipment:   Intra-op Plan:   Post-operative Plan:   Informed Consent: I have reviewed the patients History and Physical, chart, labs and discussed the procedure including the risks, benefits and alternatives for the proposed anesthesia with the patient or authorized  representative who has indicated his/her understanding and acceptance.     Dental Advisory Given  Plan Discussed with: Anesthesiologist, CRNA and Surgeon  Anesthesia Plan Comments:        Anesthesia Quick Evaluation

## 2022-11-16 ENCOUNTER — Encounter: Payer: Self-pay | Admitting: Internal Medicine

## 2022-11-16 ENCOUNTER — Other Ambulatory Visit: Payer: Self-pay

## 2022-11-16 DIAGNOSIS — E43 Unspecified severe protein-calorie malnutrition: Secondary | ICD-10-CM | POA: Diagnosis present

## 2022-11-16 DIAGNOSIS — K922 Gastrointestinal hemorrhage, unspecified: Secondary | ICD-10-CM

## 2022-11-16 LAB — CBC
HCT: 23.2 % — ABNORMAL LOW (ref 36.0–46.0)
Hemoglobin: 7.4 g/dL — ABNORMAL LOW (ref 12.0–15.0)
MCH: 28.1 pg (ref 26.0–34.0)
MCHC: 31.9 g/dL (ref 30.0–36.0)
MCV: 88.2 fL (ref 80.0–100.0)
Platelets: 237 10*3/uL (ref 150–400)
RBC: 2.63 MIL/uL — ABNORMAL LOW (ref 3.87–5.11)
RDW: 14.5 % (ref 11.5–15.5)
WBC: 13.9 10*3/uL — ABNORMAL HIGH (ref 4.0–10.5)
nRBC: 0 % (ref 0.0–0.2)

## 2022-11-16 LAB — BPAM RBC
Blood Product Expiration Date: 202403152359
ISSUE DATE / TIME: 202402182056
Unit Type and Rh: 6200

## 2022-11-16 LAB — BASIC METABOLIC PANEL
Anion gap: 8 (ref 5–15)
BUN: 12 mg/dL (ref 6–20)
CO2: 26 mmol/L (ref 22–32)
Calcium: 7.7 mg/dL — ABNORMAL LOW (ref 8.9–10.3)
Chloride: 98 mmol/L (ref 98–111)
Creatinine, Ser: 0.37 mg/dL — ABNORMAL LOW (ref 0.44–1.00)
GFR, Estimated: >60 mL/min (ref >60)
Glucose, Bld: 94 mg/dL (ref 70–99)
Potassium: 3 mmol/L — ABNORMAL LOW (ref 3.5–5.1)
Sodium: 132 mmol/L — ABNORMAL LOW (ref 135–145)

## 2022-11-16 LAB — TYPE AND SCREEN
ABO/RH(D): A POS
Antibody Screen: NEGATIVE
Unit division: 0

## 2022-11-16 LAB — URINALYSIS, COMPLETE (UACMP) WITH MICROSCOPIC
Bilirubin Urine: NEGATIVE
Glucose, UA: NEGATIVE mg/dL
Ketones, ur: 5 mg/dL — AB
Leukocytes,Ua: NEGATIVE
Nitrite: NEGATIVE
Protein, ur: NEGATIVE mg/dL
Specific Gravity, Urine: 1.015 (ref 1.005–1.030)
pH: 6 (ref 5.0–8.0)

## 2022-11-16 LAB — GLUCOSE, CAPILLARY: Glucose-Capillary: 110 mg/dL — ABNORMAL HIGH (ref 70–99)

## 2022-11-16 LAB — MAGNESIUM: Magnesium: 1.6 mg/dL — ABNORMAL LOW (ref 1.7–2.4)

## 2022-11-16 MED ORDER — HYDROCODONE-ACETAMINOPHEN 5-325 MG PO TABS
1.0000 | ORAL_TABLET | Freq: Three times a day (TID) | ORAL | Status: DC | PRN
  Administered 2022-11-16: 1 via ORAL
  Filled 2022-11-16: qty 1

## 2022-11-16 MED ORDER — PANTOPRAZOLE SODIUM 40 MG PO TBEC
40.0000 mg | DELAYED_RELEASE_TABLET | Freq: Two times a day (BID) | ORAL | Status: DC
  Administered 2022-11-16 – 2022-11-17 (×2): 40 mg via ORAL
  Filled 2022-11-16 (×2): qty 1

## 2022-11-16 MED ORDER — MAGNESIUM SULFATE 2 GM/50ML IV SOLN
2.0000 g | Freq: Once | INTRAVENOUS | Status: AC
  Administered 2022-11-16: 2 g via INTRAVENOUS
  Filled 2022-11-16: qty 50

## 2022-11-16 MED ORDER — HYDROCODONE-ACETAMINOPHEN 5-325 MG PO TABS
1.0000 | ORAL_TABLET | Freq: Three times a day (TID) | ORAL | Status: DC | PRN
  Administered 2022-11-16 – 2022-11-17 (×3): 1 via ORAL
  Filled 2022-11-16 (×3): qty 1

## 2022-11-16 MED ORDER — POTASSIUM CHLORIDE CRYS ER 20 MEQ PO TBCR
40.0000 meq | EXTENDED_RELEASE_TABLET | Freq: Once | ORAL | Status: DC
  Filled 2022-11-16: qty 2

## 2022-11-16 MED ORDER — HYDROMORPHONE HCL 1 MG/ML IJ SOLN
0.5000 mg | INTRAMUSCULAR | Status: DC | PRN
  Administered 2022-11-16 – 2022-11-17 (×10): 0.5 mg via INTRAVENOUS
  Filled 2022-11-16 (×10): qty 0.5

## 2022-11-16 MED ORDER — ADULT MULTIVITAMIN W/MINERALS CH
1.0000 | ORAL_TABLET | Freq: Every day | ORAL | Status: DC
  Administered 2022-11-17 – 2022-11-24 (×8): 1 via ORAL
  Filled 2022-11-16 (×8): qty 1

## 2022-11-16 MED ORDER — ENSURE ENLIVE PO LIQD
237.0000 mL | Freq: Three times a day (TID) | ORAL | Status: DC
  Administered 2022-11-18 – 2022-11-24 (×12): 237 mL via ORAL

## 2022-11-16 NOTE — Anesthesia Postprocedure Evaluation (Signed)
Anesthesia Post Note  Patient: Erika Downs  Procedure(s) Performed: ESOPHAGOGASTRODUODENOSCOPY (EGD) WITH PROPOFOL  Patient location during evaluation: Endoscopy Anesthesia Type: General Level of consciousness: awake and alert Pain management: pain level controlled Vital Signs Assessment: post-procedure vital signs reviewed and stable Respiratory status: spontaneous breathing, nonlabored ventilation, respiratory function stable and patient connected to nasal cannula oxygen Cardiovascular status: blood pressure returned to baseline and stable Postop Assessment: no apparent nausea or vomiting Anesthetic complications: no   No notable events documented.   Last Vitals:  Vitals:   11/15/22 2113 11/15/22 2339  BP: 112/71 108/60  Pulse: (!) 104 93  Resp: 20 18  Temp: 37.4 C 37 C  SpO2: 93% 95%    Last Pain:  Vitals:   11/16/22 0051  TempSrc:   PainSc: Asleep                 Martha Clan

## 2022-11-16 NOTE — Assessment & Plan Note (Signed)
Patient reports several month history of unintentional weight loss.  Per chart review, patient weighed 59 kg in April 2023 and currently weighs approximately 44 kg.  Given lack of age-appropriate cancer screening, this is concerning for an occult malignancy.   -Talked with daughter yesterday over phone.  She reports about 40 pound unintentional weight loss over the last 57-month-EGD on 2/18 showed large stomach ulcer in the antrum (at least 3 x 3 cm) with heaped up edges somewhat concerning but not diagnostic of cancer. Biopsies taken from edges of ulcer. Large clots on ulcer base not removed after attempt to lavage clear, no active bleeding. Another large ulcer (2cm) near bottom of stomach near pyloric channel also noted but no evidence of bleeding   - Follow-up EGD biopsy results - Oncology consulted, Dr. RJanese Banksaware.  I have ordered CEA

## 2022-11-16 NOTE — Assessment & Plan Note (Signed)
Patient presenting with 3 days of vomiting and abdominal pain, with hematemesis developing last night in addition to melena.  CT with evidence of progressive gastric antral ulcer with mural thickening and adjacent fat stranding.  Lumen of the stomach with evidence of blood products concerning for active hemorrhage.    -EGD on 2/18 showed large stomach ulcer in the antrum (at least 3 x 3 cm) with heaped up edges somewhat concerning but not diagnostic of cancer. Biopsies taken from edges of ulcer. Large clots on ulcer base not removed after attempt to lavage clear, no active bleeding. Another large ulcer (2cm) near bottom of stomach near pyloric channel also noted but no evidence of bleeding  - Continue Protonix infusion and transition to Protonix 40 mg po BID if she tolerates oral -Follow-up on biopsy from EGD

## 2022-11-16 NOTE — Progress Notes (Signed)
Initial Nutrition Assessment  DOCUMENTATION CODES:   Severe malnutrition in context of chronic illness  INTERVENTION:   Ensure Enlive po TID, each supplement provides 350 kcal and 20 grams of protein.  Magic cup TID with meals, each supplement provides 290 kcal and 9 grams of protein  MVI po daily   Pt at high refeed risk; recommend monitor potassium, magnesium and phosphorus labs daily until stable  Daily weights   NUTRITION DIAGNOSIS:   Severe Malnutrition related to chronic illness as evidenced by moderate fat depletion, moderate muscle depletion, 25 percent weight loss in 1 year.  GOAL:   Patient will meet greater than or equal to 90% of their needs  MONITOR:   PO intake, Supplement acceptance, Labs, Weight trends, I & O's, Skin  REASON FOR ASSESSMENT:   Consult Assessment of nutrition requirement/status  ASSESSMENT:   62 y/o female with h/o bladder prolapse s/p sling surgery, hiatal hernia and HLD who is admitted with GIB.  Pt s/p EGD on 2/18 which showed hiatal hernia and two gastric ulcers concerning for malignancy.   Met with pt in room today. Pt reports that she is not feeling well today; pt is not much in the mood to talk. Pt reports that she has been unable to keep food down for the past several months r/t vomiting. Pt reports that as soon as food hits her stomach that she will throw it back up. Pt reports significant weight loss pta. Per chart, pt is down 33lbs(25%) over the past year; this is significant weight loss. Pt reports that her UBW is around 160lbs which pt does appear to have weighed several years ago. Pt reports that she has been drinking strawberry Boost at home but reports that she has been unable to keep the Boost down lately. Pt reports that she does not have much of an appetite today. RD will add supplements and MVI to help pt meet her estimated needs. Pt is at high refeed risk.   Medications reviewed and include: folic acid, protonix, Kcl,  thiamine, LRS @100ml$ /hr, Mg sulfate  Labs reviewed: Na 132(L), K 3.0(L), creat 0.37(L), Mg 1.6(L) Wbc- 13.9(H), Hgb 7.4(L), Hct 23.2(L)  NUTRITION - FOCUSED PHYSICAL EXAM:  Flowsheet Row Most Recent Value  Orbital Region Mild depletion  Upper Arm Region Moderate depletion  Thoracic and Lumbar Region Moderate depletion  Buccal Region Mild depletion  Temple Region Mild depletion  Clavicle Bone Region Moderate depletion  Clavicle and Acromion Bone Region Moderate depletion  Scapular Bone Region Mild depletion  Dorsal Hand Moderate depletion  Patellar Region Moderate depletion  Anterior Thigh Region Moderate depletion  Posterior Calf Region Moderate depletion  Edema (RD Assessment) None  Hair Reviewed  Eyes Reviewed  Mouth Reviewed  Skin Reviewed  Nails Reviewed   Diet Order:   Diet Order             Diet regular Room service appropriate? Yes; Fluid consistency: Thin  Diet effective now                  EDUCATION NEEDS:   Education needs have been addressed  Skin:  Skin Assessment: Reviewed RN Assessment  Last BM:  2/18  Height:   Ht Readings from Last 1 Encounters:  11/14/22 5' 2"$  (1.575 m)    Weight:   Wt Readings from Last 1 Encounters:  11/16/22 54.4 kg    Ideal Body Weight:  50 kg  BMI:  Body mass index is 21.94 kg/m.  Estimated Nutritional Needs:  Kcal:  1600-1800kcal/day  Protein:  80-90g/day  Fluid:  1.6-1.8L/day  Koleen Distance MS, RD, LDN Please refer to [facility] for RD and/or RD on-call/weekend/after hours pager

## 2022-11-16 NOTE — Consult Note (Addendum)
Hematology/Oncology Consult note [facility] [phone] [phone]  Patient Care Team: Pcp, No as PCP - General   Name of the patient: Erika Downs  [id number]  07-03-1961    Reason for consult: Concern for gastric cancer   Requesting physician: Dr. Manuella Ghazi  Date of visit: 11/16/2022    History of presenting illness-patient is a 62 year old female with a past medical history significant for hypertension and hyperlipidemia who presented to the ER with symptoms of nausea vomiting and abdominal pain.  CT scan showed progressive gastric antral ulcer with adjacent mural thickening throughout the distal antrum and pylorus.  Differential includes progressive peptic ulcer disease versus underlying mass and malignant ulceration.  Nonspecific subcentimeter mesenteric lymph node surrounding the gastric antrum.Patient had upper endoscopy on 11/05/2022 which showed nonbleeding cratered gastric ulcer with adherent clot.  33 mm x 30 mm in largest dimension.  Heaped up edges of the ulcer were noted suspicious for malignancy.  Biopsies were taken and are currently pending.    Pain scale- 7   Review of systems- Review of Systems  Constitutional:  Positive for malaise/fatigue. Negative for chills, fever and weight loss.  HENT:  Negative for congestion, ear discharge and nosebleeds.   Eyes:  Negative for blurred vision.  Respiratory:  Negative for cough, hemoptysis, sputum production, shortness of breath and wheezing.   Cardiovascular:  Negative for chest pain, palpitations, orthopnea and claudication.  Gastrointestinal:  Positive for abdominal pain, nausea and vomiting. Negative for blood in stool, constipation, diarrhea, heartburn and melena.  Genitourinary:  Negative for dysuria, flank pain, frequency, hematuria and urgency.  Musculoskeletal:  Negative for back pain, joint pain and myalgias.  Skin:  Negative for rash.  Neurological:  Negative for dizziness,  tingling, focal weakness, seizures, weakness and headaches.  Endo/Heme/Allergies:  Does not bruise/bleed easily.  Psychiatric/Behavioral:  Negative for depression and suicidal ideas. The patient does not have insomnia.     Allergies  Allergen Reactions   Ibuprofen Hives and Swelling   Ivp Dye [Iodinated Contrast Media] Hives    PT states she was injected at [facility] and had hives   Penicillins Hives and Swelling    Has patient had a PCN reaction causing immediate rash, facial/tongue/throat swelling, SOB or lightheadedness with hypotension: Yes Has patient had a PCN reaction causing severe rash involving mucus membranes or skin necrosis: No Has patient had a PCN reaction that required hospitalization No Has patient had a PCN reaction occurring within the last 10 years: No If all of the above answers are "NO", then may proceed with Cephalosporin use.     Patient Active Problem List   Diagnosis Date Noted   Acute upper GI bleed 11/14/2022   Unintentional weight loss 11/14/2022   Hyperlipidemia 11/14/2022   Acute blood loss anemia 11/14/2022   Tobacco use 12/14/2016   Chest pain 09/25/2011     Past Medical History:  Diagnosis Date   Chest pain    Hyperlipidemia      Past Surgical History:  Procedure Laterality Date   BLADDER SUSPENSION     ESOPHAGOGASTRODUODENOSCOPY (EGD) WITH PROPOFOL N/A 11/15/2022   Procedure: ESOPHAGOGASTRODUODENOSCOPY (EGD) WITH PROPOFOL;  Surgeon: Toledo, Benay Pike, MD;  Location: [facility] ENDOSCOPY;  Service: Gastroenterology;  Laterality: N/A;   NOVASURE ABLATION      Social History   Socioeconomic History   Marital status: Single    Spouse name: Not on file   Number of children: Not on file   Years of education: Not on  file   Highest education level: Not on file  Occupational History   Not on file  Tobacco Use   Smoking status: Every Day    Packs/day: 0.50    Types: Cigarettes   Smokeless tobacco: Never  Vaping Use   Vaping Use: Never used   Substance and Sexual Activity   Alcohol use: Not Currently   Drug use: No   Sexual activity: Not on file  Other Topics Concern   Not on file  Social History Narrative   Not on file   Social Determinants of Health   Financial Resource Strain: Not on file  Food Insecurity: Not on file  Transportation Needs: Not on file  Physical Activity: Not on file  Stress: Not on file  Social Connections: Not on file  Intimate Partner Violence: Not on file     History reviewed. No pertinent family history.   Current Facility-Administered Medications:    acetaminophen (TYLENOL) tablet 650 mg, 650 mg, Oral, Q6H PRN, Max Sane, MD, 650 mg at [id number]   folic acid injection 1 mg, 1 mg, Intravenous, Daily, Jose Persia, MD, 1 mg at 11/16/22 1232   HYDROcodone-acetaminophen (NORCO/VICODIN) 5-325 MG per tablet 1 tablet, 1 tablet, Oral, Q8H PRN, Max Sane, MD   HYDROmorphone (DILAUDID) injection 0.5 mg, 0.5 mg, Intravenous, Q2H PRN, Max Sane, MD, 0.5 mg at 11/16/22 1432   [COMPLETED] lactated ringers bolus 1,000 mL, 1,000 mL, Intravenous, Once, Last Rate: 999 mL/hr at 11/14/22 1954, 1,000 mL at 11/14/22 1954 **FOLLOWED BY** lactated ringers infusion, , Intravenous, Continuous, Jose Persia, MD, Last Rate: 100 mL/hr at 11/16/22 1331, New Bag at 11/16/22 1331   magnesium sulfate IVPB 2 g 50 mL, 2 g, Intravenous, Once, Max Sane, MD, Last Rate: 50 mL/hr at 11/16/22 1536, 2 g at 11/16/22 1536   nicotine (NICODERM CQ - dosed in mg/24 hours) patch 14 mg, 14 mg, Transdermal, Daily, Jose Persia, MD, 14 mg at 11/16/22 0840   ondansetron (ZOFRAN) tablet 4 mg, 4 mg, Oral, Q6H PRN **OR** ondansetron (ZOFRAN) injection 4 mg, 4 mg, Intravenous, Q6H PRN, Jose Persia, MD   Oral care mouth rinse, 15 mL, Mouth Rinse, PRN, [place], Bing Neighbors, NP   pantoprazole (PROTONIX) EC tablet 40 mg, 40 mg, Oral, BID AC, Shah, Vipul, MD   potassium chloride SA (KLOR-CON M) CR tablet 40 mEq, 40 mEq,  Oral, Once, Manuella Ghazi, Vipul, MD   sodium chloride flush (NS) 0.9 % injection 3 mL, 3 mL, Intravenous, Q12H, Jose Persia, MD, 3 mL at 11/16/22 1233   thiamine (VITAMIN B1) injection 100 mg, 100 mg, Intravenous, Daily, Jose Persia, MD, 100 mg at 11/16/22 1232   Physical exam:  Vitals:   11/15/22 2339 11/16/22 0335 11/16/22 0726 11/16/22 1555  BP: 108/60 120/69 125/66 124/67  Pulse: 93 96 93 92  Resp: 18 18 16 16  $ Temp: 98.6 F (37 C) 98.6 F (37 C) 99 F (37.2 C) 98.8 F (37.1 C)  TempSrc: Oral Oral Oral Oral  SpO2: 95% 96% 96% 96%  Weight:      Height:       Physical Exam Constitutional:      General: She is in acute distress.  Cardiovascular:     Rate and Rhythm: Normal rate and regular rhythm.     Heart sounds: Normal heart sounds.  Pulmonary:     Effort: Pulmonary effort is normal.     Breath sounds: Normal breath sounds.  Abdominal:     Comments: TTP in the epigastrium  Lymphadenopathy:     Comments: No palpable supraclavicular adenopathy  Skin:    General: Skin is warm and dry.  Neurological:     Mental Status: She is alert and oriented to person, place, and time.           Latest Ref Rng & Units 11/16/2022    3:40 AM  CMP  Glucose 70 - 99 mg/dL 94   BUN 6 - 20 mg/dL 12   Creatinine 0.44 - 1.00 mg/dL 0.37   Sodium 135 - 145 mmol/L 132   Potassium 3.5 - 5.1 mmol/L 3.0   Chloride 98 - 111 mmol/L 98   CO2 22 - 32 mmol/L 26   Calcium 8.9 - 10.3 mg/dL 7.7       Latest Ref Rng & Units 11/16/2022    3:40 AM  CBC  WBC 4.0 - 10.5 K/uL 13.9   Hemoglobin 12.0 - 15.0 g/dL 7.4   Hematocrit 36.0 - 46.0 % 23.2   Platelets 150 - 400 K/uL 237     @IMAGES$ @  CT ABDOMEN PELVIS WO CONTRAST  Result Date: 11/14/2022 CLINICAL DATA:  Gastrointestinal bleeding, abdominal pain for 1 week, hematemesis, dark stools EXAM: CT ABDOMEN AND PELVIS WITHOUT CONTRAST TECHNIQUE: Multidetector CT imaging of the abdomen and pelvis was performed following the standard protocol  without IV contrast. Unenhanced CT was performed per clinician order. Lack of IV contrast limits sensitivity and specificity, especially for evaluation of abdominal/pelvic solid viscera. Specifically, gastrointestinal bleeding cannot be evaluated without IV contrast. RADIATION DOSE REDUCTION: This exam was performed according to the departmental dose-optimization program which includes automated exposure control, adjustment of the mA and/or kV according to patient size and/or use of iterative reconstruction technique. COMPARISON:  04/06/2022 FINDINGS: Lower chest: No acute pleural or parenchymal lung disease. Hepatobiliary: Unremarkable unenhanced appearance of the liver and gallbladder. Pancreas: Unremarkable unenhanced appearance. Spleen: Unremarkable unenhanced appearance. Adrenals/Urinary Tract: No urinary tract calculi or obstructive uropathy within either kidney. The adrenals and bladder are unremarkable. Stomach/Bowel: There is persistent abnormality of the gastric antrum and pylorus as seen on prior study. There is abnormal wall thickening throughout the distal antrum and pylorus, with evidence of focal ulceration along the greater curvature of the stomach in the region the gastric antrum, best seen on image 32 of coronal series 5. There is adjacent stranding within the mesenteric fat. High attenuation in this region and within the gastric lumen is nonspecific, but could reflect blood products given history of hematemesis. Correlation with endoscopy is recommended if not previously performed. No bowel obstruction or ileus.  Normal retrocecal appendix. Vascular/Lymphatic: Diffuse atherosclerosis of the abdominal aorta is again noted. 3.1 cm infrarenal abdominal aortic aneurysm is again seen, unchanged. Evaluation of the vascular lumen is limited without IV contrast. No pathologic adenopathy within the abdomen or pelvis. Subcentimeter lymph nodes surrounding the distal stomach are nonspecific, largest  measuring 8 mm in short axis reference image 27/2. Reproductive: Uterus and bilateral adnexa are unremarkable. Other: There is no free fluid or free intraperitoneal gas. No abdominal wall hernia. Musculoskeletal: No acute or destructive bony lesions. Reconstructed images demonstrate no additional findings. IMPRESSION: 1. Progressive gastric antral ulcer, with adjacent mural thickening throughout the distal antrum and pylorus. Differential includes progressive peptic ulcer disease versus underlying mass and malignant ulceration. There is persistent adjacent fat stranding, without evidence of frank perforation or pneumoperitoneum. High attenuation material within the lumen of the stomach adjacent to the ulceration could reflect blood products and active hemorrhage. Correlation with endoscopy is  recommended for further evaluation. 2. Nonspecific subcentimeter mesenteric lymph nodes surrounding the gastric antrum. 3. 3.1 cm infrarenal abdominal aortic aneurysm. Recommend follow-up every 3 years. Reference: J Am Coll Radiol E031985. 4.  Aortic Atherosclerosis (ICD10-I70.0). These results were called by telephone at the time of interpretation on 11/14/2022 at 5:03 pm to provider [facility] , who verbally acknowledged these results. Electronically Signed   By: Randa Ngo M.D.   On: 11/14/2022 17:09   DG Chest Portable 1 View  Result Date: 11/14/2022 CLINICAL DATA:  Abdominal pain, hematemesis EXAM: PORTABLE CHEST 1 VIEW COMPARISON:  12/28/2021 FINDINGS: Single frontal view of the chest demonstrates an unremarkable cardiac silhouette. No airspace disease, effusion, or pneumothorax. No acute bony abnormality. Chronic posttraumatic changes distal right clavicle. IMPRESSION: 1. No acute intrathoracic process. Electronically Signed   By: Randa Ngo M.D.   On: 11/14/2022 16:42    Assessment and plan- Patient is a 62 y.o. female admitted for nausea vomiting and abdominal pain found to have gastric ulcer  potentially concerning for malignancy  At this time it is unclear if her gastric ulcer has a component of malignancy secondary to it.  She denies any frequent NSAID use.  I will currently await biopsy results.  If biopsies show malignancy she will need PET CT scan as an outpatient.  I have ordered CEA as well.  Anemia- possibly secondary to bleeding from gastric ulcer. Patient has received blood and therefore iron studies will be hard to interpret. Will order nevertheless   Thank you for this kind referral and the opportunity to participate in the care of this patient   Visit Diagnosis 1. UGIB (upper gastrointestinal bleed)     Dr. Randa Evens, MD, MPH [facility] at [facility] [id number] 11/16/2022

## 2022-11-16 NOTE — Assessment & Plan Note (Signed)
In the setting of upper GI bleed. Status post 1 PRBC transfusion on 2/18 for hemoglobin of 7     Latest Ref Rng & Units 11/16/2022    3:40 AM 11/15/2022    4:33 PM 11/15/2022    9:50 AM  CBC  WBC 4.0 - 10.5 K/uL 13.9  15.1  16.8   Hemoglobin 12.0 - 15.0 g/dL 7.4  7.0  7.6   Hematocrit 36.0 - 46.0 % 23.2  22.0  23.8   Platelets 150 - 400 K/uL 237  239  264

## 2022-11-16 NOTE — Assessment & Plan Note (Signed)
-   Nicotine patch ordered, counseled

## 2022-11-16 NOTE — Progress Notes (Signed)
Progress Note   Patient: Erika Downs [id number] DOB: 1961/02/25 DOA: 11/14/2022     2 DOS: the patient was seen and examined on 11/16/2022   Brief hospital course:  62 y.o. female with medical history significant of hyperlipidemia who presents to the ED due to abdominal pain and bloody vomit  Admitted for acute blood loss anemia likely due to upper GI bleed  2/18: EGD, transfer to floor 2/19: EGD showing 2 large ulcer (nonbleeding) and heaped up edges at the ulcer concerning for malignancy.  Pending biopsy, oncology consult  Assessment and Plan: * Acute upper GI bleed Patient presenting with 3 days of vomiting and abdominal pain, with hematemesis developing last night in addition to melena.  CT with evidence of progressive gastric antral ulcer with mural thickening and adjacent fat stranding.  Lumen of the stomach with evidence of blood products concerning for active hemorrhage.    -EGD on 2/18 showed large stomach ulcer in the antrum (at least 3 x 3 cm) with heaped up edges somewhat concerning but not diagnostic of cancer. Biopsies taken from edges of ulcer. Large clots on ulcer base not removed after attempt to lavage clear, no active bleeding. Another large ulcer (2cm) near bottom of stomach near pyloric channel also noted but no evidence of bleeding  - Continue Protonix infusion and transition to Protonix 40 mg po BID if she tolerates oral -Follow-up on biopsy from EGD   Acute blood loss anemia In the setting of upper GI bleed. Status post 1 PRBC transfusion on 2/18 for hemoglobin of 7     Latest Ref Rng & Units 11/16/2022    3:40 AM 11/15/2022    4:33 PM 11/15/2022    9:50 AM  CBC  WBC 4.0 - 10.5 K/uL 13.9  15.1  16.8   Hemoglobin 12.0 - 15.0 g/dL 7.4  7.0  7.6   Hematocrit 36.0 - 46.0 % 23.2  22.0  23.8   Platelets 150 - 400 K/uL 237  239  264      Unintentional weight loss Patient reports several month history of unintentional weight loss.  Per chart review, patient  weighed 59 kg in April 2023 and currently weighs approximately 44 kg.  Given lack of age-appropriate cancer screening, this is concerning for an occult malignancy.   -Talked with daughter yesterday over phone.  She reports about 40 pound unintentional weight loss over the last 41-month-EGD on 2/18 showed large stomach ulcer in the antrum (at least 3 x 3 cm) with heaped up edges somewhat concerning but not diagnostic of cancer. Biopsies taken from edges of ulcer. Large clots on ulcer base not removed after attempt to lavage clear, no active bleeding. Another large ulcer (2cm) near bottom of stomach near pyloric channel also noted but no evidence of bleeding   - Follow-up EGD biopsy results - Oncology consulted, Dr. RJanese Banksaware.  I have ordered CEA   Tobacco use - Nicotine patch ordered, counseled        Subjective: Complains of abdominal pain and requesting pain medication.  She does not have much appetite.  Physical Exam: Vitals:   11/15/22 2113 11/15/22 2339 11/16/22 0335 11/16/22 0726  BP: 112/71 108/60 120/69 125/66  Pulse: (!) 104 93 96 93  Resp: 20 18 18 16  $ Temp: 99.4 F (37.4 C) 98.6 F (37 C) 98.6 F (37 C) 99 F (37.2 C)  TempSrc: Oral Oral Oral Oral  SpO2: 93% 95% 96% 96%  Weight:  Height:       61 year old female who is underweight looks toxic and critically ill Lungs clear to auscultation bilaterally, tachypneic Cardiovascular regular rate and rhythm, tachycardic Abdomen: Epigastric and left upper quadrant tenderness, no rigidity Skin: Pale  Neuro alert and awake, nonfocal Psych normal mood and affect Data Reviewed:  EGD showing 2 large nonbleeding ulcer  Family Communication: Sister updated over phone  Disposition: Status is: Inpatient Remains inpatient appropriate because: Management of gastric ulcer and potential malignancy.  May need blood transfusion if hemoglobin drops less than 7.  Planned Discharge Destination: Home with Home Health   DVT  prophylaxis-SCDs Time spent: 35 minutes  Author: Max Sane, MD 11/16/2022 2:27 PM  For on call review [email or web address].

## 2022-11-16 NOTE — Progress Notes (Signed)
[facility] Inpatient Progress Note    Subjective: Patient seen for f/u large gastric ulcers in antrum and pre-pyloric stomach. No further bleeding. Biopsies pending. Patient in pain but otherwise no complaints.  Objective: Vital signs in last 24 hours: Temp:  [98.6 F (37 C)-100.3 F (37.9 C)] 98.8 F (37.1 C) (02/19 1555) Pulse Rate:  [92-107] 92 (02/19 1555) Resp:  [16-20] 16 (02/19 1555) BP: (108-125)/(60-71) 124/67 (02/19 1555) SpO2:  [93 %-96 %] 96 % (02/19 1555) Weight:  [54.4 kg] 54.4 kg (02/19 1629) Blood pressure 124/67, pulse 92, temperature 98.8 F (37.1 C), temperature source Oral, resp. rate 16, height 5' 2"$  (1.575 m), weight 54.4 kg, SpO2 96 %.    Intake/Output from previous day: 02/18 0701 - 02/19 0700 In: 1778.5 [I.V.:1485.5; Blood:293] Out: 750 [Urine:750]  Intake/Output this shift: Total I/O In: 120 [P.O.:120] Out: 350 [Urine:350]   Gen: NAD. Appears comfortable.  HEENT: [place]/AT. PERRLA. Normal external ear exam.  Chest: CTA, no wheezes.  CV: RR nl S1, S2. No gallops.  Abd: soft, diffusely tender with minimal guarding, no rebound. BS+  Ext: no edema. Pulses 2+  Neuro: Alert and oriented. Judgement appears normal. Nonfocal.   Lab Results: Results for orders placed or performed during the hospital encounter of 11/14/22 (from the past 24 hour(s))  Urinalysis, Complete w Microscopic -Urine, Clean Catch     Status: Abnormal   Collection Time: 11/16/22  3:40 AM  Result Value Ref Range   Color, Urine YELLOW (A) YELLOW   APPearance HAZY (A) CLEAR   Specific Gravity, Urine 1.015 1.005 - 1.030   pH 6.0 5.0 - 8.0   Glucose, UA NEGATIVE NEGATIVE mg/dL   Hgb urine dipstick LARGE (A) NEGATIVE   Bilirubin Urine NEGATIVE NEGATIVE   Ketones, ur 5 (A) NEGATIVE mg/dL   Protein, ur NEGATIVE NEGATIVE mg/dL   Nitrite NEGATIVE NEGATIVE   Leukocytes,Ua NEGATIVE NEGATIVE   RBC / HPF 0-5 0 - 5 RBC/hpf   WBC, UA 0-5 0 - 5 WBC/hpf   Bacteria,  UA RARE (A) NONE SEEN   Squamous Epithelial / HPF 0-5 0 - 5 /HPF  CBC     Status: Abnormal   Collection Time: 11/16/22  3:40 AM  Result Value Ref Range   WBC 13.9 (H) 4.0 - 10.5 K/uL   RBC 2.63 (L) 3.87 - 5.11 MIL/uL   Hemoglobin 7.4 (L) 12.0 - 15.0 g/dL   HCT 23.2 (L) 36.0 - 46.0 %   MCV 88.2 80.0 - 100.0 fL   MCH 28.1 26.0 - 34.0 pg   MCHC 31.9 30.0 - 36.0 g/dL   RDW 14.5 11.5 - 15.5 %   Platelets 237 150 - 400 K/uL   nRBC 0.0 0.0 - 0.2 %  Basic metabolic panel     Status: Abnormal   Collection Time: 11/16/22  3:40 AM  Result Value Ref Range   Sodium 132 (L) 135 - 145 mmol/L   Potassium 3.0 (L) 3.5 - 5.1 mmol/L   Chloride 98 98 - 111 mmol/L   CO2 26 22 - 32 mmol/L   Glucose, Bld 94 70 - 99 mg/dL   BUN 12 6 - 20 mg/dL   Creatinine, Ser 0.37 (L) 0.44 - 1.00 mg/dL   Calcium 7.7 (L) 8.9 - 10.3 mg/dL   GFR, Estimated >60 >60 mL/min   Anion gap 8 5 - 15  Magnesium     Status: Abnormal   Collection Time: 11/16/22  3:40 AM  Result Value Ref Range  Magnesium 1.6 (L) 1.7 - 2.4 mg/dL     Recent Labs    11/15/22 0950 11/15/22 1633 11/16/22 0340  WBC 16.8* 15.1* 13.9*  HGB 7.6* 7.0* 7.4*  HCT 23.8* 22.0* 23.2*  PLT 264 239 237   BMET Recent Labs    11/14/22 1607 11/15/22 0437 11/16/22 0340  NA 131* 135 132*  K 4.0 3.5 3.0*  CL 98 103 98  CO2 23 27 26  $ GLUCOSE 124* 120* 94  BUN 48* 29* 12  CREATININE 0.65 0.50 0.37*  CALCIUM 8.3* 7.7* 7.7*   LFT Recent Labs    11/14/22 1607  PROT 6.4*  ALBUMIN 2.8*  AST 33  ALT 11  ALKPHOS 57  BILITOT 0.5   PT/INR Recent Labs    11/14/22 1607  LABPROT 14.1  INR 1.1   Hepatitis Panel No results for input(s): "HEPBSAG", "HCVAB", "HEPAIGM", "HEPBIGM" in the last 72 hours. C-Diff No results for input(s): "CDIFFTOX" in the last 72 hours. No results for input(s): "CDIFFPCR" in the last 72 hours.   Studies/Results: CT ABDOMEN PELVIS WO CONTRAST  Result Date: 11/14/2022 CLINICAL DATA:  Gastrointestinal bleeding,  abdominal pain for 1 week, hematemesis, dark stools EXAM: CT ABDOMEN AND PELVIS WITHOUT CONTRAST TECHNIQUE: Multidetector CT imaging of the abdomen and pelvis was performed following the standard protocol without IV contrast. Unenhanced CT was performed per clinician order. Lack of IV contrast limits sensitivity and specificity, especially for evaluation of abdominal/pelvic solid viscera. Specifically, gastrointestinal bleeding cannot be evaluated without IV contrast. RADIATION DOSE REDUCTION: This exam was performed according to the departmental dose-optimization program which includes automated exposure control, adjustment of the mA and/or kV according to patient size and/or use of iterative reconstruction technique. COMPARISON:  04/06/2022 FINDINGS: Lower chest: No acute pleural or parenchymal lung disease. Hepatobiliary: Unremarkable unenhanced appearance of the liver and gallbladder. Pancreas: Unremarkable unenhanced appearance. Spleen: Unremarkable unenhanced appearance. Adrenals/Urinary Tract: No urinary tract calculi or obstructive uropathy within either kidney. The adrenals and bladder are unremarkable. Stomach/Bowel: There is persistent abnormality of the gastric antrum and pylorus as seen on prior study. There is abnormal wall thickening throughout the distal antrum and pylorus, with evidence of focal ulceration along the greater curvature of the stomach in the region the gastric antrum, best seen on image 32 of coronal series 5. There is adjacent stranding within the mesenteric fat. High attenuation in this region and within the gastric lumen is nonspecific, but could reflect blood products given history of hematemesis. Correlation with endoscopy is recommended if not previously performed. No bowel obstruction or ileus.  Normal retrocecal appendix. Vascular/Lymphatic: Diffuse atherosclerosis of the abdominal aorta is again noted. 3.1 cm infrarenal abdominal aortic aneurysm is again seen, unchanged.  Evaluation of the vascular lumen is limited without IV contrast. No pathologic adenopathy within the abdomen or pelvis. Subcentimeter lymph nodes surrounding the distal stomach are nonspecific, largest measuring 8 mm in short axis reference image 27/2. Reproductive: Uterus and bilateral adnexa are unremarkable. Other: There is no free fluid or free intraperitoneal gas. No abdominal wall hernia. Musculoskeletal: No acute or destructive bony lesions. Reconstructed images demonstrate no additional findings. IMPRESSION: 1. Progressive gastric antral ulcer, with adjacent mural thickening throughout the distal antrum and pylorus. Differential includes progressive peptic ulcer disease versus underlying mass and malignant ulceration. There is persistent adjacent fat stranding, without evidence of frank perforation or pneumoperitoneum. High attenuation material within the lumen of the stomach adjacent to the ulceration could reflect blood products and active hemorrhage. Correlation with endoscopy is  recommended for further evaluation. 2. Nonspecific subcentimeter mesenteric lymph nodes surrounding the gastric antrum. 3. 3.1 cm infrarenal abdominal aortic aneurysm. Recommend follow-up every 3 years. Reference: J Am Coll Radiol [id number]. 4.  Aortic Atherosclerosis (ICD10-I70.0). These results were called by telephone at the time of interpretation on 11/14/2022 at 5:03 pm to provider [facility] , who verbally acknowledged these results. Electronically Signed   By: Randa Ngo M.D.   On: 11/14/2022 17:09    Scheduled Inpatient Medications:    feeding supplement  237 mL Oral TID BM   folic acid  1 mg Intravenous Daily   [START ON 11/17/2022] multivitamin with minerals  1 tablet Oral Daily   nicotine  14 mg Transdermal Daily   pantoprazole  40 mg Oral BID AC   potassium chloride  40 mEq Oral Once   sodium chloride flush  3 mL Intravenous Q12H   thiamine (VITAMIN B1) injection  100 mg Intravenous Daily     Continuous Inpatient Infusions:    lactated ringers 100 mL/hr at 11/16/22 1331    PRN Inpatient Medications:  acetaminophen, HYDROcodone-acetaminophen, HYDROmorphone (DILAUDID) injection, ondansetron **OR** ondansetron (ZOFRAN) IV, mouth rinse  Miscellaneous: Gastric ulcer biopsies pending  Assessment:  Bleeding, chronic gastric ulcer - Self-limited bleeding. Concerning for neoplasm. Biopsies pending. On po pantoprazole 46m bid. Anemia secondary to gastrointestinal blood loss. - Hgb 7-9.9 Low but stable. Infrarenal AAA  Plan:  Continue current treatment. NSAID avoidance. Biopsies pending, ordered STAT. Continue diet as tolerated.  Erika Kanan K. TAlice Reichert M.D. 11/16/2022, 4:40 PM

## 2022-11-17 DIAGNOSIS — E43 Unspecified severe protein-calorie malnutrition: Secondary | ICD-10-CM

## 2022-11-17 DIAGNOSIS — E876 Hypokalemia: Secondary | ICD-10-CM | POA: Diagnosis present

## 2022-11-17 LAB — BASIC METABOLIC PANEL
Anion gap: 8 (ref 5–15)
BUN: 8 mg/dL (ref 6–20)
CO2: 25 mmol/L (ref 22–32)
Calcium: 7.8 mg/dL — ABNORMAL LOW (ref 8.9–10.3)
Chloride: 99 mmol/L (ref 98–111)
Creatinine, Ser: 0.37 mg/dL — ABNORMAL LOW (ref 0.44–1.00)
GFR, Estimated: >60 mL/min (ref >60)
Glucose, Bld: 98 mg/dL (ref 70–99)
Potassium: 2.8 mmol/L — ABNORMAL LOW (ref 3.5–5.1)
Sodium: 132 mmol/L — ABNORMAL LOW (ref 135–145)

## 2022-11-17 LAB — SURGICAL PATHOLOGY

## 2022-11-17 LAB — FERRITIN: Ferritin: 57 ng/mL (ref 11–307)

## 2022-11-17 LAB — CBC
HCT: 23.7 % — ABNORMAL LOW (ref 36.0–46.0)
Hemoglobin: 7.5 g/dL — ABNORMAL LOW (ref 12.0–15.0)
MCH: 27.7 pg (ref 26.0–34.0)
MCHC: 31.6 g/dL (ref 30.0–36.0)
MCV: 87.5 fL (ref 80.0–100.0)
Platelets: 278 10*3/uL (ref 150–400)
RBC: 2.71 MIL/uL — ABNORMAL LOW (ref 3.87–5.11)
RDW: 14 % (ref 11.5–15.5)
WBC: 9.9 10*3/uL (ref 4.0–10.5)
nRBC: 0 % (ref 0.0–0.2)

## 2022-11-17 LAB — CEA: CEA: 1.9 ng/mL (ref 0.0–4.7)

## 2022-11-17 LAB — IRON AND TIBC
Iron: 12 ug/dL — ABNORMAL LOW (ref 28–170)
Saturation Ratios: 5 % — ABNORMAL LOW (ref 10.4–31.8)
TIBC: 249 ug/dL — ABNORMAL LOW (ref 250–450)
UIBC: 237 ug/dL

## 2022-11-17 LAB — VITAMIN B12: Vitamin B-12: 221 pg/mL (ref 180–914)

## 2022-11-17 LAB — FOLATE: Folate: 20.7 ng/mL (ref >5.9)

## 2022-11-17 LAB — MAGNESIUM: Magnesium: 1.8 mg/dL (ref 1.7–2.4)

## 2022-11-17 LAB — PHOSPHORUS: Phosphorus: 2.8 mg/dL (ref 2.5–4.6)

## 2022-11-17 MED ORDER — POTASSIUM CHLORIDE CRYS ER 20 MEQ PO TBCR
40.0000 meq | EXTENDED_RELEASE_TABLET | Freq: Once | ORAL | Status: AC
  Administered 2022-11-17: 40 meq via ORAL
  Filled 2022-11-17: qty 2

## 2022-11-17 MED ORDER — HYDROMORPHONE HCL 1 MG/ML IJ SOLN
1.0000 mg | INTRAMUSCULAR | Status: DC | PRN
  Administered 2022-11-17 – 2022-11-19 (×12): 1 mg via INTRAVENOUS
  Filled 2022-11-17 (×13): qty 1

## 2022-11-17 MED ORDER — POTASSIUM CHLORIDE 2 MEQ/ML IV SOLN
INTRAVENOUS | Status: DC
  Filled 2022-11-17 (×13): qty 1000

## 2022-11-17 MED ORDER — CLARITHROMYCIN 500 MG PO TABS
500.0000 mg | ORAL_TABLET | Freq: Two times a day (BID) | ORAL | Status: DC
  Administered 2022-11-17 – 2022-11-18 (×3): 500 mg via ORAL
  Filled 2022-11-17 (×4): qty 1

## 2022-11-17 MED ORDER — HYDROCODONE-ACETAMINOPHEN 5-325 MG PO TABS: 1.0000 | ORAL_TABLET | ORAL | Status: DC | PRN

## 2022-11-17 MED ORDER — OXYCODONE HCL 5 MG PO TABS
5.0000 mg | ORAL_TABLET | ORAL | Status: DC | PRN
  Administered 2022-11-17 – 2022-11-24 (×30): 5 mg via ORAL
  Filled 2022-11-17 (×31): qty 1

## 2022-11-17 MED ORDER — MAGNESIUM SULFATE 2 GM/50ML IV SOLN
2.0000 g | Freq: Once | INTRAVENOUS | Status: AC
  Administered 2022-11-17: 2 g via INTRAVENOUS
  Filled 2022-11-17: qty 50

## 2022-11-17 MED ORDER — METRONIDAZOLE 500 MG PO TABS
500.0000 mg | ORAL_TABLET | Freq: Three times a day (TID) | ORAL | Status: DC
  Administered 2022-11-17 – 2022-11-18 (×5): 500 mg via ORAL
  Filled 2022-11-17 (×6): qty 1

## 2022-11-17 MED ORDER — PANTOPRAZOLE SODIUM 40 MG PO TBEC
40.0000 mg | DELAYED_RELEASE_TABLET | Freq: Two times a day (BID) | ORAL | Status: DC
  Administered 2022-11-17 – 2022-11-18 (×2): 40 mg via ORAL
  Filled 2022-11-17 (×2): qty 1

## 2022-11-17 NOTE — Assessment & Plan Note (Signed)
Patient reports several month history of unintentional weight loss.  Per chart review, patient weighed 59 kg in April 2023 and currently weighs approximately 44 kg.  Given lack of age-appropriate cancer screening, this is concerning for an occult malignancy.   -Talked with daughter yesterday over phone.  She reports about 40 pound unintentional weight loss over the last 84-month-EGD on 2/18 showed large stomach ulcer in the antrum (at least 3 x 3 cm) with heaped up edges somewhat concerning but not diagnostic of cancer. Biopsies taken from edges of ulcer. Large clots on ulcer base not removed after attempt to lavage clear, no active bleeding. Another large ulcer (2cm) near bottom of stomach near pyloric channel also noted but no evidence of bleeding   - EGD biopsy does not show malignancy, H. pylori pending - CEA pending.  Outpatient follow-up with Dr. RJanese Banksat cancer center for IV iron and B12 injection

## 2022-11-17 NOTE — Assessment & Plan Note (Signed)
In the setting of upper GI bleed. Status post 1 PRBC transfusion on 2/18 for hemoglobin of 7.  May need another unit if her hemoglobin drops further tomorrow     Latest Ref Rng & Units 11/17/2022    2:49 AM 11/16/2022    3:40 AM 11/15/2022    4:33 PM  CBC  WBC 4.0 - 10.5 K/uL 9.9  13.9  15.1   Hemoglobin 12.0 - 15.0 g/dL 7.5  7.4  7.0   Hematocrit 36.0 - 46.0 % 23.7  23.2  22.0   Platelets 150 - 400 K/uL 278  237  239

## 2022-11-17 NOTE — Assessment & Plan Note (Signed)
Dietitian consult.  Nutritional supplement added Outpatient follow-up cancer center for IV iron and B12 injection

## 2022-11-17 NOTE — Final Progress Note (Addendum)
H. pylori positive.  Triple therapy (clarithromycin + Flagyl + Protonix), started.

## 2022-11-17 NOTE — Assessment & Plan Note (Signed)
Replete and recheck, check magnesium

## 2022-11-17 NOTE — Progress Notes (Addendum)
Progress Note   Patient: Erika Downs [id number] DOB: [birth year] DOA: 11/14/2022     3 DOS: the patient was seen and examined on 11/17/2022   Brief hospital course:  62 y.o. female with medical history significant of hyperlipidemia who presents to the ED due to abdominal pain and bloody vomit  Admitted for acute blood loss anemia likely due to upper GI bleed  2/18: EGD, transfer to floor 2/19: EGD showing 2 large ulcer (nonbleeding) and heaped up edges at the ulcer concerning for malignancy.  Pending biopsy, oncology consult 2/20: Still in a lot of pain, increase the dose of Dilaudid and oxycodone, palliative care consult  Assessment and Plan: * Acute upper GI bleed Patient presenting with 3 days of vomiting and abdominal pain, with hematemesis developing last night in addition to melena.  CT with evidence of progressive gastric antral ulcer with mural thickening and adjacent fat stranding.  Lumen of the stomach with evidence of blood products concerning for active hemorrhage.    -EGD on 2/18 showed large stomach ulcer in the antrum (at least 3 x 3 cm) with heaped up edges somewhat concerning but not diagnostic of cancer. Biopsies taken from edges of ulcer. Large clots on ulcer base not removed after attempt to lavage clear, no active bleeding. Another large ulcer (2cm) near bottom of stomach near pyloric channel also noted but no evidence of bleeding  - Protonix 40 mg p.o. twice daily -Outpatient follow-up with GI /Dr. Alice Reichert in 1 month - strict abstinence from NSAIDS AND ALCOHOL.  Likely repeat endoscopy in 10-12 weeks per GI as an outpatient.    Acute blood loss anemia In the setting of upper GI bleed. Status post 1 PRBC transfusion on 2/18 for hemoglobin of 7.  May need another unit if her hemoglobin drops further tomorrow     Latest Ref Rng & Units 11/17/2022    2:49 AM 11/16/2022    3:40 AM 11/15/2022    4:33 PM  CBC  WBC 4.0 - 10.5 K/uL 9.9  13.9  15.1   Hemoglobin 12.0 -  15.0 g/dL 7.5  7.4  7.0   Hematocrit 36.0 - 46.0 % 23.7  23.2  22.0   Platelets 150 - 400 K/uL 278  237  239     She also has iron and B12 deficiency for which she will need outpatient follow-up with oncology/Dr. Janese Banks for IV iron infusion and B12 injections  Unintentional weight loss Patient reports several month history of unintentional weight loss.  Per chart review, patient weighed 60 kg in April 2023 and currently weighs approximately 44 kg.  Given lack of age-appropriate cancer screening, this is concerning for an occult malignancy.   -Talked with daughter yesterday over phone.  She reports about 40 pound unintentional weight loss over the last 13-month-EGD on 2/18 showed large stomach ulcer in the antrum (at least 3 x 3 cm) with heaped up edges somewhat concerning but not diagnostic of cancer. Biopsies taken from edges of ulcer. Large clots on ulcer base not removed after attempt to lavage clear, no active bleeding. Another large ulcer (2cm) near bottom of stomach near pyloric channel also noted but no evidence of bleeding   - EGD biopsy does not show malignancy, H. pylori pending - CEA pending.  Outpatient follow-up with Dr. RJanese Banksat cancer center for IV iron and B12 injection   Hypokalemia Replete and recheck, check magnesium  Protein-calorie malnutrition, severe Dietitian consult.  Nutritional supplement added Outpatient follow-up cancer center for  IV iron and B12 injection  Tobacco use - Nicotine patch ordered, counseled        Subjective: Still having a lot of abdominal pain.  Boyfriend at bedside  Physical Exam: Vitals:   11/16/22 1908 11/17/22 0018 11/17/22 0151 11/17/22 0325  BP: 125/68   130/83  Pulse: 92   85  Resp: 16   20  Temp: 98.7 F (37.1 C)   98.3 F (36.8 C)  TempSrc: Oral     SpO2: 96%   98%  Weight:  59.9 kg 59.9 kg   Height:       62 year old female who is underweight and looking chronically sick Lungs clear to auscultation  bilaterally Cardiovascular regular rate and rhythm Abdomen: Epigastric and left upper quadrant tenderness, no rigidity or guarding Skin: Pale  Neuro alert and awake, nonfocal Psych normal mood and affect Data Reviewed:  Hemoglobin 7.5, iron and B12 deficiency, stomach tissue biopsy is negative for malignancy  Family Communication: Wife and was updated at bedside  Disposition: Status is: Inpatient Remains inpatient appropriate because: Ongoing abdominal pain and anemia, GI bleed management  Planned Discharge Destination: Home   DVT prophylaxis-SCDs Time spent: 35 minutes  Author: Max Sane, MD 11/17/2022 2:20 PM  For on call review [email or web address].

## 2022-11-17 NOTE — Plan of Care (Signed)

## 2022-11-17 NOTE — Plan of Care (Signed)
GOC consult noted. In to see patient. No family at bedside.  Nurse is currently working with patient and her phone is also ringing. Discussed following up tomorrow morning and patient desires this.

## 2022-11-17 NOTE — Assessment & Plan Note (Signed)
In the setting of upper GI bleed. Status post 1 PRBC transfusion on 2/18 for hemoglobin of 7.  May need another unit if her hemoglobin drops further tomorrow     Latest Ref Rng & Units 11/17/2022    2:49 AM 11/16/2022    3:40 AM 11/15/2022    4:33 PM  CBC  WBC 4.0 - 10.5 K/uL 9.9  13.9  15.1   Hemoglobin 12.0 - 15.0 g/dL 7.5  7.4  7.0   Hematocrit 36.0 - 46.0 % 23.7  23.2  22.0   Platelets 150 - 400 K/uL 278  237  239     She also has iron and B12 deficiency for which she will need outpatient follow-up with oncology/Dr. Janese Banks for IV iron infusion and B12 injections

## 2022-11-17 NOTE — Progress Notes (Signed)
[place] [facility]lyte Monitoring and Replacement   Recent Labs: Potassium (mmol/L)  Date Value  11/17/2022 2.8 (L)  09/12/2012 3.7   Magnesium (mg/dL)  Date Value  11/17/2022 1.8   Calcium (mg/dL)  Date Value  11/17/2022 7.8 (L)   Calcium, Total (mg/dL)  Date Value  09/12/2012 9.2   Albumin (g/dL)  Date Value  11/14/2022 2.8 (L)  09/12/2012 3.9   Phosphorus (mg/dL)  Date Value  11/17/2022 2.8   Sodium (mmol/L)  Date Value  11/17/2022 132 (L)  09/12/2012 141     Assessment: 62 y/o female with h/o bladder prolapse s/p sling surgery, hiatal hernia and HLD who is admitted with GIB. Pharmacy is asked to follow and replace electrolytes.   MIVF: lactated ringers infusion at 100 mL/hr  Goal of Therapy:  electrolytes within normal limits  Plan:  40 mEq oral KCl x 1 (MD order) Add 20 mEq KCl/liter to lactated ringers infusion  at 100 mL/hr 2 grams IV magnesium sulfate x 1 Recheck electrolytes in am  Erika Downs ,PharmD Clinical Pharmacist 11/17/2022 1:11 PM

## 2022-11-17 NOTE — Assessment & Plan Note (Signed)
Patient presenting with 3 days of vomiting and abdominal pain, with hematemesis developing last night in addition to melena.  CT with evidence of progressive gastric antral ulcer with mural thickening and adjacent fat stranding.  Lumen of the stomach with evidence of blood products concerning for active hemorrhage.    -EGD on 2/18 showed large stomach ulcer in the antrum (at least 3 x 3 cm) with heaped up edges somewhat concerning but not diagnostic of cancer. Biopsies taken from edges of ulcer. Large clots on ulcer base not removed after attempt to lavage clear, no active bleeding. Another large ulcer (2cm) near bottom of stomach near pyloric channel also noted but no evidence of bleeding  - Protonix 40 mg p.o. twice daily -Outpatient follow-up with GI

## 2022-11-17 NOTE — Assessment & Plan Note (Signed)
Patient presenting with 3 days of vomiting and abdominal pain, with hematemesis developing last night in addition to melena.  CT with evidence of progressive gastric antral ulcer with mural thickening and adjacent fat stranding.  Lumen of the stomach with evidence of blood products concerning for active hemorrhage.    -EGD on 2/18 showed large stomach ulcer in the antrum (at least 3 x 3 cm) with heaped up edges somewhat concerning but not diagnostic of cancer. Biopsies taken from edges of ulcer. Large clots on ulcer base not removed after attempt to lavage clear, no active bleeding. Another large ulcer (2cm) near bottom of stomach near pyloric channel also noted but no evidence of bleeding  - Protonix 40 mg p.o. twice daily -Outpatient follow-up with GI /Dr. Alice Reichert in 1 month - strict abstinence from NSAIDS AND ALCOHOL.  Likely repeat endoscopy in 10-12 weeks per GI as an outpatient.

## 2022-11-17 NOTE — Plan of Care (Signed)
  Problem: Education: Goal: Knowledge of General Education information will improve Description: Including pain rating scale, medication(s)/side effects and non-pharmacologic comfort measures 11/17/2022 1952 by Samuella Cota, RN Outcome: Progressing 11/17/2022 1951 by Samuella Cota, RN Outcome: Progressing   Problem: Health Behavior/Discharge Planning: Goal: Ability to manage health-related needs will improve 11/17/2022 1952 by Samuella Cota, RN Outcome: Progressing 11/17/2022 1951 by Samuella Cota, RN Outcome: Progressing   Problem: Clinical Measurements: Goal: Ability to maintain clinical measurements within normal limits will improve 11/17/2022 1952 by Samuella Cota, RN Outcome: Progressing 11/17/2022 1951 by Samuella Cota, RN Outcome: Progressing Goal: Will remain free from infection 11/17/2022 1952 by Samuella Cota, RN Outcome: Progressing 11/17/2022 1951 by Samuella Cota, RN Outcome: Progressing Goal: Diagnostic test results will improve 11/17/2022 1952 by Samuella Cota, RN Outcome: Progressing 11/17/2022 1951 by Samuella Cota, RN Outcome: Progressing Goal: Respiratory complications will improve 11/17/2022 1952 by Samuella Cota, RN Outcome: Progressing 11/17/2022 1951 by Samuella Cota, RN Outcome: Progressing Goal: Cardiovascular complication will be avoided 11/17/2022 1952 by Samuella Cota, RN Outcome: Progressing 11/17/2022 1951 by Samuella Cota, RN Outcome: Progressing   Problem: Activity: Goal: Risk for activity intolerance will decrease 11/17/2022 1952 by Samuella Cota, RN Outcome: Progressing 11/17/2022 1951 by Samuella Cota, RN Outcome: Progressing   Problem: Nutrition: Goal: Adequate nutrition will be maintained 11/17/2022 1952 by Samuella Cota, RN Outcome: Progressing 11/17/2022 1951 by Samuella Cota, RN Outcome: Progressing   Problem: Coping: Goal: Level of anxiety will  decrease 11/17/2022 1952 by Samuella Cota, RN Outcome: Progressing 11/17/2022 1951 by Samuella Cota, RN Outcome: Progressing   Problem: Elimination: Goal: Will not experience complications related to bowel motility 11/17/2022 1952 by Samuella Cota, RN Outcome: Progressing 11/17/2022 1951 by Samuella Cota, RN Outcome: Progressing Goal: Will not experience complications related to urinary retention 11/17/2022 1952 by Samuella Cota, RN Outcome: Progressing 11/17/2022 1951 by Samuella Cota, RN Outcome: Progressing   Problem: Pain Managment: Goal: General experience of comfort will improve 11/17/2022 1952 by Samuella Cota, RN Outcome: Progressing 11/17/2022 1951 by Samuella Cota, RN Outcome: Progressing   Problem: Safety: Goal: Ability to remain free from injury will improve 11/17/2022 1952 by Samuella Cota, RN Outcome: Progressing 11/17/2022 1951 by Samuella Cota, RN Outcome: Progressing   Problem: Skin Integrity: Goal: Risk for impaired skin integrity will decrease 11/17/2022 1952 by Samuella Cota, RN Outcome: Progressing 11/17/2022 1951 by Samuella Cota, RN Outcome: Progressing

## 2022-11-18 ENCOUNTER — Inpatient Hospital Stay: Payer: Medicaid Other

## 2022-11-18 ENCOUNTER — Other Ambulatory Visit: Payer: Self-pay | Admitting: *Deleted

## 2022-11-18 LAB — BASIC METABOLIC PANEL
Anion gap: 10 (ref 5–15)
BUN: 5 mg/dL — ABNORMAL LOW (ref 6–20)
CO2: 26 mmol/L (ref 22–32)
Calcium: 8.2 mg/dL — ABNORMAL LOW (ref 8.9–10.3)
Chloride: 95 mmol/L — ABNORMAL LOW (ref 98–111)
Creatinine, Ser: 0.38 mg/dL — ABNORMAL LOW (ref 0.44–1.00)
GFR, Estimated: >60 mL/min (ref >60)
Glucose, Bld: 94 mg/dL (ref 70–99)
Potassium: 3.2 mmol/L — ABNORMAL LOW (ref 3.5–5.1)
Sodium: 131 mmol/L — ABNORMAL LOW (ref 135–145)

## 2022-11-18 LAB — CBC
HCT: 27.2 % — ABNORMAL LOW (ref 36.0–46.0)
Hemoglobin: 8.8 g/dL — ABNORMAL LOW (ref 12.0–15.0)
MCH: 28.2 pg (ref 26.0–34.0)
MCHC: 32.4 g/dL (ref 30.0–36.0)
MCV: 87.2 fL (ref 80.0–100.0)
Platelets: 386 10*3/uL (ref 150–400)
RBC: 3.12 MIL/uL — ABNORMAL LOW (ref 3.87–5.11)
RDW: 13.5 % (ref 11.5–15.5)
WBC: 8.2 10*3/uL (ref 4.0–10.5)
nRBC: 0 % (ref 0.0–0.2)

## 2022-11-18 LAB — PHOSPHORUS: Phosphorus: 3.5 mg/dL (ref 2.5–4.6)

## 2022-11-18 LAB — MAGNESIUM: Magnesium: 1.6 mg/dL — ABNORMAL LOW (ref 1.7–2.4)

## 2022-11-18 MED ORDER — CYANOCOBALAMIN 1000 MCG/ML IJ SOLN
1000.0000 ug | Freq: Once | INTRAMUSCULAR | Status: AC
  Administered 2022-11-18: 1000 ug via INTRAMUSCULAR
  Filled 2022-11-18: qty 1

## 2022-11-18 MED ORDER — POTASSIUM CHLORIDE CRYS ER 20 MEQ PO TBCR
20.0000 meq | EXTENDED_RELEASE_TABLET | ORAL | Status: DC
  Administered 2022-11-18: 20 meq via ORAL
  Filled 2022-11-18: qty 1

## 2022-11-18 MED ORDER — MAGNESIUM OXIDE -MG SUPPLEMENT 400 (240 MG) MG PO TABS
200.0000 mg | ORAL_TABLET | Freq: Two times a day (BID) | ORAL | Status: DC
  Administered 2022-11-18 – 2022-11-24 (×13): 200 mg via ORAL
  Filled 2022-11-18 (×13): qty 1

## 2022-11-18 MED ORDER — MAGNESIUM SULFATE 2 GM/50ML IV SOLN
2.0000 g | Freq: Once | INTRAVENOUS | Status: AC
  Administered 2022-11-18: 2 g via INTRAVENOUS
  Filled 2022-11-18: qty 50

## 2022-11-18 MED ORDER — POTASSIUM CHLORIDE 10 MEQ/100ML IV SOLN
10.0000 meq | INTRAVENOUS | Status: AC
  Administered 2022-11-18 (×3): 10 meq via INTRAVENOUS
  Filled 2022-11-18 (×3): qty 100

## 2022-11-18 MED ORDER — PANTOPRAZOLE SODIUM 40 MG IV SOLR
40.0000 mg | Freq: Two times a day (BID) | INTRAVENOUS | Status: DC
  Administered 2022-11-18 – 2022-11-20 (×4): 40 mg via INTRAVENOUS
  Filled 2022-11-18 (×4): qty 10

## 2022-11-18 MED ORDER — POTASSIUM CHLORIDE CRYS ER 20 MEQ PO TBCR: 40.0000 meq | EXTENDED_RELEASE_TABLET | Freq: Once | ORAL | Status: DC

## 2022-11-18 MED ORDER — PANTOPRAZOLE SODIUM 40 MG IV SOLR: 40.0000 mg | Freq: Two times a day (BID) | INTRAVENOUS | Status: DC

## 2022-11-18 MED ORDER — BISACODYL 5 MG PO TBEC
10.0000 mg | DELAYED_RELEASE_TABLET | Freq: Once | ORAL | Status: AC
  Administered 2022-11-18: 10 mg via ORAL
  Filled 2022-11-18: qty 2

## 2022-11-18 MED ORDER — SODIUM CHLORIDE 0.9 % IV SOLN
250.0000 mg | Freq: Once | INTRAVENOUS | Status: AC
  Administered 2022-11-18: 250 mg via INTRAVENOUS
  Filled 2022-11-18: qty 250

## 2022-11-18 MED ORDER — BARIUM SULFATE 2 % PO SUSP
450.0000 mL | Freq: Once | ORAL | Status: AC
  Administered 2022-11-18: 450 mL via ORAL

## 2022-11-18 NOTE — Progress Notes (Signed)
GI Inpatient Follow-up Note  Subjective:  Patient seen in follow-up for large gastric ulcers (2) in antrum and pre-pyloric stomach. Hospitalist asked [place] to see again this afternoon for concerns of worsening abdominal pain. No acute events overnight. She was started on H pylori eradication regimen last night. She had KUB this morning which showed concerns for possible developing colonic obstruction or ileus. She reports worsening epigastric abdominal pain described as stabbing, shooting-type pains. Pain currently rated 10/10 on pain scale. She denies any nausea or vomiting. She denies any overt hematochezia or melena. Hemoglobin 8.8 this morning. She is on oxycodone and Dilaudid for severe pain.   Scheduled Inpatient Medications:   bisacodyl  10 mg Oral Once   clarithromycin  500 mg Oral Q12H   And   metroNIDAZOLE  500 mg Oral TID   feeding supplement  237 mL Oral TID BM   folic acid  1 mg Intravenous Daily   magnesium oxide  200 mg Oral BID   multivitamin with minerals  1 tablet Oral Daily   nicotine  14 mg Transdermal Daily   pantoprazole (PROTONIX) IV  40 mg Intravenous Q12H   sodium chloride flush  3 mL Intravenous Q12H   thiamine (VITAMIN B1) injection  100 mg Intravenous Daily    Continuous Inpatient Infusions:    lactated ringers 1,000 mL with potassium chloride 20 mEq infusion 100 mL/hr at 11/18/22 0905    PRN Inpatient Medications:  acetaminophen, HYDROmorphone (DILAUDID) injection, ondansetron **OR** ondansetron (ZOFRAN) IV, mouth rinse, oxyCODONE  Review of Systems: Constitutional: Weight is stable.  Eyes: No changes in vision. ENT: No oral lesions, sore throat.  GI: see HPI.  Heme/Lymph: No easy bruising.  CV: No chest pain.  GU: No hematuria.  Integumentary: No rashes.  Neuro: No headaches.  Psych: No depression/anxiety.  Endocrine: No heat/cold intolerance.  Allergic/Immunologic: No urticaria.  Resp: No cough, SOB.  Musculoskeletal: No joint swelling.     Physical Examination: BP (!) 149/91 (BP Location: Left Arm)   Pulse 89   Temp 98.6 F (37 C) (Oral)   Resp 18   Ht 5' 2"$  (1.575 m)   Wt 59.9 kg   SpO2 96%   BMI 24.15 kg/m  Gen: NAD, alert and oriented x 4 HEENT: PEERLA, EOMI, Neck: supple, no JVD or thyromegaly Chest: CTA bilaterally, no wheezes, crackles, or other adventitious sounds CV: RRR, no m/g/c/r Abd: soft, nondistended, +BS in all four quadrants; tender to light and deep palpation diffusely in all four quadrants worse in epigastrium, no HSM, guarding, ridigity, or rebound tenderness Ext: no edema, well perfused with 2+ pulses, Skin: no rash or lesions noted Lymph: no LAD  Data: Lab Results  Component Value Date   WBC 8.2 11/18/2022   HGB 8.8 (L) 11/18/2022   HCT 27.2 (L) 11/18/2022   MCV 87.2 11/18/2022   PLT 386 11/18/2022   Recent Labs  Lab 11/16/22 0340 11/17/22 0249 11/18/22 0505  HGB 7.4* 7.5* 8.8*   Lab Results  Component Value Date   NA 131 (L) 11/18/2022   K 3.2 (L) 11/18/2022   CL 95 (L) 11/18/2022   CO2 26 11/18/2022   BUN 5 (L) 11/18/2022   CREATININE 0.38 (L) 11/18/2022   Lab Results  Component Value Date   ALT 11 11/14/2022   AST 33 11/14/2022   ALKPHOS 57 11/14/2022   BILITOT 0.5 11/14/2022   Recent Labs  Lab 11/14/22 1607  INR 1.1   KUB 11/18/2022: IMPRESSION: 1. Increasing colonic dilation  with ahaustral appearance of the colon. Findings may reflect developing colonic obstruction in the setting of colitis or ileus related to potential local inflammation incited by ulcer disease described on previous imaging. Close follow-up and GI consultation may be warranted. Stomach not well assessed on current imaging. If there is worsening pain could consider CT with oral contrast media as warranted. 2. No signs of rectal gas.  EGD 11/15/2022: Impression:             - Normal esophagus. - 3 cm hiatal hernia. - Non-bleeding gastric ulcer with adherent clot. - Non-bleeding gastric  ulcer with no stigmata of bleeding. - Normal examined duodenum. - Red blood in the gastric body. - The examination was otherwise normal. - No specimens collected.  EGD Path: DIAGNOSIS:  A. STOMACH, ANTRUM, EDGE OFULCER; BIOPSY:  - ULCERATED GASTRIC MUCOSA WITH SURFACE INFLAMMATORY EXUDATE AND FOCAL FIBROBLASTIC REACTION CONSISTENT WITH BED OF ULCER.  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  - NO MALIGNANCY IDENTIFIED ON MULTIPLE LEVELS OF SECTIONING.   CT Abd/Pelvis WO contrast 11/14/2022: IMPRESSION: 1. Progressive gastric antral ulcer, with adjacent mural thickening throughout the distal antrum and pylorus. Differential includes progressive peptic ulcer disease versus underlying mass and malignant ulceration. There is persistent adjacent fat stranding, without evidence of frank perforation or pneumoperitoneum. High attenuation material within the lumen of the stomach adjacent to the ulceration could reflect blood products and active hemorrhage. Correlation with endoscopy is recommended for further evaluation. 2. Nonspecific subcentimeter mesenteric lymph nodes surrounding the gastric antrum. 3. 3.1 cm infrarenal abdominal aortic aneurysm. Recommend follow-up every 3 years. Reference: J Am Coll Radiol [id number]. 4.  Aortic Atherosclerosis (ICD10-I70.0).   Assessment/Plan:   62 y/o Caucasian female with a PMH of HLD and tobacco abuse presented to the [facility] ED 2/17 for 3-day history of progressive epigastric abdominal pain with associated nausea, vomiting, hematemesis, and melena. She was found to have acute blood loss anemia with drop in hemoglobin from baseline >14 to 8.0 with non-contrasted CT scan concerning for progressive gastric antral ulcer versus neoplasm and malignancy ulceration. She is s/p EGD 2/18 with Dr. Alice Reichert which showed one non-bleeding gastric ulcer with adherent clot in antrum with heaped up edges of ulcer  suspicious for malignancy and one non-bleeding cratered gastric ulcer with no stigmata of bleeding found in prepyloric region of stomach   Acute blood loss anemia/UGIB 2/2 gastric ulcers  H pylori infection 2/2 PUD - on triple therapy with PPI + Clarithromycin + Flagyl   Epigastric abdominal pain - possibly 2/2 PUD, ileus, developing colonic obstruction, gastroparesis, MSK, etc   Nausea/vomiting - resolved    Hematemesis/Melena - resolved    Unintentional weight loss - 65-lbs x <1 year   Tobacco abuse   DNR status   Recommendations:  - Continue supportive care with pain control, anti-emetics, IV fluid hydration PRN - Attempt to wean narcotics as tolerated as this could be contributing to ileus versus developing colonic obstruction  - Agree with CT abd/pelvis with oral contrast for further evaluation - Follow-up on results of CT scan this afternoon - Continue H pylori eradication therapy - Continue to monitor serial H&H. Transfuse for Hgb <7.0.  - No signs of recurrent GI bleeding - Continue serial abdominal examinations  - Replete electrolytes as necessary per primary team - Appreciate palliative care following to update goals of care - GI following along with you   Please call with questions or concerns.   Octavia Bruckner, PA-C [facility] [phone]

## 2022-11-18 NOTE — Plan of Care (Signed)
Patient AOX4, VSS throughout shift.  All meds given on time as ordered.  Diminished lungs, IS encouraged. Pt c/o pain relieved by PRN pain meds.  Continuous fluids on-going.  POC maintained, will continue to monitor.  Problem: Education: Goal: Knowledge of General Education information will improve Description: Including pain rating scale, medication(s)/side effects and non-pharmacologic comfort measures Outcome: Progressing   Problem: Health Behavior/Discharge Planning: Goal: Ability to manage health-related needs will improve Outcome: Progressing   Problem: Clinical Measurements: Goal: Ability to maintain clinical measurements within normal limits will improve Outcome: Progressing Goal: Will remain free from infection Outcome: Progressing Goal: Diagnostic test results will improve Outcome: Progressing Goal: Respiratory complications will improve Outcome: Progressing Goal: Cardiovascular complication will be avoided Outcome: Progressing   Problem: Activity: Goal: Risk for activity intolerance will decrease Outcome: Progressing   Problem: Nutrition: Goal: Adequate nutrition will be maintained Outcome: Progressing   Problem: Coping: Goal: Level of anxiety will decrease Outcome: Progressing   Problem: Elimination: Goal: Will not experience complications related to bowel motility Outcome: Progressing Goal: Will not experience complications related to urinary retention Outcome: Progressing   Problem: Pain Managment: Goal: General experience of comfort will improve Outcome: Progressing   Problem: Safety: Goal: Ability to remain free from injury will improve Outcome: Progressing   Problem: Skin Integrity: Goal: Risk for impaired skin integrity will decrease Outcome: Progressing

## 2022-11-18 NOTE — Consult Note (Signed)
Consultation Note Date: 11/18/2022   Patient Name: Erika Downs  DOB: Apr 18, 1961  MRN: [id number]  Age / Sex: 62 y.o., female  PCP: Pcp, No Referring Physician: Elmarie Shiley, MD  Reason for Consultation: Establishing goals of care  HPI/Patient Profile: Erika Downs is a 62 y.o. female with medical history significant of hyperlipidemia who presents to the ED due to abdominal pain and bloody vomit.   Clinical Assessment and Goals of Care: Notes and labs reviewed. In to see patient. She states she is having abdominal pain and is waiting for a CT scan.   She states she lives at home. She has a significant other of 12 years. She does not have children. Her parents are deceased; she has 3 siblings. She states she would want her significant other to be her surrogate decision maker and not her sisters. She shares that "he is slow in understanding things" and needs detailed explanations.   In regards to boundaries in care, she confirms DNR. She states she would never want to be placed on a ventilator, have a feeding tube placed, or have dialysis. She states she will discuss this with her significant other, in addition to talking to him about making him HPOA. Discussed that I would follow up tomorrow.     SUMMARY OF RECOMMENDATIONS   DNR/ DNI.  Patient states she would want her significant other to be her surrogate decision maker in place of her sisters.   Prognosis:  Unable to determine      Primary Diagnoses: Present on Admission:  Acute upper GI bleed  Unintentional weight loss  Acute blood loss anemia  Tobacco use  Protein-calorie malnutrition, severe  Hypokalemia   I have reviewed the medical record, interviewed the patient and family, and examined the patient. The following aspects are pertinent.  Past Medical History:  Diagnosis Date   Chest pain    Hyperlipidemia    Social  History   Socioeconomic History   Marital status: Single    Spouse name: Not on file   Number of children: Not on file   Years of education: Not on file   Highest education level: Not on file  Occupational History   Not on file  Tobacco Use   Smoking status: Every Day    Packs/day: 0.50    Types: Cigarettes   Smokeless tobacco: Never  Vaping Use   Vaping Use: Never used  Substance and Sexual Activity   Alcohol use: Not Currently   Drug use: No   Sexual activity: Not on file  Other Topics Concern   Not on file  Social History Narrative   Not on file   Social Determinants of Health   Financial Resource Strain: Not on file  Food Insecurity: No Food Insecurity (11/16/2022)   Hunger Vital Sign    Worried About Running Out of Food in the Last Year: Never true    Ran Out of Food in the Last Year: Never true  Transportation Needs: No Transportation Needs (11/16/2022)  PRAPARE - Hydrologist (Medical): No    Lack of Transportation (Non-Medical): No  Physical Activity: Not on file  Stress: Not on file  Social Connections: Not on file   History reviewed. No pertinent family history. Scheduled Meds:  barium  450 mL Oral Once   bisacodyl  10 mg Oral Once   clarithromycin  500 mg Oral Q12H   And   metroNIDAZOLE  500 mg Oral TID   feeding supplement  237 mL Oral TID BM   folic acid  1 mg Intravenous Daily   magnesium oxide  200 mg Oral BID   multivitamin with minerals  1 tablet Oral Daily   nicotine  14 mg Transdermal Daily   pantoprazole (PROTONIX) IV  40 mg Intravenous Q12H   sodium chloride flush  3 mL Intravenous Q12H   thiamine (VITAMIN B1) injection  100 mg Intravenous Daily   Continuous Infusions:  lactated ringers 1,000 mL with potassium chloride 20 mEq infusion 100 mL/hr at 11/18/22 0905   PRN Meds:.acetaminophen, HYDROmorphone (DILAUDID) injection, ondansetron **OR** ondansetron (ZOFRAN) IV, mouth rinse, oxyCODONE Medications Prior to  Admission:  Prior to Admission medications   Not on File   Allergies  Allergen Reactions   Ibuprofen Hives and Swelling   Ivp Dye [Iodinated Contrast Media] Hives    PT states she was injected at [facility] and had hives   Penicillins Hives and Swelling    Has patient had a PCN reaction causing immediate rash, facial/tongue/throat swelling, SOB or lightheadedness with hypotension: Yes Has patient had a PCN reaction causing severe rash involving mucus membranes or skin necrosis: No Has patient had a PCN reaction that required hospitalization No Has patient had a PCN reaction occurring within the last 10 years: No If all of the above answers are "NO", then may proceed with Cephalosporin use.    Review of Systems  Gastrointestinal:  Positive for abdominal pain.    Physical Exam Pulmonary:     Effort: Pulmonary effort is normal.  Neurological:     Mental Status: She is alert.     Vital Signs: BP (!) 149/91 (BP Location: Left Arm)   Pulse 89   Temp 98.6 F (37 C) (Oral)   Resp 18   Ht '5\' 2"'$  (1.575 m)   Wt 59.9 kg   SpO2 96%   BMI 24.15 kg/m  Pain Scale: 0-10 POSS *See Group Information*: S-Acceptable,Sleep, easy to arouse Pain Score: Asleep   SpO2: SpO2: 96 % O2 Device:SpO2: 96 % O2 Flow Rate: .O2 Flow Rate (L/min): 2 L/min  IO: Intake/output summary:  Intake/Output Summary (Last 24 hours) at 11/18/2022 1526 Last data filed at 11/18/2022 1430 Gross per 24 hour  Intake 180 ml  Output 3650 ml  Net -3470 ml    LBM: Last BM Date : 11/14/22 Baseline Weight: Weight: 44 kg Most recent weight: Weight: 59.9 kg       Signed by: Asencion Gowda, NP   Please contact Palliative Medicine Team phone at [phone] for questions and concerns.  For individual provider: See Shea Evans

## 2022-11-18 NOTE — Progress Notes (Addendum)
[place] for Electrolyte Monitoring and Replacement   Recent Labs: Potassium (mmol/L)  Date Value  11/18/2022 3.2 (L)  09/12/2012 3.7   Magnesium (mg/dL)  Date Value  11/18/2022 1.6 (L)   Calcium (mg/dL)  Date Value  11/18/2022 8.2 (L)   Calcium, Total (mg/dL)  Date Value  09/12/2012 9.2   Albumin (g/dL)  Date Value  11/14/2022 2.8 (L)  09/12/2012 3.9   Phosphorus (mg/dL)  Date Value  11/18/2022 3.5   Sodium (mmol/L)  Date Value  11/18/2022 131 (L)  09/12/2012 141     Assessment: 62 y/o female with h/o bladder prolapse s/p sling surgery, hiatal hernia and HLD who is admitted with GIB. Pharmacy is asked to follow and replace electrolytes.   MIVF: lactated ringers w/ 20 mEq Kcl per liter infusion at 100 mL/hr  Goal of Therapy:  electrolytes within normal limits  Plan:  Kcl 10 meq IV x 3 (MD order) 20 mEq oral KCl x 1 2 grams IV magnesium sulfate x 1 Recheck electrolytes in am  Lorin Picket ,PharmD Clinical Pharmacist 11/18/2022 7:23 AM

## 2022-11-18 NOTE — Progress Notes (Signed)
PROGRESS NOTE    Erika Downs  [id number] DOB: [birth year] DOA: 11/14/2022 PCP: Pcp, No   Brief Narrative: 62 year old with past medical history significant for hyperlipidemia who presents to the ED with abdominal pain and hematemesis.  Admitted with acute blood loss anemia in the setting of upper GI bleed.  Underwent endoscopy through 2/19 showed 2 large also nonbleeding heaped edges  at the ulcer concerning for malignancy.  Biopsy pending.     Assessment & Plan:   Principal Problem:   Acute upper GI bleed Active Problems:   Acute blood loss anemia   Unintentional weight loss   Tobacco use   Protein-calorie malnutrition, severe   Hypokalemia  1-Acute upper GI bleed, 2 large gastric ulcer -CT with evidence of progressive gastric antral ulcer with mobile thickening and adjacent fat stranding. -2/18 showed large stomach ulcer in the antrum 3 x 3 cm with heaped up edges concerning but not diagnostic of cancer.  Biopsy pending.  Large clot and ulcer base not removed.  Another large ulcerated 2 cm  near bottom of the stomach near the pyloric channel. -Had severe abdominal pain, change PPI back to IV -KUB: Increasing colonic dilation with ahaustral appearance of the colon. Findings may reflect developing colonic obstruction in the setting of colitis or ileus related to potential local inflammation incited by ulcer disease described on previous imaging.  -Dr Alice Reichert informed of KUB results and patient abdominal pain.  Plan to proceed with CT scan to further evaluate.   H Pill[place] positive.  Started on Clarithromycin dn flagyl.  Might need to hold if abdominal pain persist  Acute Blood loss anemia;  In setting GI bleed.  Received one unit PRBC 2/18 Monitor hb.  She will need IV iron and B 12 supplement out patient.  One dose IV iron ordered. IM B 12   Unintentional weight loss: Patient has had 40 pound weight loss over the last 6 months. The biopsy does not show  malignancy  Hypokalemia: Replete orally and IV Hypomagnesemia: replace IV and orally  Protein calorie malnutrition severe ; on nutrition supplement.   Tobacco use nicotine patch ordered counseled.   Nutrition Problem: Severe Malnutrition Etiology: chronic illness    Signs/Symptoms: moderate fat depletion, moderate muscle depletion, percent weight loss Percent weight loss: 25 %       Estimated body mass index is 24.15 kg/m as calculated from the following:   Height as of this encounter: 5' 2"$  (1.575 m).   Weight as of this encounter: 59.9 kg.   DVT prophylaxis: SCD Code Status: DNR Family Communication: Disposition Plan:  Status is: Inpatient Remains inpatient appropriate because: management of gastric ulcer, abdominal pain     Consultants:  Dr  Alice Reichert, GI  Procedures:  Endoscopy   Antimicrobials:    Subjective: She is complaining of abdominal pain, severe. Not worse , not better.   No BM   Objective: Vitals:   11/17/22 0325 11/17/22 1912 11/18/22 0620 11/18/22 0741  BP: 130/83 (!) 169/83 (!) 147/83 (!) 149/91  Pulse: 85 89 85 89  Resp: 20 16 16 18  $ Temp: 98.3 F (36.8 C) 98.6 F (37 C) 98.6 F (37 C) 98.6 F (37 C)  TempSrc:  Oral Oral Oral  SpO2: 98% 98% 92% 96%  Weight:      Height:        Intake/Output Summary (Last 24 hours) at 11/18/2022 0945 Last data filed at 11/18/2022 0745 Gross per 24 hour  Intake --  Output 3550 ml  Net -3550 ml   Filed Weights   11/16/22 1629 11/17/22 0018 11/17/22 0151  Weight: 54.4 kg 59.9 kg 59.9 kg    Examination:  General exam: Appears calm and comfortable  Respiratory system: Clear to auscultation. Respiratory effort normal. Cardiovascular system: S1 & S2 heard, RRR. No JVD, murmurs, rubs, gallops or clicks. No pedal edema. Gastrointestinal system: Abdomen is non distend, very tender.  Central nervous system: Alert and oriented.  Extremities: Symmetric 5 x 5 power.    Data Reviewed: I have  personally reviewed following labs and imaging studies  CBC: Recent Labs  Lab 11/15/22 0950 11/15/22 1633 11/16/22 0340 11/17/22 0249 11/18/22 0505  WBC 16.8* 15.1* 13.9* 9.9 8.2  HGB 7.6* 7.0* 7.4* 7.5* 8.8*  HCT 23.8* 22.0* 23.2* 23.7* 27.2*  MCV 88.1 88.7 88.2 87.5 87.2  PLT 264 239 237 278 [id number]   Basic Metabolic Panel: Recent Labs  Lab 11/14/22 1607 11/15/22 0437 11/16/22 0340 11/17/22 0249 11/18/22 0505  NA 131* 135 132* 132* 131*  K 4.0 3.5 3.0* 2.8* 3.2*  CL 98 103 98 99 95*  CO2 23 27 26 25 26  $ GLUCOSE 124* 120* 94 98 94  BUN 48* 29* 12 8 5*  CREATININE 0.65 0.50 0.37* 0.37* 0.38*  CALCIUM 8.3* 7.7* 7.7* 7.8* 8.2*  MG  --   --  1.6* 1.8 1.6*  PHOS  --   --   --  2.8 3.5   GFR: Estimated Creatinine Clearance: 59.9 mL/min (A) (by C-G formula based on SCr of 0.38 mg/dL (L)). Liver Function Tests: Recent Labs  Lab 11/14/22 1607  AST 33  ALT 11  ALKPHOS 57  BILITOT 0.5  PROT 6.4*  ALBUMIN 2.8*   Recent Labs  Lab 11/14/22 1607  LIPASE 23   No results for input(s): "AMMONIA" in the last 168 hours. Coagulation Profile: Recent Labs  Lab 11/14/22 1607  INR 1.1   Cardiac Enzymes: No results for input(s): "CKTOTAL", "CKMB", "CKMBINDEX", "TROPONINI" in the last 168 hours. BNP (last 3 results) No results for input(s): "PROBNP" in the last 8760 hours. HbA1C: No results for input(s): "HGBA1C" in the last 72 hours. CBG: Recent Labs  Lab 11/14/22 1831  GLUCAP 110*   Lipid Profile: No results for input(s): "CHOL", "HDL", "LDLCALC", "TRIG", "CHOLHDL", "LDLDIRECT" in the last 72 hours. Thyroid Function Tests: No results for input(s): "TSH", "T4TOTAL", "FREET4", "T3FREE", "THYROIDAB" in the last 72 hours. Anemia Panel: Recent Labs    11/17/22 0249  VITAMINB12 221  FOLATE 20.7  FERRITIN 57  TIBC 249*  IRON 12*   Sepsis Labs: No results for input(s): "PROCALCITON", "LATICACIDVEN" in the last 168 hours.  Recent Results (from the past 240 hour(s))   MRSA Next Gen by PCR, Nasal     Status: None   Collection Time: 11/14/22  6:39 PM   Specimen: Nasal Mucosa; Nasal Swab  Result Value Ref Range Status   MRSA by PCR Next Gen NOT DETECTED NOT DETECTED Final    Comment: (NOTE) The GeneXpert MRSA Assay (FDA approved for NASAL specimens only), is one component of a comprehensive MRSA colonization surveillance program. It is not intended to diagnose MRSA infection nor to guide or monitor treatment for MRSA infections. Test performance is not FDA approved in patients less than 42 years old. Performed at [facility], [address], [place], [place] [address]          Radiology Studies: No results found.      Scheduled Meds:  clarithromycin  500 mg Oral Q12H   And   metroNIDAZOLE  500 mg Oral TID   cyanocobalamin  1,000 mcg Intramuscular Once   feeding supplement  237 mL Oral TID BM   folic acid  1 mg Intravenous Daily   magnesium oxide  200 mg Oral BID   multivitamin with minerals  1 tablet Oral Daily   nicotine  14 mg Transdermal Daily   pantoprazole (PROTONIX) IV  40 mg Intravenous Q12H   sodium chloride flush  3 mL Intravenous Q12H   thiamine (VITAMIN B1) injection  100 mg Intravenous Daily   Continuous Infusions:  ferric gluconate (FERRLECIT) IVPB     lactated ringers 1,000 mL with potassium chloride 20 mEq infusion 100 mL/hr at 11/18/22 0905   potassium chloride 10 mEq (11/18/22 0942)     LOS: 4 days    Time spent: 35 minutes    Blue Winther A Sir Mallis, MD [facility]   If 7PM-7AM, please contact night-coverage [email or web address]  11/18/2022, 9:45 AM

## 2022-11-18 NOTE — Evaluation (Signed)
Physical Therapy Evaluation Patient Details Name: Erika Downs MRN: [id number] DOB: 10-Dec-1960 Today's Date: 11/18/2022  History of Present Illness  Ms. Lattin states that she has been experiencing nausea with vomiting that began suddenly approximately 3 days ago.  Shortly thereafter, she developed diffuse abdominal pain that is worst in the epigastric region.  Then last night, her emesis turned bright red and has been bloody ever since.   Clinical Impression  Pt admitted with above diagnosis. Pt received supine in bed agreeable to PT with RN student providing pain medication. Pt reports at baseline she is independent. Boyfriend present stating he can assist pt close to 24/7 but he works. He reports working close to home and can easily reach pt if she needs anything.   To date pt with excellent log roll technique at supervision level standing and ambulating to door and back in room at supervision level.  Frequent need for SUE support on bed, walls, and counter to support which pt endorses she does not typically need at home. Refuses use of RW as it led to her "fracturing my ankle". Educated pt on benefits of SPC to assist in falls prevention instead of RW which pt verbalizes understanding. Pt with all needs in reach back in bed. Pt appears below baseline due to pain, weakness, and gait abnormality. Pt currently with functional limitations due to the deficits listed below (see PT Problem List). Pt will benefit from skilled PT to increase their independence and safety with mobility to allow discharge to the venue listed below.      Recommendations for follow up therapy are one component of a multi-disciplinary discharge planning process, led by the attending physician.  Recommendations may be updated based on patient status, additional functional criteria and insurance authorization.  Follow Up Recommendations Home health PT      Assistance Recommended at Discharge Frequent or constant  Supervision/Assistance  Patient can return home with the following  A little help with walking and/or transfers;A little help with bathing/dressing/bathroom;Assistance with cooking/housework;Assist for transportation;Help with stairs or ramp for entrance    Equipment Recommendations None recommended by PT  Recommendations for Other Services       Functional Status Assessment Patient has had a recent decline in their functional status and demonstrates the ability to make significant improvements in function in a reasonable and predictable amount of time.     Precautions / Restrictions Precautions Precautions: Fall Restrictions Weight Bearing Restrictions: No      Mobility  Bed Mobility Overal bed mobility: Needs Assistance Bed Mobility: Supine to Sit, Sit to Supine     Supine to sit: Supervision Sit to supine: Supervision   General bed mobility comments: understanding of log roll technique to assist in pain management Patient Response: Cooperative  Transfers Overall transfer level: Needs assistance Equipment used: Rolling Sofia (2 wheels) Transfers: Sit to/from Stand Sit to Stand: Supervision                Ambulation/Gait Ambulation/Gait assistance: Min guard Gait Distance (Feet): 40 Feet Assistive device: None, IV Pole Gait Pattern/deviations: Step-to pattern, Decreased step length - right, Decreased step length - left, Drifts right/left, Narrow base of support       General Gait Details: Pt generally unsteady with gait relying on frequent SUE support on wall, bed and counter to support herself with gait.  Stairs            Wheelchair Mobility    Modified Rankin (Stroke Patients Only)  Balance Overall balance assessment: Needs assistance Sitting-balance support: Bilateral upper extremity supported, Feet supported Sitting balance-Leahy Scale: Fair     Standing balance support: No upper extremity supported Standing balance-Leahy Scale:  Fair Standing balance comment: can maintain standing without UE support                             Pertinent Vitals/Pain Pain Assessment Pain Assessment: 0-10 Pain Score: 8  Pain Location: abdomen Pain Descriptors / Indicators: Aching, Discomfort, Grimacing Pain Intervention(s): Limited activity within patient's tolerance, Monitored during session, Repositioned    Home Living Family/patient expects to be discharged to:: Private residence Living Arrangements: Spouse/significant other Available Help at Discharge: Family;Available PRN/intermittently Type of Home: House Home Access: Stairs to enter Entrance Stairs-Rails: None     Home Layout: Multi-level;Able to live on main level with bedroom/bathroom Home Equipment: Rolling Athanas (2 wheels);BSC/3in1;Cane - single point      Prior Function Prior Level of Function : Independent/Modified Independent                     Hand Dominance        Extremity/Trunk Assessment   Upper Extremity Assessment Upper Extremity Assessment: Overall WFL for tasks assessed    Lower Extremity Assessment Lower Extremity Assessment: Generalized weakness    Cervical / Trunk Assessment Cervical / Trunk Assessment: Normal  Communication   Communication: No difficulties  Cognition Arousal/Alertness: Awake/alert Behavior During Therapy: WFL for tasks assessed/performed Overall Cognitive Status: Within Functional Limits for tasks assessed                                          General Comments      Exercises Other Exercises Other Exercises: Role of PT in acute setting, use of AD to reduce falls risk, d/c recs   Assessment/Plan    PT Assessment Patient needs continued PT services  PT Problem List Decreased strength;Pain;Decreased activity tolerance;Decreased balance;Decreased safety awareness       PT Treatment Interventions DME instruction;Therapeutic exercise;Gait training;Balance  training;Stair training;Neuromuscular re-education;Functional mobility training;Therapeutic activities;Patient/family education    PT Goals (Current goals can be found in the Care Plan section)  Acute Rehab PT Goals Patient Stated Goal: improve her pain PT Goal Formulation: With patient Time For Goal Achievement: 12/02/22 Potential to Achieve Goals: Good    Frequency Min 2X/week     Co-evaluation               AM-PAC PT "6 Clicks" Mobility  Outcome Measure Help needed turning from your back to your side while in a flat bed without using bedrails?: A Little Help needed moving from lying on your back to sitting on the side of a flat bed without using bedrails?: A Little Help needed moving to and from a bed to a chair (including a wheelchair)?: A Little Help needed standing up from a chair using your arms (e.g., wheelchair or bedside chair)?: A Little Help needed to walk in hospital room?: A Little Help needed climbing 3-5 steps with a railing? : A Lot 6 Click Score: 17    End of Session Equipment Utilized During Treatment: Gait belt Activity Tolerance: Patient limited by pain Patient left: in bed;with call bell/phone within reach;with bed alarm set;with nursing/sitter in room;with family/visitor present Nurse Communication: Mobility status PT Visit Diagnosis: Unsteadiness on feet (R26.81);Pain;Difficulty in  walking, not elsewhere classified (R26.2);Muscle weakness (generalized) (M62.81) Pain - part of body:  (abdomen)    Time: [id number] PT Time Calculation (min) (ACUTE ONLY): 19 min   Charges:   PT Evaluation $PT Eval Moderate Complexity: Sharp M. Fairly IV, PT, DPT Physical Therapist- [facility]  11/18/2022, 1:09 PM

## 2022-11-19 DIAGNOSIS — Z7189 Other specified counseling: Secondary | ICD-10-CM

## 2022-11-19 LAB — BASIC METABOLIC PANEL
Anion gap: 6 (ref 5–15)
BUN: 5 mg/dL — ABNORMAL LOW (ref 6–20)
CO2: 26 mmol/L (ref 22–32)
Calcium: 8.5 mg/dL — ABNORMAL LOW (ref 8.9–10.3)
Chloride: 100 mmol/L (ref 98–111)
Creatinine, Ser: 0.39 mg/dL — ABNORMAL LOW (ref 0.44–1.00)
GFR, Estimated: >60 mL/min (ref >60)
Glucose, Bld: 104 mg/dL — ABNORMAL HIGH (ref 70–99)
Potassium: 3.9 mmol/L (ref 3.5–5.1)
Sodium: 132 mmol/L — ABNORMAL LOW (ref 135–145)

## 2022-11-19 LAB — CBC
HCT: 27.2 % — ABNORMAL LOW (ref 36.0–46.0)
Hemoglobin: 8.7 g/dL — ABNORMAL LOW (ref 12.0–15.0)
MCH: 28 pg (ref 26.0–34.0)
MCHC: 32 g/dL (ref 30.0–36.0)
MCV: 87.5 fL (ref 80.0–100.0)
Platelets: 446 10*3/uL — ABNORMAL HIGH (ref 150–400)
RBC: 3.11 MIL/uL — ABNORMAL LOW (ref 3.87–5.11)
RDW: 13.8 % (ref 11.5–15.5)
WBC: 8.8 10*3/uL (ref 4.0–10.5)
nRBC: 0 % (ref 0.0–0.2)

## 2022-11-19 LAB — PHOSPHORUS: Phosphorus: 3.8 mg/dL (ref 2.5–4.6)

## 2022-11-19 LAB — MAGNESIUM: Magnesium: 1.9 mg/dL (ref 1.7–2.4)

## 2022-11-19 LAB — LACTIC ACID, PLASMA
Lactic Acid, Venous: 0.7 mmol/L (ref 0.5–1.9)
Lactic Acid, Venous: 0.7 mmol/L (ref 0.5–1.9)

## 2022-11-19 LAB — CEA: CEA: 1.9 ng/mL (ref 0.0–4.7)

## 2022-11-19 MED ORDER — BISACODYL 5 MG PO TBEC: 10.0000 mg | DELAYED_RELEASE_TABLET | Freq: Once | ORAL | Status: DC

## 2022-11-19 MED ORDER — LACTATED RINGERS IV SOLN: INTRAVENOUS | Status: DC

## 2022-11-19 MED ORDER — HYDROMORPHONE HCL 1 MG/ML IJ SOLN
0.5000 mg | INTRAMUSCULAR | Status: DC | PRN
  Administered 2022-11-19 (×2): 0.5 mg via INTRAVENOUS
  Filled 2022-11-19 (×2): qty 0.5

## 2022-11-19 MED ORDER — HYDROMORPHONE HCL 1 MG/ML IJ SOLN
1.0000 mg | INTRAMUSCULAR | Status: DC | PRN
  Administered 2022-11-19 – 2022-11-20 (×8): 1 mg via INTRAVENOUS
  Filled 2022-11-19 (×8): qty 1

## 2022-11-19 MED ORDER — BISACODYL 10 MG RE SUPP
10.0000 mg | Freq: Once | RECTAL | Status: AC
  Administered 2022-11-19: 10 mg via RECTAL
  Filled 2022-11-19: qty 1

## 2022-11-19 MED ORDER — HYDROMORPHONE HCL 1 MG/ML IJ SOLN
0.5000 mg | INTRAMUSCULAR | Status: DC | PRN
  Administered 2022-11-19: 0.5 mg via INTRAVENOUS

## 2022-11-19 MED ORDER — SUCRALFATE 1 GM/10ML PO SUSP
1.0000 g | Freq: Three times a day (TID) | ORAL | Status: DC
  Administered 2022-11-19 – 2022-11-24 (×22): 1 g via ORAL
  Filled 2022-11-19 (×21): qty 10

## 2022-11-19 NOTE — Progress Notes (Addendum)
Daily Progress Note   Patient Name: Erika Downs       Date: 11/19/2022 DOB: 11-09-1960  Age: 62 y.o. MRN#: [id number] Attending Physician: Elmarie Shiley, MD Primary Care Physician: Pcp, No Admit Date: 11/14/2022  Reason for Consultation/Follow-up: Establishing goals of care  Subjective: Notes and labs reviewed. In to see patient. She is sitting on side of bed complaining of severe abdominal pain. She verbalizes updates and that surgery service is being engaged. She states she is amenable to surgery and understands she would need ventilator support for anesthesia if operative management is pursed. She states she would be amenable to ventilator support for up to 1 week after surgery if she was not able to be immediately weaned post op.    Nurse in to provide pain medication. Patient requests to complete HPOA forms for her significant other to be her primary HPOA, and her best friend to be secondary HPOA. Document was provided to patient and reviewed; questions answered. Will place order for chaplain to assist with completion.    Length of Stay: 5  Current Medications: Scheduled Meds:   feeding supplement  237 mL Oral TID BM   folic acid  1 mg Intravenous Daily   magnesium oxide  200 mg Oral BID   multivitamin with minerals  1 tablet Oral Daily   nicotine  14 mg Transdermal Daily   pantoprazole (PROTONIX) IV  40 mg Intravenous Q12H   sodium chloride flush  3 mL Intravenous Q12H   sucralfate  1 g Oral TID WC & HS   thiamine (VITAMIN B1) injection  100 mg Intravenous Daily    Continuous Infusions:  lactated ringers 1,000 mL with potassium chloride 20 mEq infusion 75 mL/hr at 11/19/22 1209    PRN Meds: acetaminophen, HYDROmorphone (DILAUDID) injection, ondansetron **OR**  ondansetron (ZOFRAN) IV, mouth rinse, oxyCODONE  Physical Exam Pulmonary:     Effort: Pulmonary effort is normal.  Neurological:     Mental Status: She is alert.             Vital Signs: BP 121/80 (BP Location: Left Arm)   Pulse 83   Temp 98.3 F (36.8 C) (Oral)   Resp 19   Ht 5' 2"$  (1.575 m)   Wt 59.5 kg   SpO2 95%   BMI 23.99 kg/m  SpO2: SpO2: 95 % O2 Device: O2 Device: Room Air O2 Flow Rate: O2 Flow Rate (L/min): 2 L/min  Intake/output summary:  Intake/Output Summary (Last 24 hours) at 11/19/2022 1412 Last data filed at 11/19/2022 1014 Gross per 24 hour  Intake 3207.72 ml  Output 4750 ml  Net -1542.28 ml   LBM: Last BM Date : 11/14/22 Baseline Weight: Weight: 44 kg Most recent weight: Weight: 59.5 kg       Patient Active Problem List   Diagnosis Date Noted   Hypokalemia 11/17/2022   Protein-calorie malnutrition, severe 11/16/2022   Acute upper GI bleed 11/14/2022   Unintentional weight loss 11/14/2022   Hyperlipidemia 11/14/2022   Acute blood loss anemia 11/14/2022   Tobacco use 12/14/2016   Chest pain 09/25/2011    Palliative Care Assessment & Plan    Recommendations/Plan:  PRN Dilaudid increased to 38m q2hrs  PRN for severe pain. Agree with Oxycodone for moderate pain.  Patient is amenable to surgery if needed, and understands ventilator support would be needed for anesthesia. She states she would be amenable to ventilator support for up to 1 week after surgery if she was not able to be immediately weaned post op. Outside of this circumstance, she would otherwise wish to be a DNI.   Chaplain consult placed for completion of AD.     Code Status:    Code Status Orders  (From admission, onward)           Start     Ordered   11/14/22 1817  Do not attempt resuscitation (DNR)  Continuous       Question Answer Comment  If patient has no pulse and is not breathing Do Not Attempt Resuscitation   If patient has a pulse and/or is breathing:  Medical Treatment Goals MEDICAL INTERVENTIONS DESIRED: Use advanced airway interventions, mechanical ventilation or cardioversion in appropriate circumstances; Use medication/IV fluids as indicated; Provide comfort medications; Transfer to Progressive/Stepdown/ICU as indicated.   Consent: Discussion documented in EHR or advanced directives reviewed      11/14/22 1818           Code Status History     This patient has a current code status but no historical code status.         Care plan was discussed with attending via epic chat  Thank you for allowing the Palliative Medicine Team to assist in the care of this patient.  CAsencion Gowda NP  Please contact Palliative Medicine Team phone at [phone] questions and concerns.

## 2022-11-19 NOTE — Progress Notes (Signed)
   11/19/22 1600  Spiritual Encounters  Type of Visit Initial  Care provided to: Patient  Conversation partners present during encounter Nurse  Referral source Patient request  Reason for visit Advance directives  OnCall Visit Yes  Spiritual Framework  Presenting Themes Goals in life/care;Impactful experiences and emotions  Interventions  Spiritual Care Interventions Made Mindfulness intervention;Decision-making support/facilitation;Established relationship of care and support  Intervention Outcomes  Outcomes Reduced fear;Autonomy/agency  Spiritual Care Plan  Spiritual Care Issues Still Outstanding No further spiritual care needs at this time (see row info)  Advance Directives (For Healthcare)  Does Patient Have a Medical Advance Directive? Yes   Chap visited the patient in response to Nurse page. Pt in the room wanted facilitation to fill the Advance Directive. Melven Sartorius gladly and quickly organized for 2 witnesses and a Notary.

## 2022-11-19 NOTE — TOC Initial Note (Signed)
Transition of Care [facility]) - Initial/Assessment Note    Patient Details  Name: Erika Downs MRN: [id number] Date of Birth: [birth year]  [facility] CM/SW Contact:    Beverly Sessions, RN Phone Number: 11/19/2022, 1:32 PM  Clinical Narrative:                  Admitted for: GI bleed Admitted from: home with significant other PCP: No PCP.  Offered patient resources for [employer] .  Patient declined  Current home health/prior home health/DME: RW, cane  Therapy recommending home health.  Patient declines states she knows how to do it on her own, and does not want anyone coming in her home  TOC to follow for discharge medications    Expected Discharge Plan: Home/Self Care Barriers to Discharge: Continued Medical Work up   Patient Goals and CMS Choice Patient states their goals for this hospitalization and ongoing recovery are:: to return hom          Expected Discharge Plan and Services                                              Prior Living Arrangements/Services   Lives with:: Significant Other Patient language and need for interpreter reviewed:: Yes        Need for Family Participation in Patient Care: No (Comment) Care giver support system in place?: Yes (comment) Current home services: DME Criminal Activity/Legal Involvement Pertinent to Current Situation/Hospitalization: No - Comment as needed  Activities of Daily Living Home Assistive Devices/Equipment: Eyeglasses, Dentures (specify type) ADL Screening (condition at time of admission) Patient's cognitive ability adequate to safely complete daily activities?: Yes Is the patient deaf or have difficulty hearing?: No Does the patient have difficulty seeing, even when wearing glasses/contacts?: No Does the patient have difficulty concentrating, remembering, or making decisions?: No Patient able to express need for assistance with ADLs?: Yes Does the patient have difficulty dressing  or bathing?: No Independently performs ADLs?: Yes (appropriate for developmental age) Does the patient have difficulty walking or climbing stairs?: No Weakness of Legs: Both Weakness of Arms/Hands: None  Permission Sought/Granted                  Emotional Assessment              Admission diagnosis:  Acute upper GI bleed [K92.2] UGIB (upper gastrointestinal bleed) [K92.2] Patient Active Problem List   Diagnosis Date Noted   Hypokalemia 11/17/2022   Protein-calorie malnutrition, severe 11/16/2022   Acute upper GI bleed 11/14/2022   Unintentional weight loss 11/14/2022   Hyperlipidemia 11/14/2022   Acute blood loss anemia 11/14/2022   Tobacco use 12/14/2016   Chest pain 09/25/2011   PCP:  Merryl Hacker, No Pharmacy:   [facility] [address] [place] [address] Phone: [phone] Fax: [facility], [place] [address] [place] [address] Phone: [phone] Fax: [phone]     Social Determinants of Health (SDOH) Social History: SDOH Screenings   Food Insecurity: No Food Insecurity (11/16/2022)  Housing: Low Risk  (11/16/2022)  Transportation Needs: No Transportation Needs (11/16/2022)  Utilities: Not At Risk (11/16/2022)  Tobacco Use: High Risk (11/16/2022)   SDOH  Interventions:     Readmission Risk Interventions     No data to display

## 2022-11-19 NOTE — Progress Notes (Signed)
[facility] Inpatient Progress Note    Subjective: Patient seen for f/u abdominal pain, ileus. Patient still with moderate to sever abdominal pain. CT abd/pelvis - [place] Radiologist still with high suspicion of gastric malignancy. EGD showed H Pylori positive gastritis and inflammatory tissue, no malignancy. Patient passing flatus, still no bm. CT showing dilated colon to 7cm, possible ischemia in watershed area. Patient not having any ischemic coltiis symptoms at present.  Objective: Vital signs in last 24 hours: Temp:  [97.5 F (36.4 C)-98.3 F (36.8 C)] 98.3 F (36.8 C) (02/22 0426) Pulse Rate:  [79-88] 83 (02/22 0934) Resp:  [18-19] 19 (02/22 0426) BP: (120-157)/(64-84) 121/80 (02/22 0934) SpO2:  [93 %-97 %] 95 % (02/22 0934) Weight:  [59.5 kg] 59.5 kg (02/22 0500) Blood pressure 121/80, pulse 83, temperature 98.3 F (36.8 C), temperature source Oral, resp. rate 19, height 5' 2"$  (1.575 m), weight 59.5 kg, SpO2 95 %.    Intake/Output from previous day: 02/21 0701 - 02/22 0700 In: 3147.7 [P.O.:1100; I.V.:1480; IV Piggyback:567.8] Out: 4600 [Urine:4600]  Intake/Output this shift: Total I/O In: 240 [P.O.:240] Out: -    Gen: NAD. Appears comfortable.  HEENT: [place]/AT. PERRLA. Normal external ear exam.  Chest: CTA, no wheezes.  CV: RR nl S1, S2. No gallops.  Abd: soft, diffusely tender, no rebound, moderately distended, BS+  Ext: no edema. Pulses 2+  Neuro: Alert and oriented. Judgement appears normal. Nonfocal.   Lab Results: Results for orders placed or performed during the hospital encounter of 11/14/22 (from the past 24 hour(s))  Phosphorus     Status: None   Collection Time: 11/19/22  4:31 AM  Result Value Ref Range   Phosphorus 3.8 2.5 - 4.6 mg/dL  Magnesium     Status: None   Collection Time: 11/19/22  4:31 AM  Result Value Ref Range   Magnesium 1.9 1.7 - 2.4 mg/dL  Basic metabolic panel     Status: Abnormal   Collection Time:  11/19/22  4:31 AM  Result Value Ref Range   Sodium 132 (L) 135 - 145 mmol/L   Potassium 3.9 3.5 - 5.1 mmol/L   Chloride 100 98 - 111 mmol/L   CO2 26 22 - 32 mmol/L   Glucose, Bld 104 (H) 70 - 99 mg/dL   BUN 5 (L) 6 - 20 mg/dL   Creatinine, Ser 0.39 (L) 0.44 - 1.00 mg/dL   Calcium 8.5 (L) 8.9 - 10.3 mg/dL   GFR, Estimated >60 >60 mL/min   Anion gap 6 5 - 15  CBC     Status: Abnormal   Collection Time: 11/19/22  9:43 AM  Result Value Ref Range   WBC 8.8 4.0 - 10.5 K/uL   RBC 3.11 (L) 3.87 - 5.11 MIL/uL   Hemoglobin 8.7 (L) 12.0 - 15.0 g/dL   HCT 27.2 (L) 36.0 - 46.0 %   MCV 87.5 80.0 - 100.0 fL   MCH 28.0 26.0 - 34.0 pg   MCHC 32.0 30.0 - 36.0 g/dL   RDW 13.8 11.5 - 15.5 %   Platelets 446 (H) 150 - 400 K/uL   nRBC 0.0 0.0 - 0.2 %  Lactic acid, plasma     Status: None   Collection Time: 11/19/22  9:43 AM  Result Value Ref Range   Lactic Acid, Venous 0.7 0.5 - 1.9 mmol/L     Recent Labs    11/17/22 0249 11/18/22 0505 11/19/22 0943  WBC 9.9 8.2 8.8  HGB 7.5* 8.8* 8.7*  HCT 23.7* 27.2*  27.2*  PLT 278 386 446*   BMET Recent Labs    11/17/22 0249 11/18/22 0505 11/19/22 0431  NA 132* 131* 132*  K 2.8* 3.2* 3.9  CL 99 95* 100  CO2 25 26 26  $ GLUCOSE 98 94 104*  BUN 8 5* 5*  CREATININE 0.37* 0.38* 0.39*  CALCIUM 7.8* 8.2* 8.5*   LFT No results for input(s): "PROT", "ALBUMIN", "AST", "ALT", "ALKPHOS", "BILITOT", "BILIDIR", "IBILI" in the last 72 hours. PT/INR No results for input(s): "LABPROT", "INR" in the last 72 hours. Hepatitis Panel No results for input(s): "HEPBSAG", "HCVAB", "HEPAIGM", "HEPBIGM" in the last 72 hours. C-Diff No results for input(s): "CDIFFTOX" in the last 72 hours. No results for input(s): "CDIFFPCR" in the last 72 hours.   Studies/Results: CT ABDOMEN PELVIS WO CONTRAST  Result Date: 11/18/2022 CLINICAL DATA:  61 year old female presents for evaluation of abdominal pain and worsening colonic distension. EXAM: CT ABDOMEN AND PELVIS  WITHOUT CONTRAST TECHNIQUE: Multidetector CT imaging of the abdomen and pelvis was performed following the standard protocol without IV contrast. RADIATION DOSE REDUCTION: This exam was performed according to the departmental dose-optimization program which includes automated exposure control, adjustment of the mA and/or kV according to patient size and/or use of iterative reconstruction technique. COMPARISON:  November 14, 2022. FINDINGS: Lower chest: Small effusions bilaterally largest on the RIGHT with RIGHT basilar consolidation. Granuloma in the lingula. Hepatobiliary: Liver with smooth contours. No focal, suspicious lesion on noncontrast imaging with limited evaluation. No pericholecystic stranding. No gross biliary duct distension. Pancreas: Pancreas with normal contours, no signs of inflammation or peripancreatic fluid. Spleen: Normal. Adrenals/Urinary Tract: Adrenal glands are normal. Smooth contour the bilateral kidneys. No hydronephrosis. No perinephric stranding. No perivesical stranding. Ureters difficult to assess but are nondilated. Stomach/Bowel: No extravasation of oral contrast media beyond the confines of the stomach with large irregular ulcer crater extending beyond the expected gastric lumen or distorting the normal gastric lumen. This appears undermined and has AA component of intramural extension best seen on image 36/5 margins are irregular and heat. There is perigastric infiltration. Gastrohepatic lymph nodes with mild enlargement (image 19/2) 11 mm lymph node in the gastrohepatic ligament. Perigastric nodules are present along the greater curvature of the stomach. Small nodule present in the LEFT upper quadrant measures 6 mm (image 42/5). Nodule adjacent to the ulcer measuring 16 mm (image 37/5) Small bowel without signs of obstruction. Oral contrast passes into the distal small bowel just upstream from the terminal ileum. Appendix not visible but no secondary signs to suggest acute  appendicitis. Colon is in fact dilated with dilation of the transverse colon up to 7 cm. At the level of the hepatic flexure and distal transverse colon there is colonic thickening most pronounced in the distal transverse colon just proximal to the splenic flexure with surrounding stranding (image 42/5) the colon was decompressed previously but perhaps thickened along the descending portion of the colon which is not evident currently there is stranding extending from the gastric ulcer but substantial stranding extending to the distal transverse colon beyond what is seen extending towards other structures in the upper abdomen. Vascular/Lymphatic: Gastrohepatic nodes with slight enlargement. 3.2 cm infrarenal abdominal aortic aneurysm without change. No pelvic sidewall lymphadenopathy. Reproductive: Unremarkable to the extent evaluated. Other: No pneumoperitoneum or extravasation of contrast. Subtle nodularity in the transverse mesocolon and about the lateral aspect of the stomach in LEFT upper quadrant is of uncertain significance. Musculoskeletal: No acute bone finding. No destructive bone process. Spinal degenerative changes. IMPRESSION:  1. Large irregular ulcer crater extends slightly beyond the confines of the expected wall of the stomach likely due to distortion of the stomach and shows no change compared to more remote imaging in terms of its morphology. This shows perigastric stranding and no extraluminal contrast media with suspected intramural component undermining the more distal stomach. Location and appearance remains suspicious for malignant ulcer of the stomach. Continued correlation may be helpful. 2. Segmental thickening of the colon with surrounding stranding in a distribution that could be seen in the setting of ischemic colitis, difficult to exclude colonic neoplasm. Correlate with any history of hypotension that would explain watershed injury. Decreased perfusion due to infiltration of the  transverse mesocolon from inflammation along the greater curvature of the stomach is another differential possibility. Lactate correlation may be helpful. Surgical and GI consultation may be helpful to guide further management. Follow-up endoscopic assessment could also be of benefit when the patient is able. 3. This thickened segment of the colon leads to upstream colonic distension up to 7 cm compatible with partial colonic obstruction or functional colonic obstruction. Close follow-up for this finding is suggested. 4. Areas of peritoneal nodularity may be related to underlying inflammation. However, suggest at least close follow-up if not repeat assessment for neoplasm. 5. New small bilateral pleural effusions RIGHT greater than LEFT with RIGHT basilar airspace disease. 6. 3.2 cm infrarenal abdominal aortic aneurysm without change. Recommend follow-up every 3 years. Electronically Signed   By: Zetta Bills M.D.   On: 11/18/2022 18:45   DG Abd 1 View  Result Date: 11/18/2022 CLINICAL DATA:  Abdominal pain. EXAM: ABDOMEN - 1 VIEW COMPARISON:  CT evaluation from November 14, 2022. FINDINGS: Colonic dilation is increased since previous imaging with ahaustral appearance of the colon. Colonic distension reaches up to 8.5 cm in the midline upper abdomen with the transverse colon is quite distended. No signs of rectal gas. Soft tissues are grossly unremarkable. On limited assessment no acute skeletal process. EKG leads project over the abdomen. IMPRESSION: 1. Increasing colonic dilation with ahaustral appearance of the colon. Findings may reflect developing colonic obstruction in the setting of colitis or ileus related to potential local inflammation incited by ulcer disease described on previous imaging. Close follow-up and GI consultation may be warranted. Stomach not well assessed on current imaging. If there is worsening pain could consider CT with oral contrast media as warranted. 2. No signs of rectal gas.  Electronically Signed   By: Zetta Bills M.D.   On: 11/18/2022 11:07   DG Chest Port 1 View  Result Date: 11/18/2022 CLINICAL DATA:  A 62 year old female presents with abdominal pain. EXAM: PORTABLE CHEST 1 VIEW COMPARISON:  November 14, 2022. FINDINGS: EKG leads project over the chest. Cardiomediastinal contours and hilar structures are stable. Subtle RIGHT basilar interstitial and airspace disease and graded opacity. Granuloma in the LEFT lower lobe. No lobar consolidation or pneumothorax. On limited assessment no acute skeletal process with signs of prior injury to the RIGHT clavicle. IMPRESSION: Subtle RIGHT basilar interstitial and airspace disease and graded opacity. Findings could reflect small effusion and basilar airspace process perhaps atelectasis. Correlate with any signs of infection and suggest attention on follow-up. Electronically Signed   By: Zetta Bills M.D.   On: 11/18/2022 11:02    Scheduled Inpatient Medications:    feeding supplement  237 mL Oral TID BM   folic acid  1 mg Intravenous Daily   magnesium oxide  200 mg Oral BID   multivitamin with minerals  1 tablet Oral Daily   nicotine  14 mg Transdermal Daily   pantoprazole (PROTONIX) IV  40 mg Intravenous Q12H   sodium chloride flush  3 mL Intravenous Q12H   sucralfate  1 g Oral TID WC & HS   thiamine (VITAMIN B1) injection  100 mg Intravenous Daily    Continuous Inpatient Infusions:    lactated ringers 1,000 mL with potassium chloride 20 mEq infusion 75 mL/hr at 11/19/22 1209    PRN Inpatient Medications:  acetaminophen, HYDROmorphone (DILAUDID) injection, ondansetron **OR** ondansetron (ZOFRAN) IV, mouth rinse, oxyCODONE  Miscellaneous: N/A  Assessment:  Large, irregular antral ulcer - with perigastric adenopathy, surrounding inflammation extending to colon. Still some suspicion of malignancy despite negative biopsies. H Pylori POSITIVE gastric biopsies. Persistent abdominal pain.  Colonic ileus -  slightly improved. F/E/N -  On liquid diet. Wants to advance but still too early.  Plan:  Continue clears. Serial KUB. Cathartics to assist bowel function. Consider surgical consult to evaluate for surgical resection of ulcer for relief of pain and to obtain definitive pathologic diagnosis of ulcer(s). Following along.  Abubakar Crispo K. Alice Reichert, M.D. 11/19/2022, 1:13 PM

## 2022-11-19 NOTE — Progress Notes (Signed)
Physical Therapy Treatment Patient Details Name: Erika Downs MRN: [id number] DOB: 1960/11/19 Today's Date: 11/19/2022   History of Present Illness Erika Downs states that she has been experiencing nausea with vomiting that began suddenly approximately 3 days ago.  Shortly thereafter, she developed diffuse abdominal pain that is worst in the epigastric region.  Then last night, her emesis turned bright red and has been bloody ever since.    PT Comments    Pt received supine in bed agreeable to PT. Pt motivated as she wants to go home. She is mod-I with bed mobility performing log roll technique and stands with supervision without AD. Pt appearing in better spirits today stating she is better rested and pain is more manageable today. Pt appearing stronger as she does not rely on Ue's on surfaces for steadying as she completes 2 laps around nurses station at supervision level with small, but equal step lengths. Pt reports being at her baseline with gait and mobility. Will maintain on PT caseload to assess steps next session.  Pt returning to supine with all needs in reach. VSS throughout mobility with minor SOB at end of bout likely due to general deconditioning. Continue to rec HHPT at discharge.    Recommendations for follow up therapy are one component of a multi-disciplinary discharge planning process, led by the attending physician.  Recommendations may be updated based on patient status, additional functional criteria and insurance authorization.  Follow Up Recommendations  Home health PT     Assistance Recommended at Discharge Frequent or constant Supervision/Assistance  Patient can return home with the following A little help with walking and/or transfers;A little help with bathing/dressing/bathroom;Assistance with cooking/housework;Assist for transportation;Help with stairs or ramp for entrance   Equipment Recommendations  None recommended by PT    Recommendations for Other Services        Precautions / Restrictions Precautions Precautions: Fall Restrictions Weight Bearing Restrictions: No     Mobility  Bed Mobility Overal bed mobility: Modified Independent Bed Mobility: Supine to Sit, Sit to Supine     Supine to sit: Modified independent (Device/Increase time) Sit to supine: Modified independent (Device/Increase time)   General bed mobility comments: understanding of log roll technique to assist in pain management Patient Response: Cooperative  Transfers Overall transfer level: Needs assistance   Transfers: Sit to/from Stand Sit to Stand: Supervision                Ambulation/Gait Ambulation/Gait assistance: Supervision Gait Distance (Feet): 340 Feet Assistive device: None Gait Pattern/deviations: Step-through pattern, Decreased step length - right, Decreased step length - left, Decreased dorsiflexion - right, Decreased dorsiflexion - left, Narrow base of support       General Gait Details: pt improved stability, does not need UE's on items/walls to steady herself. Generally small, close to shuffling steps and narrowed BOS but reports this is her baseline gait.   Stairs             Wheelchair Mobility    Modified Rankin (Stroke Patients Only)       Balance Overall balance assessment: Needs assistance Sitting-balance support: Bilateral upper extremity supported, Feet supported Sitting balance-Leahy Scale: Fair     Standing balance support: No upper extremity supported Standing balance-Leahy Scale: Fair Standing balance comment: can maintain standing without UE support                            Cognition Arousal/Alertness: Awake/alert Behavior During Therapy:  WFL for tasks assessed/performed Overall Cognitive Status: Within Functional Limits for tasks assessed                                 General Comments: motivated        Exercises      General Comments General comments (skin  integrity, edema, etc.): HR and SPO2 WNL throughout mobility      Pertinent Vitals/Pain Pain Assessment Pain Assessment: Faces Faces Pain Scale: Hurts little more Pain Location: abdomen Pain Descriptors / Indicators: Aching, Discomfort, Grimacing Pain Intervention(s): Limited activity within patient's tolerance, Monitored during session, Repositioned    Home Living                          Prior Function            PT Goals (current goals can now be found in the care plan section) Acute Rehab PT Goals Patient Stated Goal: improve her pain PT Goal Formulation: With patient Time For Goal Achievement: 12/02/22 Potential to Achieve Goals: Good Progress towards PT goals: Progressing toward goals    Frequency    Min 2X/week      PT Plan Current plan remains appropriate    Co-evaluation              AM-PAC PT "6 Clicks" Mobility   Outcome Measure  Help needed turning from your back to your side while in a flat bed without using bedrails?: None Help needed moving from lying on your back to sitting on the side of a flat bed without using bedrails?: A Little Help needed moving to and from a bed to a chair (including a wheelchair)?: A Little Help needed standing up from a chair using your arms (e.g., wheelchair or bedside chair)?: A Little Help needed to walk in hospital room?: A Little Help needed climbing 3-5 steps with a railing? : A Little 6 Click Score: 19    End of Session Equipment Utilized During Treatment: Gait belt Activity Tolerance: Patient tolerated treatment well Patient left: in bed;with call bell/phone within reach;with bed alarm set;with family/visitor present Nurse Communication: Mobility status PT Visit Diagnosis: Unsteadiness on feet (R26.81);Pain;Difficulty in walking, not elsewhere classified (R26.2);Muscle weakness (generalized) (M62.81) Pain - part of body:  (abdomen)     Time: 1315-1330 PT Time Calculation (min) (ACUTE ONLY):  15 min  Charges:  $Therapeutic Exercise: 8-22 mins                     Kie Calvin M. Fairly IV, PT, DPT Physical Therapist- [facility]  11/19/2022, 1:43 PM

## 2022-11-19 NOTE — Progress Notes (Signed)
Erika Downs for Electrolyte Monitoring and Replacement   Recent Labs: Potassium (mmol/L)  Date Value  11/19/2022 3.9  09/12/2012 3.7   Magnesium (mg/dL)  Date Value  11/19/2022 1.9   Calcium (mg/dL)  Date Value  11/19/2022 8.5 (L)   Calcium, Total (mg/dL)  Date Value  09/12/2012 9.2   Albumin (g/dL)  Date Value  11/14/2022 2.8 (L)  09/12/2012 3.9   Phosphorus (mg/dL)  Date Value  11/19/2022 3.8   Sodium (mmol/L)  Date Value  11/19/2022 132 (L)  09/12/2012 141     Assessment: 62 y/o female with h/o bladder prolapse s/p sling surgery, hiatal hernia and HLD who is admitted with GIB. Pharmacy is asked to follow and replace electrolytes.   MIVF: lactated ringers w/ 20 mEq Kcl per liter infusion at 100 mL/hr  Goal of Therapy:  electrolytes within normal limits  Plan:  No replacement needed at this time. Recheck electrolytes in am  Lorin Picket ,PharmD Clinical Pharmacist 11/19/2022 6:30 AM

## 2022-11-19 NOTE — Progress Notes (Signed)
PROGRESS NOTE    Erika Downs  [id number] DOB: [birth year] DOA: 11/14/2022 PCP: Pcp, No   Brief Narrative: 62 year old with past medical history significant for hyperlipidemia who presents to the ED with abdominal pain and hematemesis.  Admitted with acute blood loss anemia in the setting of upper GI bleed.  Underwent endoscopy through 2/19 showed 2 large also nonbleeding heaped edges  at the ulcer concerning for malignancy.  Biopsy negative for malignancy, Positive H pylori.      Assessment & Plan:   Principal Problem:   Acute upper GI bleed Active Problems:   Acute blood loss anemia   Unintentional weight loss   Tobacco use   Protein-calorie malnutrition, severe   Hypokalemia  1-Acute upper GI bleed, 2 large gastric ulcer Concern for Malignancy, even though Bx negative.  -CT with evidence of progressive gastric antral ulcer with mobile thickening and adjacent fat stranding. -2/18 showed large stomach ulcer in the antrum 3 x 3 cm with heaped up edges concerning but not diagnostic of cancer.  Biopsy pending.  Large clot and ulcer base not removed.  Another large ulcerated 2 cm  near bottom of the stomach near the pyloric channel. -Had severe abdominal pain, change PPI back to IV. -KUB: Increasing colonic dilation with ahaustral appearance of the colon. Findings may reflect developing colonic obstruction in the setting of colitis or ileus related to potential local inflammation incited by ulcer disease described on previous imaging.  -Surgery consulted for evaluation due to concern for malignant gastric Ulcer, and to obtain definitive pathologic diagnosis.  -Start Carafate. On IV PPI  Colonic Ileus:  CT abdomen pelvis: large irregular ulcer. Segmental thickening colon, differentia, ischemic colitis, difficult to exclude colonic neoplasm, colonic dilation, partial colonic obstruction vs Functional colonic obstruction.  -continue clear liquid diet.    H Pillory positive.   Started on Clarithromycin and flagyl. Hold for now not tolerating oral well.    Acute Blood loss anemia;  In setting GI bleed.  Received one unit PRBC 2/18 Monitor hb.  She will need IV iron and B 12 supplement out patient.  One dose IV iron ordered. IM B 12   Unintentional weight loss: Patient has had 40 pound weight loss over the last 6 months. The biopsy does not show malignancy  Hypokalemia: Replete orally and IV Hypomagnesemia: replace IV and orally  Protein calorie malnutrition severe ; on nutrition supplement.   Tobacco use nicotine patch ordered counseled.   Nutrition Problem: Severe Malnutrition Etiology: chronic illness    Signs/Symptoms: moderate fat depletion, moderate muscle depletion, percent weight loss Percent weight loss: 25 %       Estimated body mass index is 23.99 kg/m as calculated from the following:   Height as of this encounter: 5' 2"$  (1.575 m).   Weight as of this encounter: 59.5 kg.   DVT prophylaxis: SCD Code Status: DNR Family Communication: Disposition Plan:  Status is: Inpatient Remains inpatient appropriate because: management of gastric ulcer, abdominal pain     Consultants:  Dr  Alice Reichert, GI  Procedures:  Endoscopy   Antimicrobials:    Subjective: She feels better this am. Report pain better than yesterday. No BM but passing gas.   Objective: Vitals:   11/18/22 2106 11/19/22 0426 11/19/22 0500 11/19/22 0934  BP: (!) 140/76 120/64  121/80  Pulse: 88 79  83  Resp: 18 19    Temp: (!) 97.5 F (36.4 C) 98.3 F (36.8 C)    TempSrc: Oral Oral  SpO2: 96% 93%  95%  Weight:   59.5 kg   Height:        Intake/Output Summary (Last 24 hours) at 11/19/2022 1439 Last data filed at 11/19/2022 1414 Gross per 24 hour  Intake 3387.72 ml  Output 3950 ml  Net -562.28 ml    Filed Weights   11/17/22 0151 11/18/22 1202 11/19/22 0500  Weight: 59.9 kg 59.9 kg 59.5 kg    Examination:  General exam: NAD Respiratory  system: CTA Cardiovascular system: S 1, S 2 RRR Gastrointestinal system: BS present, soft, nt Central nervous system: Alert Extremities: no edema   Data Reviewed: I have personally reviewed following labs and imaging studies  CBC: Recent Labs  Lab 11/15/22 1633 11/16/22 0340 11/17/22 0249 11/18/22 0505 11/19/22 0943  WBC 15.1* 13.9* 9.9 8.2 8.8  HGB 7.0* 7.4* 7.5* 8.8* 8.7*  HCT 22.0* 23.2* 23.7* 27.2* 27.2*  MCV 88.7 88.2 87.5 87.2 87.5  PLT 239 237 278 386 446*    Basic Metabolic Panel: Recent Labs  Lab 11/15/22 0437 11/16/22 0340 11/17/22 0249 11/18/22 0505 11/19/22 0431  NA 135 132* 132* 131* 132*  K 3.5 3.0* 2.8* 3.2* 3.9  CL 103 98 99 95* 100  CO2 27 26 25 26 26  $ GLUCOSE 120* 94 98 94 104*  BUN 29* 12 8 5* 5*  CREATININE 0.50 0.37* 0.37* 0.38* 0.39*  CALCIUM 7.7* 7.7* 7.8* 8.2* 8.5*  MG  --  1.6* 1.8 1.6* 1.9  PHOS  --   --  2.8 3.5 3.8    GFR: Estimated Creatinine Clearance: 59.9 mL/min (A) (by C-G formula based on SCr of 0.39 mg/dL (L)). Liver Function Tests: Recent Labs  Lab 11/14/22 1607  AST 33  ALT 11  ALKPHOS 57  BILITOT 0.5  PROT 6.4*  ALBUMIN 2.8*    Recent Labs  Lab 11/14/22 1607  LIPASE 23    No results for input(s): "AMMONIA" in the last 168 hours. Coagulation Profile: Recent Labs  Lab 11/14/22 1607  INR 1.1    Cardiac Enzymes: No results for input(s): "CKTOTAL", "CKMB", "CKMBINDEX", "TROPONINI" in the last 168 hours. BNP (last 3 results) No results for input(s): "PROBNP" in the last 8760 hours. HbA1C: No results for input(s): "HGBA1C" in the last 72 hours. CBG: Recent Labs  Lab 11/14/22 1831  GLUCAP 110*    Lipid Profile: No results for input(s): "CHOL", "HDL", "LDLCALC", "TRIG", "CHOLHDL", "LDLDIRECT" in the last 72 hours. Thyroid Function Tests: No results for input(s): "TSH", "T4TOTAL", "FREET4", "T3FREE", "THYROIDAB" in the last 72 hours. Anemia Panel: Recent Labs    11/17/22 0249  VITAMINB12 221   FOLATE 20.7  FERRITIN 57  TIBC 249*  IRON 12*    Sepsis Labs: Recent Labs  Lab 11/19/22 0943 11/19/22 1306  LATICACIDVEN 0.7 0.7    Recent Results (from the past 240 hour(s))  MRSA Next Gen by PCR, Nasal     Status: None   Collection Time: 11/14/22  6:39 PM   Specimen: Nasal Mucosa; Nasal Swab  Result Value Ref Range Status   MRSA by PCR Next Gen NOT DETECTED NOT DETECTED Final    Comment: (NOTE) The GeneXpert MRSA Assay (FDA approved for NASAL specimens only), is one component of a comprehensive MRSA colonization surveillance program. It is not intended to diagnose MRSA infection nor to guide or monitor treatment for MRSA infections. Test performance is not FDA approved in patients less than 87 years old. Performed at [facility], [address] [place],  [place] [address]          Radiology Studies: CT ABDOMEN PELVIS WO CONTRAST  Result Date: 11/18/2022 CLINICAL DATA:  62 year old female presents for evaluation of abdominal pain and worsening colonic distension. EXAM: CT ABDOMEN AND PELVIS WITHOUT CONTRAST TECHNIQUE: Multidetector CT imaging of the abdomen and pelvis was performed following the standard protocol without IV contrast. RADIATION DOSE REDUCTION: This exam was performed according to the departmental dose-optimization program which includes automated exposure control, adjustment of the mA and/or kV according to patient size and/or use of iterative reconstruction technique. COMPARISON:  November 14, 2022. FINDINGS: Lower chest: Small effusions bilaterally largest on the RIGHT with RIGHT basilar consolidation. Granuloma in the lingula. Hepatobiliary: Liver with smooth contours. No focal, suspicious lesion on noncontrast imaging with limited evaluation. No pericholecystic stranding. No gross biliary duct distension. Pancreas: Pancreas with normal contours, no signs of inflammation or peripancreatic fluid. Spleen: Normal. Adrenals/Urinary Tract: Adrenal  glands are normal. Smooth contour the bilateral kidneys. No hydronephrosis. No perinephric stranding. No perivesical stranding. Ureters difficult to assess but are nondilated. Stomach/Bowel: No extravasation of oral contrast media beyond the confines of the stomach with large irregular ulcer crater extending beyond the expected gastric lumen or distorting the normal gastric lumen. This appears undermined and has AA component of intramural extension best seen on image 36/5 margins are irregular and heat. There is perigastric infiltration. Gastrohepatic lymph nodes with mild enlargement (image 19/2) 11 mm lymph node in the gastrohepatic ligament. Perigastric nodules are present along the greater curvature of the stomach. Small nodule present in the LEFT upper quadrant measures 6 mm (image 42/5). Nodule adjacent to the ulcer measuring 16 mm (image 37/5) Small bowel without signs of obstruction. Oral contrast passes into the distal small bowel just upstream from the terminal ileum. Appendix not visible but no secondary signs to suggest acute appendicitis. Colon is in fact dilated with dilation of the transverse colon up to 7 cm. At the level of the hepatic flexure and distal transverse colon there is colonic thickening most pronounced in the distal transverse colon just proximal to the splenic flexure with surrounding stranding (image 42/5) the colon was decompressed previously but perhaps thickened along the descending portion of the colon which is not evident currently there is stranding extending from the gastric ulcer but substantial stranding extending to the distal transverse colon beyond what is seen extending towards other structures in the upper abdomen. Vascular/Lymphatic: Gastrohepatic nodes with slight enlargement. 3.2 cm infrarenal abdominal aortic aneurysm without change. No pelvic sidewall lymphadenopathy. Reproductive: Unremarkable to the extent evaluated. Other: No pneumoperitoneum or extravasation of  contrast. Subtle nodularity in the transverse mesocolon and about the lateral aspect of the stomach in LEFT upper quadrant is of uncertain significance. Musculoskeletal: No acute bone finding. No destructive bone process. Spinal degenerative changes. IMPRESSION: 1. Large irregular ulcer crater extends slightly beyond the confines of the expected wall of the stomach likely due to distortion of the stomach and shows no change compared to more remote imaging in terms of its morphology. This shows perigastric stranding and no extraluminal contrast media with suspected intramural component undermining the more distal stomach. Location and appearance remains suspicious for malignant ulcer of the stomach. Continued correlation may be helpful. 2. Segmental thickening of the colon with surrounding stranding in a distribution that could be seen in the setting of ischemic colitis, difficult to exclude colonic neoplasm. Correlate with any history of hypotension that would explain watershed injury. Decreased perfusion due to infiltration of the transverse  mesocolon from inflammation along the greater curvature of the stomach is another differential possibility. Lactate correlation may be helpful. Surgical and GI consultation may be helpful to guide further management. Follow-up endoscopic assessment could also be of benefit when the patient is able. 3. This thickened segment of the colon leads to upstream colonic distension up to 7 cm compatible with partial colonic obstruction or functional colonic obstruction. Close follow-up for this finding is suggested. 4. Areas of peritoneal nodularity may be related to underlying inflammation. However, suggest at least close follow-up if not repeat assessment for neoplasm. 5. New small bilateral pleural effusions RIGHT greater than LEFT with RIGHT basilar airspace disease. 6. 3.2 cm infrarenal abdominal aortic aneurysm without change. Recommend follow-up every 3 years. Electronically  Signed   By: Zetta Bills M.D.   On: 11/18/2022 18:45   DG Abd 1 View  Result Date: 11/18/2022 CLINICAL DATA:  Abdominal pain. EXAM: ABDOMEN - 1 VIEW COMPARISON:  CT evaluation from November 14, 2022. FINDINGS: Colonic dilation is increased since previous imaging with ahaustral appearance of the colon. Colonic distension reaches up to 8.5 cm in the midline upper abdomen with the transverse colon is quite distended. No signs of rectal gas. Soft tissues are grossly unremarkable. On limited assessment no acute skeletal process. EKG leads project over the abdomen. IMPRESSION: 1. Increasing colonic dilation with ahaustral appearance of the colon. Findings may reflect developing colonic obstruction in the setting of colitis or ileus related to potential local inflammation incited by ulcer disease described on previous imaging. Close follow-up and GI consultation may be warranted. Stomach not well assessed on current imaging. If there is worsening pain could consider CT with oral contrast media as warranted. 2. No signs of rectal gas. Electronically Signed   By: Zetta Bills M.D.   On: 11/18/2022 11:07   DG Chest Port 1 View  Result Date: 11/18/2022 CLINICAL DATA:  A 62 year old female presents with abdominal pain. EXAM: PORTABLE CHEST 1 VIEW COMPARISON:  November 14, 2022. FINDINGS: EKG leads project over the chest. Cardiomediastinal contours and hilar structures are stable. Subtle RIGHT basilar interstitial and airspace disease and graded opacity. Granuloma in the LEFT lower lobe. No lobar consolidation or pneumothorax. On limited assessment no acute skeletal process with signs of prior injury to the RIGHT clavicle. IMPRESSION: Subtle RIGHT basilar interstitial and airspace disease and graded opacity. Findings could reflect small effusion and basilar airspace process perhaps atelectasis. Correlate with any signs of infection and suggest attention on follow-up. Electronically Signed   By: Zetta Bills M.D.    On: 11/18/2022 11:02        Scheduled Meds:  feeding supplement  237 mL Oral TID BM   folic acid  1 mg Intravenous Daily   magnesium oxide  200 mg Oral BID   multivitamin with minerals  1 tablet Oral Daily   nicotine  14 mg Transdermal Daily   pantoprazole (PROTONIX) IV  40 mg Intravenous Q12H   sodium chloride flush  3 mL Intravenous Q12H   sucralfate  1 g Oral TID WC & HS   thiamine (VITAMIN B1) injection  100 mg Intravenous Daily   Continuous Infusions:  lactated ringers 1,000 mL with potassium chloride 20 mEq infusion 75 mL/hr at 11/19/22 1209     LOS: 5 days    Time spent: 35 minutes    Evy Lutterman A Maricella Filyaw, MD [facility]   If 7PM-7AM, please contact night-coverage [email or web address]  11/19/2022, 2:39 PM

## 2022-11-20 ENCOUNTER — Inpatient Hospital Stay: Payer: Medicaid Other

## 2022-11-20 DIAGNOSIS — K259 Gastric ulcer, unspecified as acute or chronic, without hemorrhage or perforation: Secondary | ICD-10-CM

## 2022-11-20 DIAGNOSIS — B9681 Helicobacter pylori [H. pylori] as the cause of diseases classified elsewhere: Secondary | ICD-10-CM

## 2022-11-20 LAB — BASIC METABOLIC PANEL
Anion gap: 10 (ref 5–15)
BUN: 8 mg/dL (ref 6–20)
CO2: 25 mmol/L (ref 22–32)
Calcium: 8.5 mg/dL — ABNORMAL LOW (ref 8.9–10.3)
Chloride: 98 mmol/L (ref 98–111)
Creatinine, Ser: 0.41 mg/dL — ABNORMAL LOW (ref 0.44–1.00)
GFR, Estimated: >60 mL/min (ref >60)
Glucose, Bld: 102 mg/dL — ABNORMAL HIGH (ref 70–99)
Potassium: 3.9 mmol/L (ref 3.5–5.1)
Sodium: 133 mmol/L — ABNORMAL LOW (ref 135–145)

## 2022-11-20 MED ORDER — METRONIDAZOLE 500 MG/100ML IV SOLN
500.0000 mg | Freq: Two times a day (BID) | INTRAVENOUS | Status: DC
  Administered 2022-11-20 – 2022-11-23 (×7): 500 mg via INTRAVENOUS
  Filled 2022-11-20 (×7): qty 100

## 2022-11-20 MED ORDER — HYDROMORPHONE HCL 1 MG/ML IJ SOLN
0.5000 mg | INTRAMUSCULAR | Status: DC | PRN
  Administered 2022-11-20 – 2022-11-21 (×6): 0.5 mg via INTRAVENOUS
  Filled 2022-11-20 (×6): qty 0.5

## 2022-11-20 MED ORDER — CLARITHROMYCIN 500 MG PO TABS
500.0000 mg | ORAL_TABLET | Freq: Two times a day (BID) | ORAL | Status: DC
  Administered 2022-11-20 – 2022-11-24 (×9): 500 mg via ORAL
  Filled 2022-11-20 (×9): qty 1

## 2022-11-20 MED ORDER — BISACODYL 10 MG RE SUPP
10.0000 mg | Freq: Once | RECTAL | Status: AC
  Administered 2022-11-20: 10 mg via RECTAL
  Filled 2022-11-20: qty 1

## 2022-11-20 MED ORDER — PANTOPRAZOLE SODIUM 40 MG IV SOLR
40.0000 mg | Freq: Two times a day (BID) | INTRAVENOUS | Status: DC
  Administered 2022-11-23 – 2022-11-24 (×2): 40 mg via INTRAVENOUS
  Filled 2022-11-20 (×2): qty 10

## 2022-11-20 MED ORDER — PANTOPRAZOLE INFUSION (NEW) - SIMPLE MED
8.0000 mg/h | INTRAVENOUS | Status: AC
  Administered 2022-11-20 – 2022-11-23 (×6): 8 mg/h via INTRAVENOUS
  Filled 2022-11-20 (×6): qty 100

## 2022-11-20 MED ORDER — PANTOPRAZOLE 80MG IVPB - SIMPLE MED
80.0000 mg | Freq: Once | INTRAVENOUS | Status: AC
  Administered 2022-11-20: 80 mg via INTRAVENOUS
  Filled 2022-11-20: qty 100

## 2022-11-20 NOTE — Progress Notes (Signed)
Daily Progress Note   Patient Name: Erika Downs       Date: 11/20/2022 DOB: 1961-05-21  Age: 62 y.o. MRN#: [id number] Attending Physician: Elmarie Shiley, MD Primary Care Physician: Pcp, No Admit Date: 11/14/2022  Reason for Consultation/Follow-up: Establishing goals of care  Subjective: Notes and labs reviewed. In to see patient. Patient is resting in bed.  She request that her visitors leave so that she could speak with me.  We confirmed that her AD packet has been signed notarized, and uploaded to the ACP tab.  The document was completed and marked to follow the document and not to her healthcare power of attorney.  She states that she has a copy for herself.    She shares that she has a daughter, and has a granddaughter which she has raised from birth, and so calls her "mom".  She states that the granddaughter is her son's daughter.  She states that she has no relationship with her son at all.  She discusses that she does not want any information shared with anyone besides her boyfriend Denice Paradise and then secondarily her best friend Glendale Chard, and only wants information shared with them if she is unable to make decisions for herself.   She discusses that she has been updated that she will likely need to transfer to a different facility for surgery.  She states she is amenable to surgery and understands she would need ventilator support for anesthesia if operative management is pursed. She states she would be amenable to ventilator support for up to 1 week after surgery if she was not able to be immediately weaned post op. Outside of this circumstance, she states she would otherwise wish to be a DNI.   Her pain is improved with PRN oxycodone and Dilaudid.  Discussed using the  Oxycodone for pain relief, and Dilaudid as needed for pain that is not relieved by Oxycodone.  No changes to regimen recommended at this time.  Length of Stay: 6  Current Medications: Scheduled Meds:   clarithromycin  500 mg Oral Q12H   feeding supplement  237 mL Oral TID BM   folic acid  1 mg Intravenous Daily   magnesium oxide  200 mg Oral BID   multivitamin with minerals  1 tablet Oral Daily   nicotine  14 mg Transdermal Daily   [START ON 11/23/2022] pantoprazole  40 mg Intravenous Q12H   sodium chloride flush  3 mL Intravenous Q12H   sucralfate  1 g Oral TID WC & HS   thiamine (VITAMIN B1) injection  100 mg Intravenous Daily    Continuous Infusions:  lactated ringers 1,000 mL with potassium chloride 20 mEq infusion 75 mL/hr at 11/20/22 0211   metronidazole     pantoprazole     pantoprazole      PRN Meds: acetaminophen, HYDROmorphone (DILAUDID) injection, ondansetron **OR** ondansetron (ZOFRAN) IV, mouth rinse, oxyCODONE  Physical Exam Pulmonary:     Effort: Pulmonary effort is normal.  Neurological:     Mental Status: She is alert.             Vital Signs: BP (!) 109/94 (BP Location: Left Arm)   Pulse 87   Temp 99.1 F (37.3 C)   Resp 20   Ht '5\' 2"'$  (1.575 m)   Wt 59.6 kg   SpO2 100%   BMI 24.03 kg/m  SpO2: SpO2: 100 % O2 Device: O2 Device: Room Air O2 Flow Rate: O2 Flow Rate (L/min): 2 L/min  Intake/output summary:  Intake/Output Summary (Last 24 hours) at 11/20/2022 1133 Last data filed at 11/20/2022 1025 Gross per 24 hour  Intake 1525 ml  Output 900 ml  Net 625 ml   LBM: Last BM Date : 11/14/22 Baseline Weight: Weight: 44 kg Most recent weight: Weight: 59.6 kg   Patient Active Problem List   Diagnosis Date Noted   Hypokalemia 11/17/2022   Protein-calorie malnutrition, severe 11/16/2022   Acute upper GI bleed 11/14/2022   Unintentional weight loss 11/14/2022   Hyperlipidemia 11/14/2022   Acute blood loss anemia 11/14/2022   Tobacco use  12/14/2016   Chest pain 09/25/2011    Palliative Care Assessment & Plan   Recommendations/Plan: Patient does not want any information shared with anyone unless she is unable to make decisions for herself.  If she is unable to make decisions for herself she would only want information shared with her significant other Denice Paradise, and her best friend Glendale Chard.  She does not want any information shared with any of her family members.  She discusses that she has been updated that she will likely need to transfer to a different facility for surgery.  She states she is amenable to surgery and understands she would need ventilator support for anesthesia if operative management is pursed. She states she would be amenable to ventilator support for up to 1 week after surgery if she was not able to be immediately weaned post op. Outside of this circumstance, she states she would otherwise wish to be a DNI.   Code Status:    Code Status Orders  (From admission, onward)           Start     Ordered   11/14/22 1817  Do not attempt resuscitation (DNR)  Continuous       Question Answer Comment  If patient has no pulse and is not breathing Do Not Attempt Resuscitation   If patient has a pulse and/or is breathing: Medical Treatment Goals MEDICAL INTERVENTIONS DESIRED: Use advanced airway interventions, mechanical ventilation or cardioversion in appropriate circumstances; Use medication/IV fluids as indicated; Provide comfort medications; Transfer to Progressive/Stepdown/ICU as indicated.   Consent: Discussion documented in EHR or advanced directives reviewed      11/14/22 1818           Code Status  History     This patient has a current code status but no historical code status.      Advance Directive Documentation    Flowsheet Row Most Recent Value  Type of Advance Directive Healthcare Power of Attorney  [created 11/19/22]  Pre-existing out of facility DNR order (yellow form or  pink MOST form) --  "MOST" Form in Place? --       Prognosis:  Unable to determine    Care plan was discussed with RN  Thank you for allowing the Palliative Medicine Team to assist in the care of this patient.   Asencion Gowda, NP  Please contact Palliative Medicine Team phone at [phone] for questions and concerns.

## 2022-11-20 NOTE — Consult Note (Signed)
[facility] SURGICAL CONSULTATION NOTE (initial) - [id number]   HISTORY OF PRESENT ILLNESS (HPI):  62 y.o. female presented to [facility] ED initially on 02/17 for evaluation of abdominal pain. At time of presentation, patient had been reporting several days of generalized abdominal pain as well as emesis. She did report blood in her emesis as well. Work up in the ED revealed a Hgb to 9.9 (was previously 14.3) and leukocytosis to 19K. CT Abdomen/Pelvis on admission was concerning for antral ulceration without perforation. She was ultimately admitted to the medicine service. GI was consulted. She underwent EGD on 02/18 with Dr Alice Reichert. This showed a prepyloric ulcer as well as a 3 x 3 cm ulcer in the gastric antrum. This was biopsied x6. Pathology from this did show "ulcerated gastric mucosa, inflammatory exudate and focal fibroplastic reactions in ulcer bed, chronic active gastritis with confirm H pylori, negative for malignancy in biopsied sections." Seems that she has continued to have upper abdominal pain despite management. Her CEA level has been checked multiple times and is normal at 1.9 x2. She did undergo repeat CT Abdomen/Pelvis on 02/21 which stable appearing irregular ulcer although there is irregularity that raises suspicion for possible malignancy. She is currently on CLD; but only tolerating some of this. She is passing flatus but only with minimal BM.   Surgery is consulted by hospitalist physician Dr. Niel Hummer, MD in this context for evaluation and management of persistent abdominal pain in setting of gastric ulcer with concern for possible malignancy despite negative biopsy x6.   PAST MEDICAL HISTORY (PMH):  Past Medical History:  Diagnosis Date   Chest pain    Hyperlipidemia      PAST SURGICAL HISTORY ([place]):  Past Surgical History:  Procedure Laterality Date   BLADDER SUSPENSION     ESOPHAGOGASTRODUODENOSCOPY (EGD) WITH PROPOFOL N/A 11/15/2022   Procedure:  ESOPHAGOGASTRODUODENOSCOPY (EGD) WITH PROPOFOL;  Surgeon: Toledo, Benay Pike, MD;  Location: [facility] ENDOSCOPY;  Service: Gastroenterology;  Laterality: N/A;   NOVASURE ABLATION       MEDICATIONS:  Prior to Admission medications   Not on File     ALLERGIES:  Allergies  Allergen Reactions   Ibuprofen Hives and Swelling   Ivp Dye [Iodinated Contrast Media] Hives    PT states she was injected at [facility] and had hives   Penicillins Hives and Swelling    Has patient had a PCN reaction causing immediate rash, facial/tongue/throat swelling, SOB or lightheadedness with hypotension: Yes Has patient had a PCN reaction causing severe rash involving mucus membranes or skin necrosis: No Has patient had a PCN reaction that required hospitalization No Has patient had a PCN reaction occurring within the last 10 years: No If all of the above answers are "NO", then may proceed with Cephalosporin use.      SOCIAL HISTORY:  Social History   Socioeconomic History   Marital status: Single    Spouse name: Not on file   Number of children: Not on file   Years of education: Not on file   Highest education level: Not on file  Occupational History   Not on file  Tobacco Use   Smoking status: Every Day    Packs/day: 0.50    Types: Cigarettes   Smokeless tobacco: Never  Vaping Use   Vaping Use: Never used  Substance and Sexual Activity   Alcohol use: Not Currently   Drug use: No   Sexual activity: Not on file  Other Topics Concern   Not  on file  Social History Narrative   Not on file   Social Determinants of Health   Financial Resource Strain: Not on file  Food Insecurity: No Food Insecurity (11/16/2022)   Hunger Vital Sign    Worried About Running Out of Food in the Last Year: Never true    Ran Out of Food in the Last Year: Never true  Transportation Needs: No Transportation Needs (11/16/2022)   PRAPARE - Hydrologist (Medical): No    Lack of Transportation  (Non-Medical): No  Physical Activity: Not on file  Stress: Not on file  Social Connections: Not on file  Intimate Partner Violence: Not At Risk (11/16/2022)   Humiliation, Afraid, Rape, and Kick questionnaire    Fear of Current or Ex-Partner: No    Emotionally Abused: No    Physically Abused: No    Sexually Abused: No     FAMILY HISTORY:  History reviewed. No pertinent family history.    REVIEW OF SYSTEMS:  Review of Systems  Constitutional:  Negative for chills and fever.  HENT:  Negative for congestion and sore throat.   Respiratory:  Negative for cough and shortness of breath.   Cardiovascular:  Negative for chest pain and palpitations.  Gastrointestinal:  Positive for abdominal pain. Negative for nausea and vomiting.  Genitourinary:  Negative for dysuria and urgency.  All other systems reviewed and are negative.   VITAL SIGNS:  Temp:  [97.7 F (36.5 C)-99.1 F (37.3 C)] 99.1 F (37.3 C) (02/23 0743) Pulse Rate:  [77-87] 87 (02/23 0743) Resp:  [16-20] 20 (02/23 0743) BP: (109-137)/(69-94) 109/94 (02/23 0743) SpO2:  [94 %-100 %] 100 % (02/23 0743) Weight:  [59.6 kg] 59.6 kg (02/23 0801)     Height: '5\' 2"'$  (157.5 cm) Weight: 59.6 kg BMI (Calculated): 24.03   INTAKE/OUTPUT:  02/22 0701 - 02/23 0700 In: [id number] [P.O.:660; I.V.:925] Out: 1800 [Urine:1800]  PHYSICAL EXAM:  Physical Exam Vitals and nursing note reviewed. Exam conducted with a chaperone present.  Constitutional:      General: She is not in acute distress.    Appearance: She is normal weight. She is not ill-appearing.  HENT:     Head: Normocephalic and atraumatic.  Eyes:     General: No scleral icterus.    Extraocular Movements: Extraocular movements intact.  Cardiovascular:     Rate and Rhythm: Normal rate and regular rhythm.     Heart sounds: Normal heart sounds.  Pulmonary:     Effort: Pulmonary effort is normal. No respiratory distress.  Abdominal:     General: Abdomen is flat. There is no  distension.     Palpations: Abdomen is soft.     Tenderness: There is no abdominal tenderness. There is no guarding or rebound.  Genitourinary:    Comments: Deferred Skin:    General: Skin is warm and dry.     Coloration: Skin is not jaundiced or pale.  Neurological:     General: No focal deficit present.     Mental Status: She is alert and oriented to person, place, and time.  Psychiatric:        Mood and Affect: Mood normal.        Behavior: Behavior normal.      Labs:     Latest Ref Rng & Units 11/19/2022    9:43 AM 11/18/2022    5:05 AM 11/17/2022    2:49 AM  CBC  WBC 4.0 - 10.5 K/uL 8.8  8.2  9.9   Hemoglobin 12.0 - 15.0 g/dL 8.7  8.8  7.5   Hematocrit 36.0 - 46.0 % 27.2  27.2  23.7   Platelets 150 - 400 K/uL 446  386  278       Latest Ref Rng & Units 11/20/2022    3:17 AM 11/19/2022    4:31 AM 11/18/2022    5:05 AM  CMP  Glucose 70 - 99 mg/dL 102  104  94   BUN 6 - 20 mg/dL '8  5  5   '$ Creatinine 0.44 - 1.00 mg/dL 0.41  0.39  0.38   Sodium 135 - 145 mmol/L 133  132  131   Potassium 3.5 - 5.1 mmol/L 3.9  3.9  3.2   Chloride 98 - 111 mmol/L 98  100  95   CO2 22 - 32 mmol/L '25  26  26   '$ Calcium 8.9 - 10.3 mg/dL 8.5  8.5  8.2     Imaging studies:   CT Abdomen/Pelvis (11/14/2022) personally reviewed showing thickening of the gastric antrum with thickening, stomach distended, no evidence of perforation, and radiologist report reviewed below:  IMPRESSION: 1. Progressive gastric antral ulcer, with adjacent mural thickening throughout the distal antrum and pylorus. Differential includes progressive peptic ulcer disease versus underlying mass and malignant ulceration. There is persistent adjacent fat stranding, without evidence of frank perforation or pneumoperitoneum. High attenuation material within the lumen of the stomach adjacent to the ulceration could reflect blood products and active hemorrhage. Correlation with endoscopy is recommended for further evaluation. 2.  Nonspecific subcentimeter mesenteric lymph nodes surrounding the gastric antrum. 3. 3.1 cm infrarenal abdominal aortic aneurysm. Recommend follow-up every 3 years. Reference: J Am Coll Radiol [id number]. 4.  Aortic Atherosclerosis (ICD10-I70.0).   CT Abdomen/Pelvis (11/18/2022) personally reviewed which shows similar appearance of antral thickening of the stomach (to me slightly improved potentially ?), again there is no free air to suggest perforation, and radiologist report reviewed below:  IMPRESSION: 1. Large irregular ulcer crater extends slightly beyond the confines of the expected wall of the stomach likely due to distortion of the stomach and shows no change compared to more remote imaging in terms of its morphology. This shows perigastric stranding and no extraluminal contrast media with suspected intramural component undermining the more distal stomach. Location and appearance remains suspicious for malignant ulcer of the stomach. Continued correlation may be helpful. 2. Segmental thickening of the colon with surrounding stranding in a distribution that could be seen in the setting of ischemic colitis, difficult to exclude colonic neoplasm. Correlate with any history of hypotension that would explain watershed injury. Decreased perfusion due to infiltration of the transverse mesocolon from inflammation along the greater curvature of the stomach is another differential possibility. Lactate correlation may be helpful. Surgical and GI consultation may be helpful to guide further management. Follow-up endoscopic assessment could also be of benefit when the patient is able. 3. This thickened segment of the colon leads to upstream colonic distension up to 7 cm compatible with partial colonic obstruction or functional colonic obstruction. Close follow-up for this finding is suggested. 4. Areas of peritoneal nodularity may be related to underlying inflammation. However,  suggest at least close follow-up if not repeat assessment for neoplasm. 5. New small bilateral pleural effusions RIGHT greater than LEFT with RIGHT basilar airspace disease. 6. 3.2 cm infrarenal abdominal aortic aneurysm without change. Recommend follow-up every 3 years.   KUB (11/20/2022) personally reviewed showing dilated colon consistent with known colonic ileus, stool in  right colon, no free air, and radiologist report reviewed below: IMPRESSION: Diffuse gaseous distension of bowel with progression of oral contrast material into the colon, compatible with ileus.   Assessment/Plan: (ICD-10's: K90.2) 62 y.o. female with upper abdominal pain and (now resolved) hematemesis found to have gastric antral ulceration with negative biopsy x6 as well as H pylori gastritis.    - Despite six biopsies without evidence of malignancy there still remains concern over the potential for malignancy and requesting evaluation for surgical biopsy. Unfortunately, we do not perform gastric malignancy surgeries here at this facility. If we preformed surgical biopsy and this was malignant, she would ultimately require more extensive intervention which we do not have the capacity for here. There is no evidence of decompensation, uncontrolled bleeding, nor perforation to suggest need for more urgent intervention. Again, all biopsies are without malignancy and CEA is normal x2. As such, I do think it would be in her best interest for referral to tertiary center to evaluate this. I do not think there is any indication for inpatient to inpatient transfer at the moment.    - Okay for diet as tolerated - Agree with PPI gtt    - Treat H pylori; on-going    - Monitor abdominal examination  - Pain control prn; antiemetics prn  - Appreciate GI input and recommendations  - Mobilize as tolerated  - Further management per primary service   - General surgery will remain available should clinical condition change. At a  minimum we can potentially follow up outpatient to aid in referral.   All of the above findings and recommendations were discussed with the patient and her family, and all of their questions were answered to their expressed satisfaction.  Thank you for the opportunity to participate in this patient's care.   -- Edison Simon, PA-C [facility] 11/20/2022, 1:20 PM M-F: 7am - 4pm

## 2022-11-20 NOTE — Progress Notes (Signed)
PT Cancellation Note  Patient Details Name: Erika Downs MRN: [id number] DOB: [birth year]   Cancelled Treatment:    Reason Eval/Treat Not Completed: Fatigue/lethargy limiting ability to participate. Chart reviewed. Upon entry pt politely declining PT treatment due to being tired from multiple consults today. Encouraged pt to mobilize OOB as able and as pain allows to prevent deconditioning. Pt verbalized understanding. Will re-attempt as able.    Salem Caster. Fairly IV, PT, DPT Physical Therapist- [facility]  11/20/2022, 2:58 PM

## 2022-11-20 NOTE — Progress Notes (Signed)
[place] [facility] for Electrolyte Monitoring and Replacement   Recent Labs: Potassium (mmol/L)  Date Value  11/20/2022 3.9  09/12/2012 3.7   Magnesium (mg/dL)  Date Value  11/19/2022 1.9   Calcium (mg/dL)  Date Value  11/20/2022 8.5 (L)   Calcium, Total (mg/dL)  Date Value  09/12/2012 9.2   Albumin (g/dL)  Date Value  11/14/2022 2.8 (L)  09/12/2012 3.9   Phosphorus (mg/dL)  Date Value  11/19/2022 3.8   Sodium (mmol/L)  Date Value  11/20/2022 133 (L)  09/12/2012 141     Assessment: 62 y/o female with h/o bladder prolapse s/p sling surgery, hiatal hernia and HLD who is admitted with GIB. Pharmacy is asked to follow and replace electrolytes.   MIVF: lactated ringers w/ 20 mEq Kcl per liter infusion at 100 mL/hr  Goal of Therapy:  electrolytes within normal limits  Plan:  No replacement needed at this time. Patient currently on scheduled MagOx 200 mg po BID (MD order) Recheck electrolytes in am  Lorin Picket ,PharmD Clinical Pharmacist 11/20/2022 6:49 AM

## 2022-11-20 NOTE — Progress Notes (Signed)
GI Inpatient Follow-up Note  Subjective:  Patient seen in follow-up for large gastric ulcers (2) in antrum and pre-pyloric stomach and ongoing epigastric abdominal pain. No acute events overnight. She continues to endorse severe epigastric abdominal pain. She rates pain currently 10/10 in severity. She is due for her next dose of Dilaudid. She is tolerating clear liquid diet.   Scheduled Inpatient Medications:   bisacodyl  10 mg Rectal Once   clarithromycin  500 mg Oral Q12H   feeding supplement  237 mL Oral TID BM   folic acid  1 mg Intravenous Daily   magnesium oxide  200 mg Oral BID   multivitamin with minerals  1 tablet Oral Daily   nicotine  14 mg Transdermal Daily   [START ON 11/23/2022] pantoprazole  40 mg Intravenous Q12H   sodium chloride flush  3 mL Intravenous Q12H   sucralfate  1 g Oral TID WC & HS   thiamine (VITAMIN B1) injection  100 mg Intravenous Daily    Continuous Inpatient Infusions:    lactated ringers 1,000 mL with potassium chloride 20 mEq infusion 75 mL/hr at 11/20/22 0211   metronidazole 500 mg (11/20/22 1150)   pantoprazole     pantoprazole      PRN Inpatient Medications:  acetaminophen, HYDROmorphone (DILAUDID) injection, ondansetron **OR** ondansetron (ZOFRAN) IV, mouth rinse, oxyCODONE  Review of Systems: Constitutional: Weight is stable.  Eyes: No changes in vision. ENT: No oral lesions, sore throat.  GI: see HPI.  Heme/Lymph: No easy bruising.  CV: No chest pain.  GU: No hematuria.  Integumentary: No rashes.  Neuro: No headaches.  Psych: No depression/anxiety.  Endocrine: No heat/cold intolerance.  Allergic/Immunologic: No urticaria.  Resp: No cough, SOB.  Musculoskeletal: No joint swelling.    Physical Examination: BP (!) 109/94 (BP Location: Left Arm)   Pulse 87   Temp 99.1 F (37.3 C)   Resp 20   Ht '5\' 2"'$  (1.575 m)   Wt 59.6 kg   SpO2 100%   BMI 24.03 kg/m  Gen: NAD, alert and oriented x 4 HEENT: PEERLA, EOMI, Neck:  supple, no JVD or thyromegaly Chest: CTA bilaterally, no wheezes, crackles, or other adventitious sounds CV: RRR, no m/g/c/r Abd: soft, nondistended, +BS in all four quadrants; tender to light and deep palpation diffusely in all four quadrants worse in epigastrium, no HSM, guarding, ridigity, or rebound tenderness Ext: no edema, well perfused with 2+ pulses, Skin: no rash or lesions noted Lymph: no LAD  Data: Lab Results  Component Value Date   WBC 8.8 11/19/2022   HGB 8.7 (L) 11/19/2022   HCT 27.2 (L) 11/19/2022   MCV 87.5 11/19/2022   PLT 446 (H) 11/19/2022   Recent Labs  Lab 11/17/22 0249 11/18/22 0505 11/19/22 0943  HGB 7.5* 8.8* 8.7*   Lab Results  Component Value Date   NA 133 (L) 11/20/2022   K 3.9 11/20/2022   CL 98 11/20/2022   CO2 25 11/20/2022   BUN 8 11/20/2022   CREATININE 0.41 (L) 11/20/2022   Lab Results  Component Value Date   ALT 11 11/14/2022   AST 33 11/14/2022   ALKPHOS 57 11/14/2022   BILITOT 0.5 11/14/2022   Recent Labs  Lab 11/14/22 1607  INR 1.1   KUB: 11/20/2022: IMPRESSION: Diffuse gaseous distension of bowel with progression of oral contrast material into the colon, compatible with ileus.  CT abd/pelvis wo contrast 11/18/2022: IMPRESSION: 1. Large irregular ulcer crater extends slightly beyond the confines of the expected  wall of the stomach likely due to distortion of the stomach and shows no change compared to more remote imaging in terms of its morphology. This shows perigastric stranding and no extraluminal contrast media with suspected intramural component undermining the more distal stomach. Location and appearance remains suspicious for malignant ulcer of the stomach. Continued correlation may be helpful. 2. Segmental thickening of the colon with surrounding stranding in a distribution that could be seen in the setting of ischemic colitis, difficult to exclude colonic neoplasm. Correlate with any history of hypotension  that would explain watershed injury. Decreased perfusion due to infiltration of the transverse mesocolon from inflammation along the greater curvature of the stomach is another differential possibility. Lactate correlation may be helpful. Surgical and GI consultation may be helpful to guide further management. Follow-up endoscopic assessment could also be of benefit when the patient is able. 3. This thickened segment of the colon leads to upstream colonic distension up to 7 cm compatible with partial colonic obstruction or functional colonic obstruction. Close follow-up for this finding is suggested. 4. Areas of peritoneal nodularity may be related to underlying inflammation. However, suggest at least close follow-up if not repeat assessment for neoplasm. 5. New small bilateral pleural effusions RIGHT greater than LEFT with RIGHT basilar airspace disease. 6. 3.2 cm infrarenal abdominal aortic aneurysm without change. Recommend follow-up every 3 years.    KUB 11/18/2022: IMPRESSION: 1. Increasing colonic dilation with ahaustral appearance of the colon. Findings may reflect developing colonic obstruction in the setting of colitis or ileus related to potential local inflammation incited by ulcer disease described on previous imaging. Close follow-up and GI consultation may be warranted. Stomach not well assessed on current imaging. If there is worsening pain could consider CT with oral contrast media as warranted. 2. No signs of rectal gas.  EGD 11/15/2022: Impression:             - Normal esophagus. - 3 cm hiatal hernia. - Non-bleeding gastric ulcer with adherent clot. - Non-bleeding gastric ulcer with no stigmata of bleeding. - Normal examined duodenum. - Red blood in the gastric body. - The examination was otherwise normal. - No specimens collected.  EGD Path: DIAGNOSIS:  A. STOMACH, ANTRUM, EDGE OFULCER; BIOPSY:  - ULCERATED GASTRIC MUCOSA WITH SURFACE INFLAMMATORY EXUDATE  AND FOCAL FIBROBLASTIC REACTION CONSISTENT WITH BED OF ULCER.  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  - NO MALIGNANCY IDENTIFIED ON MULTIPLE LEVELS OF SECTIONING.   CT Abd/Pelvis WO contrast 11/14/2022: IMPRESSION: 1. Progressive gastric antral ulcer, with adjacent mural thickening throughout the distal antrum and pylorus. Differential includes progressive peptic ulcer disease versus underlying mass and malignant ulceration. There is persistent adjacent fat stranding, without evidence of frank perforation or pneumoperitoneum. High attenuation material within the lumen of the stomach adjacent to the ulceration could reflect blood products and active hemorrhage. Correlation with endoscopy is recommended for further evaluation. 2. Nonspecific subcentimeter mesenteric lymph nodes surrounding the gastric antrum. 3. 3.1 cm infrarenal abdominal aortic aneurysm. Recommend follow-up every 3 years. Reference: J Am Coll Radiol [id number]. 4.  Aortic Atherosclerosis (ICD10-I70.0).   Assessment/Plan:   63 y/o Caucasian female with a PMH of HLD and tobacco abuse presented to the [facility] ED 2/17 for 3-day history of progressive epigastric abdominal pain with associated nausea, vomiting, hematemesis, and melena. She was found to have acute blood loss anemia with drop in hemoglobin from baseline >14 to 8.0 with non-contrasted CT scan concerning for progressive gastric antral ulcer versus neoplasm  and malignancy ulceration. She is s/p EGD 2/18 with Dr. Alice Reichert which showed one non-bleeding gastric ulcer with adherent clot in antrum with heaped up edges of ulcer suspicious for malignancy and one non-bleeding cratered gastric ulcer with no stigmata of bleeding found in prepyloric region of stomach   Acute blood loss anemia/UGIB 2/2 gastric ulcers - H&H stable, no evidence of recurrent GI bleeding  H pylori infection 2/2 PUD - on triple therapy with PPI +  Clarithromycin + Flagyl   Epigastric abdominal pain - possibly 2/2 known large gastric ulcers +/- possible malignancy, ileus, MSK, constipation   Nausea/vomiting - resolved    Hematemesis/Melena - resolved    Unintentional weight loss - 65-lbs x <1 year   Tobacco abuse  Protein-calorie malnutrition, severe    DNR status   Recommendations:  - H&H stable currently with no recurrent GI bleeding - Continue to monitor serial H&H. Transfuse for Hgb <7.0.  - Continue supportive care with pain control, anti-emetics, IV fluid hydration PRN - Attempt to wean narcotics as tolerated as this could be contributing to ileus versus developing colonic obstruction  - Continue H pylori eradication therapy - Continue serial abdominal examinations  - Replete electrolytes as necessary per primary team - Appreciate palliative care following to update goals of care - General Surgery consulted and cannot offer any kind of biopsy or interventions at this time. Patient will need evaluation at tertiary care center for further work-up (EUS +/- surgery).  - Dr. Haig Prophet will be following over the weekend   Please call with questions or concerns.   Octavia Bruckner, PA-C [facility] [phone]

## 2022-11-20 NOTE — Progress Notes (Signed)
PROGRESS NOTE    Erika Downs  [id number] DOB: 1960-10-13 DOA: 11/14/2022 PCP: Pcp, No   Brief Narrative: 62 year old with past medical history significant for hyperlipidemia who presents to the ED with abdominal pain and hematemesis.  Admitted with acute blood loss anemia in the setting of upper GI bleed.  Underwent endoscopy through 2/19 showed 2 large also nonbleeding heaped edges  at the ulcer concerning for malignancy.  Biopsy negative for malignancy, Positive H pylori.    Assessment & Plan:   Principal Problem:   Acute upper GI bleed Active Problems:   Acute blood loss anemia   Unintentional weight loss   Tobacco use   Protein-calorie malnutrition, severe   Hypokalemia  1-Acute upper GI bleed, 2 large gastric ulcer Concern for Malignancy, even though Bx negative.  -CT with evidence of progressive gastric antral ulcer with mobile thickening and adjacent fat stranding. -2/18 showed large stomach ulcer in the antrum 3 x 3 cm with heaped up edges concerning but not diagnostic of cancer.  Biopsy pending.  Large clot and ulcer base not removed.  Another large ulcerated 2 cm  near bottom of the stomach near the pyloric channel. -KUB: Increasing colonic dilation with ahaustral appearance of the colon. Findings may reflect developing colonic obstruction in the setting of colitis or ileus related to potential local inflammation incited by ulcer disease described on previous imaging.  -Surgery consulted for evaluation due to concern for malignant gastric Ulcer, and to obtain definitive pathologic diagnosis.  -Started  Carafate. Change to Protonix Gtt.  -Per surgery they don't do type of surgery patient needs here. I spoke with Dr Kae Heller Surgeon at [facility], they wouldn't do Sx without a definitive diagnosis. GI can consider EUS>  -will follow up GI recommendation.   Colonic Ileus:  CT abdomen pelvis: large irregular ulcer. Segmental thickening colon, differentia, ischemic colitis,  difficult to exclude colonic neoplasm, colonic dilation, partial colonic obstruction vs Functional colonic obstruction.  -Continue clear liquid diet.  -Repeat dulcolax sup -check KUB>   H Pillory positive.  Will resume  Clarithromycin and flagyl   Acute Blood loss anemia;  In setting GI bleed.  Received one unit PRBC 2/18 Monitor hb.  She will need IV iron and B 12 supplement out patient.  One dose IV iron ordered. IM B 12   Unintentional weight loss: Patient has had 40 pound weight loss over the last 6 months. The biopsy does not show malignancy  Hypokalemia: Replaced Hypomagnesemia: Replaced. Follow labs.   Protein calorie malnutrition severe ; on nutrition supplement.   Tobacco use nicotine patch ordered counseled.   Nutrition Problem: Severe Malnutrition Etiology: chronic illness    Signs/Symptoms: moderate fat depletion, moderate muscle depletion, percent weight loss Percent weight loss: 25 %       Estimated body mass index is 24.03 kg/m as calculated from the following:   Height as of this encounter: '5\' 2"'$  (1.575 m).   Weight as of this encounter: 59.6 kg.   DVT prophylaxis: SCD Code Status: DNR Family Communication:Care discussed with patient and significant other who was bedside.  Disposition Plan:  Status is: Inpatient Remains inpatient appropriate because: management of gastric ulcer, abdominal pain     Consultants:  Dr  Alice Reichert, GI  Procedures:  Endoscopy   Antimicrobials:    Subjective: She continue to have severe abdominal pain.  Tolerating clear liquid diet.  She report piece stool yesterday after suppository   Objective: Vitals:   11/19/22 1922 11/20/22 0440 11/20/22 [id number]  11/20/22 0801  BP: 137/77 120/69 (!) 109/94   Pulse: 80 77 87   Resp: '16 19 20   '$ Temp: 97.7 F (36.5 C) 98.6 F (37 C) 99.1 F (37.3 C)   TempSrc: Oral Oral    SpO2: 100% 95% 100%   Weight:    59.6 kg  Height:        Intake/Output Summary (Last 24  hours) at 11/20/2022 1239 Last data filed at 11/20/2022 1025 Gross per 24 hour  Intake 1525 ml  Output 900 ml  Net 625 ml    Filed Weights   11/18/22 1202 11/19/22 0500 11/20/22 0801  Weight: 59.9 kg 59.5 kg 59.6 kg    Examination:  General exam: NAD  Respiratory system: CTA Cardiovascular system: S 1, S 2 RRR Gastrointestinal system: BS present, soft, nt Central nervous system: Alert Extremities:No edema   Data Reviewed: I have personally reviewed following labs and imaging studies  CBC: Recent Labs  Lab 11/15/22 1633 11/16/22 0340 11/17/22 0249 11/18/22 0505 11/19/22 0943  WBC 15.1* 13.9* 9.9 8.2 8.8  HGB 7.0* 7.4* 7.5* 8.8* 8.7*  HCT 22.0* 23.2* 23.7* 27.2* 27.2*  MCV 88.7 88.2 87.5 87.2 87.5  PLT 239 237 278 386 446*    Basic Metabolic Panel: Recent Labs  Lab 11/16/22 0340 11/17/22 0249 11/18/22 0505 11/19/22 0431 11/20/22 0317  NA 132* 132* 131* 132* 133*  K 3.0* 2.8* 3.2* 3.9 3.9  CL 98 99 95* 100 98  CO2 '26 25 26 26 25  '$ GLUCOSE 94 98 94 104* 102*  BUN 12 8 5* 5* 8  CREATININE 0.37* 0.37* 0.38* 0.39* 0.41*  CALCIUM 7.7* 7.8* 8.2* 8.5* 8.5*  MG 1.6* 1.8 1.6* 1.9  --   PHOS  --  2.8 3.5 3.8  --     GFR: Estimated Creatinine Clearance: 59.9 mL/min (A) (by C-G formula based on SCr of 0.41 mg/dL (L)). Liver Function Tests: Recent Labs  Lab 11/14/22 1607  AST 33  ALT 11  ALKPHOS 57  BILITOT 0.5  PROT 6.4*  ALBUMIN 2.8*    Recent Labs  Lab 11/14/22 1607  LIPASE 23    No results for input(s): "AMMONIA" in the last 168 hours. Coagulation Profile: Recent Labs  Lab 11/14/22 1607  INR 1.1    Cardiac Enzymes: No results for input(s): "CKTOTAL", "CKMB", "CKMBINDEX", "TROPONINI" in the last 168 hours. BNP (last 3 results) No results for input(s): "PROBNP" in the last 8760 hours. HbA1C: No results for input(s): "HGBA1C" in the last 72 hours. CBG: Recent Labs  Lab 11/14/22 1831  GLUCAP 110*    Lipid Profile: No results for  input(s): "CHOL", "HDL", "LDLCALC", "TRIG", "CHOLHDL", "LDLDIRECT" in the last 72 hours. Thyroid Function Tests: No results for input(s): "TSH", "T4TOTAL", "FREET4", "T3FREE", "THYROIDAB" in the last 72 hours. Anemia Panel: No results for input(s): "VITAMINB12", "FOLATE", "FERRITIN", "TIBC", "IRON", "RETICCTPCT" in the last 72 hours.  Sepsis Labs: Recent Labs  Lab 11/19/22 0943 11/19/22 1306  LATICACIDVEN 0.7 0.7     Recent Results (from the past 240 hour(s))  MRSA Next Gen by PCR, Nasal     Status: None   Collection Time: 11/14/22  6:39 PM   Specimen: Nasal Mucosa; Nasal Swab  Result Value Ref Range Status   MRSA by PCR Next Gen NOT DETECTED NOT DETECTED Final    Comment: (NOTE) The GeneXpert MRSA Assay (FDA approved for NASAL specimens only), is one component of a comprehensive MRSA colonization surveillance program. It is not intended to  diagnose MRSA infection nor to guide or monitor treatment for MRSA infections. Test performance is not FDA approved in patients less than 62 years old. Performed at [facility], [address] [place], [place] [id number]          Radiology Studies: CT ABDOMEN PELVIS WO CONTRAST  Result Date: 11/18/2022 CLINICAL DATA:  62 year old female presents for evaluation of abdominal pain and worsening colonic distension. EXAM: CT ABDOMEN AND PELVIS WITHOUT CONTRAST TECHNIQUE: Multidetector CT imaging of the abdomen and pelvis was performed following the standard protocol without IV contrast. RADIATION DOSE REDUCTION: This exam was performed according to the departmental dose-optimization program which includes automated exposure control, adjustment of the mA and/or kV according to patient size and/or use of iterative reconstruction technique. COMPARISON:  November 14, 2022. FINDINGS: Lower chest: Small effusions bilaterally largest on the RIGHT with RIGHT basilar consolidation. Granuloma in the lingula. Hepatobiliary: Liver with smooth  contours. No focal, suspicious lesion on noncontrast imaging with limited evaluation. No pericholecystic stranding. No gross biliary duct distension. Pancreas: Pancreas with normal contours, no signs of inflammation or peripancreatic fluid. Spleen: Normal. Adrenals/Urinary Tract: Adrenal glands are normal. Smooth contour the bilateral kidneys. No hydronephrosis. No perinephric stranding. No perivesical stranding. Ureters difficult to assess but are nondilated. Stomach/Bowel: No extravasation of oral contrast media beyond the confines of the stomach with large irregular ulcer crater extending beyond the expected gastric lumen or distorting the normal gastric lumen. This appears undermined and has AA component of intramural extension best seen on image 36/5 margins are irregular and heat. There is perigastric infiltration. Gastrohepatic lymph nodes with mild enlargement (image 19/2) 11 mm lymph node in the gastrohepatic ligament. Perigastric nodules are present along the greater curvature of the stomach. Small nodule present in the LEFT upper quadrant measures 6 mm (image 42/5). Nodule adjacent to the ulcer measuring 16 mm (image 37/5) Small bowel without signs of obstruction. Oral contrast passes into the distal small bowel just upstream from the terminal ileum. Appendix not visible but no secondary signs to suggest acute appendicitis. Colon is in fact dilated with dilation of the transverse colon up to 7 cm. At the level of the hepatic flexure and distal transverse colon there is colonic thickening most pronounced in the distal transverse colon just proximal to the splenic flexure with surrounding stranding (image 42/5) the colon was decompressed previously but perhaps thickened along the descending portion of the colon which is not evident currently there is stranding extending from the gastric ulcer but substantial stranding extending to the distal transverse colon beyond what is seen extending towards other  structures in the upper abdomen. Vascular/Lymphatic: Gastrohepatic nodes with slight enlargement. 3.2 cm infrarenal abdominal aortic aneurysm without change. No pelvic sidewall lymphadenopathy. Reproductive: Unremarkable to the extent evaluated. Other: No pneumoperitoneum or extravasation of contrast. Subtle nodularity in the transverse mesocolon and about the lateral aspect of the stomach in LEFT upper quadrant is of uncertain significance. Musculoskeletal: No acute bone finding. No destructive bone process. Spinal degenerative changes. IMPRESSION: 1. Large irregular ulcer crater extends slightly beyond the confines of the expected wall of the stomach likely due to distortion of the stomach and shows no change compared to more remote imaging in terms of its morphology. This shows perigastric stranding and no extraluminal contrast media with suspected intramural component undermining the more distal stomach. Location and appearance remains suspicious for malignant ulcer of the stomach. Continued correlation may be helpful. 2. Segmental thickening of the colon with surrounding stranding in a  distribution that could be seen in the setting of ischemic colitis, difficult to exclude colonic neoplasm. Correlate with any history of hypotension that would explain watershed injury. Decreased perfusion due to infiltration of the transverse mesocolon from inflammation along the greater curvature of the stomach is another differential possibility. Lactate correlation may be helpful. Surgical and GI consultation may be helpful to guide further management. Follow-up endoscopic assessment could also be of benefit when the patient is able. 3. This thickened segment of the colon leads to upstream colonic distension up to 7 cm compatible with partial colonic obstruction or functional colonic obstruction. Close follow-up for this finding is suggested. 4. Areas of peritoneal nodularity may be related to underlying inflammation.  However, suggest at least close follow-up if not repeat assessment for neoplasm. 5. New small bilateral pleural effusions RIGHT greater than LEFT with RIGHT basilar airspace disease. 6. 3.2 cm infrarenal abdominal aortic aneurysm without change. Recommend follow-up every 3 years. Electronically Signed   By: Zetta Bills M.D.   On: 11/18/2022 18:45        Scheduled Meds:  bisacodyl  10 mg Rectal Once   clarithromycin  500 mg Oral Q12H   feeding supplement  237 mL Oral TID BM   folic acid  1 mg Intravenous Daily   magnesium oxide  200 mg Oral BID   multivitamin with minerals  1 tablet Oral Daily   nicotine  14 mg Transdermal Daily   [START ON 11/23/2022] pantoprazole  40 mg Intravenous Q12H   sodium chloride flush  3 mL Intravenous Q12H   sucralfate  1 g Oral TID WC & HS   thiamine (VITAMIN B1) injection  100 mg Intravenous Daily   Continuous Infusions:  lactated ringers 1,000 mL with potassium chloride 20 mEq infusion 75 mL/hr at 11/20/22 0211   metronidazole 500 mg (11/20/22 1150)   pantoprazole     pantoprazole       LOS: 6 days    Time spent: 35 minutes    Erika Fuster A Danika Kluender, MD [facility]   If 7PM-7AM, please contact night-coverage [email or web address]  11/20/2022, 12:39 PM

## 2022-11-21 LAB — GLUCOSE, CAPILLARY
Glucose-Capillary: 106 mg/dL — ABNORMAL HIGH (ref 70–99)
Glucose-Capillary: 108 mg/dL — ABNORMAL HIGH (ref 70–99)
Glucose-Capillary: 106 mg/dL — ABNORMAL HIGH (ref 70–99)

## 2022-11-21 LAB — CBC
HCT: 27 % — ABNORMAL LOW (ref 36.0–46.0)
Hemoglobin: 8.5 g/dL — ABNORMAL LOW (ref 12.0–15.0)
MCH: 27.9 pg (ref 26.0–34.0)
MCHC: 31.5 g/dL (ref 30.0–36.0)
MCV: 88.5 fL (ref 80.0–100.0)
Platelets: 482 10*3/uL — ABNORMAL HIGH (ref 150–400)
RBC: 3.05 MIL/uL — ABNORMAL LOW (ref 3.87–5.11)
RDW: 14 % (ref 11.5–15.5)
WBC: 9.3 10*3/uL (ref 4.0–10.5)
nRBC: 0 % (ref 0.0–0.2)

## 2022-11-21 LAB — BASIC METABOLIC PANEL
Anion gap: 10 (ref 5–15)
BUN: 6 mg/dL (ref 6–20)
CO2: 22 mmol/L (ref 22–32)
Calcium: 8.3 mg/dL — ABNORMAL LOW (ref 8.9–10.3)
Chloride: 98 mmol/L (ref 98–111)
Creatinine, Ser: 0.51 mg/dL (ref 0.44–1.00)
GFR, Estimated: >60 mL/min (ref >60)
Glucose, Bld: 106 mg/dL — ABNORMAL HIGH (ref 70–99)
Potassium: 3.7 mmol/L (ref 3.5–5.1)
Sodium: 130 mmol/L — ABNORMAL LOW (ref 135–145)

## 2022-11-21 LAB — PHOSPHORUS: Phosphorus: 4.1 mg/dL (ref 2.5–4.6)

## 2022-11-21 LAB — MAGNESIUM: Magnesium: 1.8 mg/dL (ref 1.7–2.4)

## 2022-11-21 MED ORDER — SENNOSIDES-DOCUSATE SODIUM 8.6-50 MG PO TABS
1.0000 | ORAL_TABLET | Freq: Two times a day (BID) | ORAL | Status: DC
  Administered 2022-11-21 – 2022-11-24 (×7): 1 via ORAL
  Filled 2022-11-21 (×7): qty 1

## 2022-11-21 MED ORDER — CYANOCOBALAMIN 1000 MCG/ML IJ SOLN
1000.0000 ug | Freq: Once | INTRAMUSCULAR | Status: AC
  Administered 2022-11-21: 1000 ug via INTRAMUSCULAR
  Filled 2022-11-21: qty 1

## 2022-11-21 MED ORDER — MAGNESIUM SULFATE 2 GM/50ML IV SOLN
2.0000 g | Freq: Once | INTRAVENOUS | Status: AC
  Administered 2022-11-21: 2 g via INTRAVENOUS
  Filled 2022-11-21: qty 50

## 2022-11-21 MED ORDER — BISACODYL 10 MG RE SUPP: 10.0000 mg | Freq: Every day | RECTAL | Status: DC | PRN

## 2022-11-21 MED ORDER — HYDROMORPHONE HCL 1 MG/ML IJ SOLN
0.5000 mg | INTRAMUSCULAR | Status: DC | PRN
  Administered 2022-11-21 – 2022-11-22 (×4): 0.5 mg via INTRAVENOUS
  Filled 2022-11-21 (×4): qty 0.5

## 2022-11-21 NOTE — Progress Notes (Signed)
PROGRESS NOTE    Erika Downs  [id number] DOB: December 15, 1960 DOA: 11/14/2022 PCP: Pcp, No   Brief Narrative: 62 year old with past medical history significant for hyperlipidemia who presents to the ED with abdominal pain and hematemesis.  Admitted with acute blood loss anemia in the setting of upper GI bleed.  Underwent endoscopy through 2/19 showed 2 large also nonbleeding heaped edges  at the ulcer concerning for malignancy.  Biopsy negative for malignancy, Positive H pylori.    Assessment & Plan:   Principal Problem:   Acute upper GI bleed Active Problems:   Acute blood loss anemia   Unintentional weight loss   Tobacco use   Protein-calorie malnutrition, severe   Hypokalemia  1-Acute upper GI bleed, 2 large gastric ulcer Concern for Malignancy, even though Bx negative.  -CT with evidence of progressive gastric antral ulcer with mobile thickening and adjacent fat stranding. -2/18 showed large stomach ulcer in the antrum 3 x 3 cm with heaped up edges concerning but not diagnostic of cancer.  Biopsy pending.  Large clot and ulcer base not removed.  Another large ulcerated 2 cm  near bottom of the stomach near the pyloric channel. -KUB: Increasing colonic dilation with ahaustral appearance of the colon. Findings may reflect developing colonic obstruction in the setting of colitis or ileus related to potential local inflammation incited by ulcer disease described on previous imaging.  -Surgery consulted for evaluation due to concern for malignant gastric Ulcer, and to obtain definitive pathologic diagnosis.  -Started  Carafate. Change to Protonix Gtt 2/23.  - I spoke with Dr Kae Heller Surgeon at [facility], they wouldn't do Sx without a definitive diagnosis.  -Surgery recommend out patient referral to tertiary center  for further Bx and or evaluation.  -Patient feels much better today, after she was started on Protonix gtt and had large Bowel movement.  -Plan to advanced diet, keep  protonix gtt.   Colonic Ileus:  CT abdomen pelvis: large irregular ulcer. Segmental thickening colon, differentia, ischemic colitis, difficult to exclude colonic neoplasm, colonic dilation, partial colonic obstruction vs Functional colonic obstruction.  -Continue clear liquid diet.  -KUB; Diffuse gaseous distension of bowel with progression of oral contrast material into the colon, compatible with ileus. Had large Bowel movement yesterday. Feels better.  Resolving, advance diet   H Pillory positive.  Continue with  Clarithromycin and flagyl   Acute Blood loss anemia;  In setting GI bleed.  Received one unit PRBC 2/18 Monitor hb.  She will need IV iron and B 12 supplement out patient.  One dose IV iron ordered. IM B 12   Unintentional weight loss: Patient has had 40 pound weight loss over the last 6 months. The biopsy does not show malignancy  Hypokalemia: Replaced Hypomagnesemia: Replaced. Follow labs.  Hyponatremia; replete IV fluids.   Protein calorie malnutrition severe ; on nutrition supplement.   Tobacco use nicotine patch ordered counseled.   Nutrition Problem: Severe Malnutrition Etiology: chronic illness    Signs/Symptoms: moderate fat depletion, moderate muscle depletion, percent weight loss Percent weight loss: 25 %       Estimated body mass index is 24.03 kg/m as calculated from the following:   Height as of this encounter: '5\' 2"'$  (1.575 m).   Weight as of this encounter: 59.6 kg.   DVT prophylaxis: SCD Code Status: DNR Family Communication:Care discussed with patient and significant other who was bedside.  Disposition Plan:  Status is: Inpatient Remains inpatient appropriate because: management of gastric ulcer, abdominal pain  Consultants:  Dr  Alice Reichert, GI  Procedures:  Endoscopy   Antimicrobials:    Subjective: She is feeling better, abdominal pain significantly improved. Her goal is to get less IV pain meds. Asking for her diet to  be advanced. She had large BM yesterday. Asking of another suppository   Objective: Vitals:   11/20/22 1545 11/20/22 1945 11/21/22 0421 11/21/22 0816  BP: (!) 121/59 130/63 (!) 119/54 113/65  Pulse: 77 81 89 84  Resp: '18 16 18 16  '$ Temp: 98.2 F (36.8 C) 98.5 F (36.9 C) 98.4 F (36.9 C) 98.3 F (36.8 C)  TempSrc:  Oral Oral   SpO2: 95% 95% 94% 94%  Weight:      Height:        Intake/Output Summary (Last 24 hours) at 11/21/2022 0932 Last data filed at 11/21/2022 0631 Gross per 24 hour  Intake 2083.11 ml  Output 1150 ml  Net 933.11 ml    Filed Weights   11/18/22 1202 11/19/22 0500 11/20/22 0801  Weight: 59.9 kg 59.5 kg 59.6 kg    Examination:  General exam: NAD Respiratory system: CTA Cardiovascular system: S 1, S 2 RRR Gastrointestinal system: BS present, soft, nt, nd Central nervous system: Alert, Follows command Extremities: No edema   Data Reviewed: I have personally reviewed following labs and imaging studies  CBC: Recent Labs  Lab 11/16/22 0340 11/17/22 0249 11/18/22 0505 11/19/22 0943 11/21/22 0459  WBC 13.9* 9.9 8.2 8.8 9.3  HGB 7.4* 7.5* 8.8* 8.7* 8.5*  HCT 23.2* 23.7* 27.2* 27.2* 27.0*  MCV 88.2 87.5 87.2 87.5 88.5  PLT 237 278 386 446* 482*    Basic Metabolic Panel: Recent Labs  Lab 11/16/22 0340 11/17/22 0249 11/18/22 0505 11/19/22 0431 11/20/22 0317 11/21/22 0459  NA 132* 132* 131* 132* 133* 130*  K 3.0* 2.8* 3.2* 3.9 3.9 3.7  CL 98 99 95* 100 98 98  CO2 '26 25 26 26 25 22  '$ GLUCOSE 94 98 94 104* 102* 106*  BUN 12 8 5* 5* 8 6  CREATININE 0.37* 0.37* 0.38* 0.39* 0.41* 0.51  CALCIUM 7.7* 7.8* 8.2* 8.5* 8.5* 8.3*  MG 1.6* 1.8 1.6* 1.9  --  1.8  PHOS  --  2.8 3.5 3.8  --  4.1    GFR: Estimated Creatinine Clearance: 59.9 mL/min (by C-G formula based on SCr of 0.51 mg/dL). Liver Function Tests: Recent Labs  Lab 11/14/22 1607  AST 33  ALT 11  ALKPHOS 57  BILITOT 0.5  PROT 6.4*  ALBUMIN 2.8*    Recent Labs  Lab  11/14/22 1607  LIPASE 23    No results for input(s): "AMMONIA" in the last 168 hours. Coagulation Profile: Recent Labs  Lab 11/14/22 1607  INR 1.1    Cardiac Enzymes: No results for input(s): "CKTOTAL", "CKMB", "CKMBINDEX", "TROPONINI" in the last 168 hours. BNP (last 3 results) No results for input(s): "PROBNP" in the last 8760 hours. HbA1C: No results for input(s): "HGBA1C" in the last 72 hours. CBG: Recent Labs  Lab 11/14/22 1831 11/21/22 0821  GLUCAP 110* 106*    Lipid Profile: No results for input(s): "CHOL", "HDL", "LDLCALC", "TRIG", "CHOLHDL", "LDLDIRECT" in the last 72 hours. Thyroid Function Tests: No results for input(s): "TSH", "T4TOTAL", "FREET4", "T3FREE", "THYROIDAB" in the last 72 hours. Anemia Panel: No results for input(s): "VITAMINB12", "FOLATE", "FERRITIN", "TIBC", "IRON", "RETICCTPCT" in the last 72 hours.  Sepsis Labs: Recent Labs  Lab 11/19/22 0943 11/19/22 1306  LATICACIDVEN 0.7 0.7     Recent Results (  from the past 240 hour(s))  MRSA Next Gen by PCR, Nasal     Status: None   Collection Time: 11/14/22  6:39 PM   Specimen: Nasal Mucosa; Nasal Swab  Result Value Ref Range Status   MRSA by PCR Next Gen NOT DETECTED NOT DETECTED Final    Comment: (NOTE) The GeneXpert MRSA Assay (FDA approved for NASAL specimens only), is one component of a comprehensive MRSA colonization surveillance program. It is not intended to diagnose MRSA infection nor to guide or monitor treatment for MRSA infections. Test performance is not FDA approved in patients less than 68 years old. Performed at [facility], [address], [place], [place] [address]          Radiology Studies: DG Abd 1 View  Result Date: 11/20/2022 CLINICAL DATA:  Abdominal pain, ileus EXAM: ABDOMEN - 1 VIEW COMPARISON:  CT 11/18/2022 FINDINGS: There is diffuse gaseous distension of bowel in the abdomen. Contrast material has progressed into the colon. No radiopaque  calculi overlie the kidneys. No acute osseous abnormality. IMPRESSION: Diffuse gaseous distension of bowel with progression of oral contrast material into the colon, compatible with ileus. Electronically Signed   By: Maurine Simmering M.D.   On: 11/20/2022 13:45        Scheduled Meds:  clarithromycin  500 mg Oral Q12H   feeding supplement  237 mL Oral TID BM   folic acid  1 mg Intravenous Daily   magnesium oxide  200 mg Oral BID   multivitamin with minerals  1 tablet Oral Daily   nicotine  14 mg Transdermal Daily   [START ON 11/23/2022] pantoprazole  40 mg Intravenous Q12H   senna-docusate  1 tablet Oral BID   sodium chloride flush  3 mL Intravenous Q12H   sucralfate  1 g Oral TID WC & HS   thiamine (VITAMIN B1) injection  100 mg Intravenous Daily   Continuous Infusions:  lactated ringers 1,000 mL with potassium chloride 20 mEq infusion 75 mL/hr at 11/21/22 0207   metronidazole 500 mg (11/21/22 0911)   pantoprazole 8 mg/hr (11/21/22 0206)     LOS: 7 days    Time spent: 35 minutes    Tocarra Gassen A Callia Swim, MD [facility]   If 7PM-7AM, please contact night-coverage [email or web address]  11/21/2022, 9:32 AM

## 2022-11-21 NOTE — Progress Notes (Signed)
Mobility Specialist - Progress Note     11/21/22 1200  Mobility  Activity Ambulated with assistance to bathroom;Stood at bedside;Dangled on edge of bed  Level of Assistance Independent  Assistive Device None  Distance Ambulated (ft) 10 ft  Range of Motion/Exercises Active  Activity Response Tolerated well  Mobility Referral Yes  $Mobility charge 1 Mobility   Pt resting in bed on RA upon entry. Pt STS and ambulates to bathroom Indep with no AD. Pt returned to chair and left with needs in reach.   Loma Sender Mobility Specialist 11/21/22, 2:02 PM

## 2022-11-22 LAB — GLUCOSE, CAPILLARY
Glucose-Capillary: 130 mg/dL — ABNORMAL HIGH (ref 70–99)
Glucose-Capillary: 92 mg/dL (ref 70–99)

## 2022-11-22 LAB — VITAMIN D 25 HYDROXY (VIT D DEFICIENCY, FRACTURES): Vit D, 25-Hydroxy: 47.27 ng/mL (ref 30–100)

## 2022-11-22 LAB — OSMOLALITY: Osmolality: 275 mOsm/kg (ref 275–295)

## 2022-11-22 MED ORDER — CYANOCOBALAMIN 1000 MCG/ML IJ SOLN
1000.0000 ug | Freq: Every day | INTRAMUSCULAR | Status: DC
  Administered 2022-11-22 – 2022-11-24 (×3): 1000 ug via INTRAMUSCULAR
  Filled 2022-11-22 (×3): qty 1

## 2022-11-22 MED ORDER — HYDROMORPHONE HCL 1 MG/ML IJ SOLN
0.5000 mg | Freq: Four times a day (QID) | INTRAMUSCULAR | Status: DC | PRN
  Administered 2022-11-22: 0.5 mg via INTRAVENOUS
  Filled 2022-11-22: qty 0.5

## 2022-11-22 MED ORDER — POLYETHYLENE GLYCOL 3350 17 G PO PACK
17.0000 g | PACK | Freq: Every day | ORAL | Status: DC
  Administered 2022-11-22 – 2022-11-24 (×3): 17 g via ORAL
  Filled 2022-11-22 (×3): qty 1

## 2022-11-22 MED ORDER — VITAMIN B-12 1000 MCG PO TABS: 1000.0000 ug | ORAL_TABLET | Freq: Every day | ORAL | Status: DC

## 2022-11-22 MED ORDER — SODIUM CHLORIDE 0.9 % IV SOLN
300.0000 mg | Freq: Every day | INTRAVENOUS | Status: AC
  Administered 2022-11-22 – 2022-11-24 (×3): 300 mg via INTRAVENOUS
  Filled 2022-11-22 (×3): qty 300

## 2022-11-22 NOTE — Progress Notes (Signed)
Mobility Specialist - Progress Note     11/22/22 1400  Mobility  Activity Ambulated with assistance to bathroom;Stood at bedside  Level of Assistance Independent  Assistive Device None  Distance Ambulated (ft) 10 ft  Range of Motion/Exercises Active  Activity Response Tolerated well  Mobility Referral Yes  $Mobility charge 1 Mobility    [employer] Mobility Specialist 11/22/22, 2:19 PM

## 2022-11-22 NOTE — Progress Notes (Signed)
Mobility Specialist - Progress Note    11/21/22 1400  Mobility  Activity Ambulated with assistance in hallway;Stood at bedside  Level of Assistance Independent  Assistive Device None  Distance Ambulated (ft) 480 ft  Range of Motion/Exercises Active  Activity Response Tolerated well  Mobility Referral Yes  $Mobility charge 1 Mobility    [employer] Mobility Specialist 11/22/22, 8:45 AM

## 2022-11-22 NOTE — Progress Notes (Signed)
[facility] Progress Note  Patient: Erika Downs    [id number]  DOA: 11/14/2022     Date of Service: the patient was seen and examined on 11/22/2022  Chief Complaint  Patient presents with   Abdominal Pain   Brief hospital course: 62 year old with past medical history significant for hyperlipidemia who presents to the ED with abdominal pain and hematemesis. Admitted with acute blood loss anemia in the setting of upper GI bleed. Underwent endoscopy through 2/19 showed 2 large also nonbleeding heaped edges at the ulcer concerning for malignancy. Biopsy negative for malignancy, Positive H pylori.    Assessment and Plan: Principal Problem:   Acute upper GI bleed Active Problems:   Acute blood loss anemia   Unintentional weight loss   Tobacco use   Protein-calorie malnutrition, severe   Hypokalemia   # Acute upper GI bleed, 2 large gastric ulcer Concern for Malignancy, even though Bx negative.  -CT with evidence of progressive gastric antral ulcer with mobile thickening and adjacent fat stranding. -2/18 showed large stomach ulcer in the antrum 3 x 3 cm with heaped up edges concerning but not diagnostic of cancer.  Biopsy pending.  Large clot and ulcer base not removed.  Another large ulcerated 2 cm  near bottom of the stomach near the pyloric channel. -KUB: Increasing colonic dilation with ahaustral appearance of the colon. Findings may reflect developing colonic obstruction in the setting of colitis or ileus related to potential local inflammation incited by ulcer disease described on previous imaging.  -Surgery consulted for evaluation due to concern for malignant gastric Ulcer, and to obtain definitive pathologic diagnosis.  -Started  Carafate. Change to Protonix Gtt 2/23 for 72 hrs  - d/w Dr Kae Heller Surgeon at [facility], they wouldn't do Sx without a definitive diagnosis.  -Surgery recommend out patient referral to tertiary center  for further Bx and or evaluation.   -Continue Protonix gtt, diet was advanced to soft diet 2/25 Hb 8.5 stable   # Colonic Ileus:  CT abdomen pelvis: large irregular ulcer. Segmental thickening colon, differentia, ischemic colitis, difficult to exclude colonic neoplasm, colonic dilation, partial colonic obstruction vs Functional colonic obstruction.  -Continue clear liquid diet.  -KUB; Diffuse gaseous distension of bowel with progression of oral contrast material into the colon, compatible with ileus. Had large Bowel movement on 2/23. Feels better.  Resolving, advance diet    # H. pylori positive, possible causing PUD Continue triple therapy PPI, Clarithromycin and flagyl     Acute Blood loss anemia;  In setting GI bleed.  Received one unit PRBC 2/18 Monitor hb.   Iron deficiency anemia, transferrin saturation 5% One dose IV iron was given 2/25 Started Venofer 300 mg IV daily x 3 days Start oral iron supplement on discharge. Vitamin B12 level 221, goal >400, started vitamin B12 1000 mcg IM injection daily during hospital stay followed by oral supplement.   Unintentional weight loss: Patient has had 40 pound weight loss over the last 6 months. The biopsy does not show malignancy   Hypokalemia: Replaced Hypomagnesemia: Replaced. Follow labs.  Hyponatremia; replete IV fluids.    Protein calorie malnutrition severe ; on nutrition supplement.    Tobacco use nicotine patch ordered counseled.     Nutrition Problem: Severe Malnutrition Etiology: chronic illness       Signs/Symptoms: moderate fat depletion, moderate muscle depletion, percent weight loss Percent weight loss: 25 %   Body mass index is 20.81 kg/m.  Nutrition Problem: Severe Malnutrition Etiology: chronic illness Interventions:  Diet: Soft diet DVT Prophylaxis: SCD, pharmacological prophylaxis contraindicated due to GI bleeding    Advance goals of care discussion: DNR  Family Communication: family was present at bedside, at the time of  interview.  The pt provided permission to discuss medical plan with the family. Opportunity was given to ask question and all questions were answered satisfactorily.   Disposition:  Pt is from Home, admitted with GI bleeding, still IV PPI infusion, which precludes a safe discharge. Discharge to home, when clinically stable, may need 1-2 more days to improve.  Subjective: No significant events overnight, patient was complaining of some epigastric pain 5/10, denied any nausea vomiting, no diarrhea.  Denied any chest pain or palpitation, no shortness of breath.  Physical Exam: General: NAD, lying comfortably Appear in no distress, affect appropriate Eyes: PERRLA ENT: Oral Mucosa Clear, moist  Neck: no JVD,  Cardiovascular: S1 and S2 Present, no Murmur,  Respiratory: good respiratory effort, Bilateral Air entry equal and Decreased, no Crackles, no wheezes Abdomen: Bowel Sound present, Soft and no tenderness,  Skin: no rashes Extremities: no Pedal edema, no calf tenderness Neurologic: without any new focal findings Gait not checked due to patient safety concerns  Vitals:   11/21/22 2039 11/22/22 0456 11/22/22 0753 11/22/22 1545  BP: (!) 104/59 117/68 111/66 107/67  Pulse: 82 85 79 76  Resp: '20  20 18  '$ Temp: 98.8 F (37.1 C) 99.3 F (37.4 C) 98.2 F (36.8 C) 97.9 F (36.6 C)  TempSrc:  Oral    SpO2: 96% 92% 97% 97%  Weight:      Height:        Intake/Output Summary (Last 24 hours) at 11/22/2022 1546 Last data filed at 11/22/2022 1016 Gross per 24 hour  Intake 720 ml  Output --  Net 720 ml   Filed Weights   11/19/22 0500 11/20/22 0801 11/21/22 0716  Weight: 59.5 kg 59.6 kg 51.6 kg    Data Reviewed: I have personally reviewed and interpreted daily labs, tele strips, imagings as discussed above. I reviewed all nursing notes, pharmacy notes, vitals, pertinent old records I have discussed plan of care as described above with RN and patient/family.  CBC: Recent Labs  Lab  11/16/22 0340 11/17/22 0249 11/18/22 0505 11/19/22 0943 11/21/22 0459  WBC 13.9* 9.9 8.2 8.8 9.3  HGB 7.4* 7.5* 8.8* 8.7* 8.5*  HCT 23.2* 23.7* 27.2* 27.2* 27.0*  MCV 88.2 87.5 87.2 87.5 88.5  PLT 237 278 386 446* 123XX123*   Basic Metabolic Panel: Recent Labs  Lab 11/16/22 0340 11/17/22 0249 11/18/22 0505 11/19/22 0431 11/20/22 0317 11/21/22 0459  NA 132* 132* 131* 132* 133* 130*  K 3.0* 2.8* 3.2* 3.9 3.9 3.7  CL 98 99 95* 100 98 98  CO2 '26 25 26 26 25 22  '$ GLUCOSE 94 98 94 104* 102* 106*  BUN 12 8 5* 5* 8 6  CREATININE 0.37* 0.37* 0.38* 0.39* 0.41* 0.51  CALCIUM 7.7* 7.8* 8.2* 8.5* 8.5* 8.3*  MG 1.6* 1.8 1.6* 1.9  --  1.8  PHOS  --  2.8 3.5 3.8  --  4.1    Studies: No results found.  Scheduled Meds:  clarithromycin  500 mg Oral Q12H   cyanocobalamin  1,000 mcg Intramuscular Q1200   Followed by   Derrill Memo ON 11/28/2022] vitamin B-12  1,000 mcg Oral Daily   feeding supplement  237 mL Oral TID BM   folic acid  1 mg Intravenous Daily   magnesium oxide  200 mg Oral  BID   multivitamin with minerals  1 tablet Oral Daily   nicotine  14 mg Transdermal Daily   [START ON 11/23/2022] pantoprazole  40 mg Intravenous Q12H   polyethylene glycol  17 g Oral Daily   senna-docusate  1 tablet Oral BID   sodium chloride flush  3 mL Intravenous Q12H   sucralfate  1 g Oral TID WC & HS   thiamine (VITAMIN B1) injection  100 mg Intravenous Daily   Continuous Infusions:  iron sucrose 300 mg (11/22/22 1209)   lactated ringers 1,000 mL with potassium chloride 20 mEq infusion 75 mL/hr at 11/21/22 2043   metronidazole 500 mg (11/22/22 1039)   pantoprazole 8 mg/hr (11/22/22 1256)   PRN Meds: acetaminophen, bisacodyl, HYDROmorphone (DILAUDID) injection, ondansetron **OR** ondansetron (ZOFRAN) IV, mouth rinse, oxyCODONE  Time spent: 55 minutes  Author: Val Riles. MD [facility] 11/22/2022 3:46 PM  To reach On-call, see care teams to locate the attending and reach out to them via  [email or web address]. If 7PM-7AM, please contact night-coverage If you still have difficulty reaching the attending provider, please page the [facility] (Director on Call) for [facility] on amion for assistance.

## 2022-11-22 NOTE — Progress Notes (Addendum)
Mobility Specialist - Progress Note    11/22/22 1104  Mobility  Activity Ambulated with assistance in hallway;Ambulated independently in hallway  Level of Assistance Independent  Assistive Device None  Range of Motion/Exercises Active  Activity Response Tolerated well  Mobility Referral Yes  $Mobility charge 1 Mobility   Pt resting in bed on RA upon entry. Pt STS and ambulates to hallway for 4 lap (682f) around NS Indep. Pt returned to bed and left with needs in reach. Nursing student and instructor present in room.   ALoma SenderMobility Specialist 11/22/22, 11:06 AM

## 2022-11-23 LAB — BASIC METABOLIC PANEL
Anion gap: 10 (ref 5–15)
BUN: 12 mg/dL (ref 6–20)
CO2: 22 mmol/L (ref 22–32)
Calcium: 8.4 mg/dL — ABNORMAL LOW (ref 8.9–10.3)
Chloride: 101 mmol/L (ref 98–111)
Creatinine, Ser: 0.56 mg/dL (ref 0.44–1.00)
GFR, Estimated: >60 mL/min (ref >60)
Glucose, Bld: 108 mg/dL — ABNORMAL HIGH (ref 70–99)
Potassium: 3.5 mmol/L (ref 3.5–5.1)
Sodium: 133 mmol/L — ABNORMAL LOW (ref 135–145)

## 2022-11-23 LAB — CBC
HCT: 25.8 % — ABNORMAL LOW (ref 36.0–46.0)
Hemoglobin: 8 g/dL — ABNORMAL LOW (ref 12.0–15.0)
MCH: 27.5 pg (ref 26.0–34.0)
MCHC: 31 g/dL (ref 30.0–36.0)
MCV: 88.7 fL (ref 80.0–100.0)
Platelets: 591 10*3/uL — ABNORMAL HIGH (ref 150–400)
RBC: 2.91 MIL/uL — ABNORMAL LOW (ref 3.87–5.11)
RDW: 14.3 % (ref 11.5–15.5)
WBC: 9.9 10*3/uL (ref 4.0–10.5)
nRBC: 0 % (ref 0.0–0.2)

## 2022-11-23 LAB — PHOSPHORUS: Phosphorus: 3.3 mg/dL (ref 2.5–4.6)

## 2022-11-23 LAB — MAGNESIUM: Magnesium: 1.8 mg/dL (ref 1.7–2.4)

## 2022-11-23 MED ORDER — METRONIDAZOLE 500 MG PO TABS
500.0000 mg | ORAL_TABLET | Freq: Two times a day (BID) | ORAL | Status: DC
  Administered 2022-11-23 – 2022-11-24 (×2): 500 mg via ORAL
  Filled 2022-11-23 (×2): qty 1

## 2022-11-23 MED ORDER — FOLIC ACID 1 MG PO TABS
1.0000 mg | ORAL_TABLET | Freq: Every day | ORAL | Status: DC
  Administered 2022-11-24: 1 mg via ORAL
  Filled 2022-11-23: qty 1

## 2022-11-23 MED ORDER — THIAMINE MONONITRATE 100 MG PO TABS
100.0000 mg | ORAL_TABLET | Freq: Every day | ORAL | Status: DC
  Administered 2022-11-24: 100 mg via ORAL
  Filled 2022-11-23: qty 1

## 2022-11-23 NOTE — Progress Notes (Signed)
Nutrition Follow Up Note   DOCUMENTATION CODES:   Severe malnutrition in context of chronic illness  INTERVENTION:   Ensure Enlive po TID, each supplement provides 350 kcal and 20 grams of protein.  Magic cup TID with meals, each supplement provides 290 kcal and 9 grams of protein  MVI po daily   NUTRITION DIAGNOSIS:   Severe Malnutrition related to chronic illness as evidenced by moderate fat depletion, moderate muscle depletion, 25 percent weight loss in 1 year.  GOAL:   Patient will meet greater than or equal to 90% of their needs -progressing   MONITOR:   PO intake, Supplement acceptance, Labs, Weight trends, I & O's, Skin  ASSESSMENT:   62 y/o female with h/o bladder prolapse s/p sling surgery, hiatal hernia and HLD who is admitted with GIB. Pt s/p EGD 2/18 and was found to have two gastric uclers and H. Pylori positive.  Pt with good appetite and oral intake; pt ate 95% of her breakfast this morning and is drinking her Ensure supplements. Refeed labs stable. Pt does report intermittent abdominal pain. Biopsies negative for malignancy. Recommend continue supplements and MVI after discharge; this was discussed with patient. Per chart, pt is up ~ 15lbs from her UBW.  Medications reviewed and include: biaxin, [id number], folic acid, Mg oxide, MVI, nicotine, protonix, miralax, senokot, carafate, thiamine, iron sucrose, LRS '@75ml'$ /hr, metronidazole   Labs reviewed: Na 133(L), K 3.5 wnl, P 3.3 wnl, Mg 1.8 wnl Hgb 8.0(L), Hct 25.8(L)  Diet Order:   Diet Order             DIET SOFT Room service appropriate? Yes; Fluid consistency: Thin  Diet effective now                  EDUCATION NEEDS:   Education needs have been addressed  Skin:  Skin Assessment: Reviewed RN Assessment  Last BM:  2/25  Height:   Ht Readings from Last 1 Encounters:  11/14/22 '5\' 2"'$  (1.575 m)    Weight:   Wt Readings from Last 1 Encounters:  11/23/22 50.8 kg    Ideal Body Weight:  50  kg  BMI:  Body mass index is 20.48 kg/m.  Estimated Nutritional Needs:   Kcal:  1600-1800kcal/day  Protein:  80-90g/day  Fluid:  1.6-1.8L/day  Koleen Distance MS, RD, LDN Please refer to [facility] for RD and/or RD on-call/weekend/after hours pager

## 2022-11-23 NOTE — Progress Notes (Signed)
[facility] Progress Note  Patient: Erika Downs    [id number]  DOA: 11/14/2022     Date of Service: the patient was seen and examined on 11/23/2022  Chief Complaint  Patient presents with   Abdominal Pain   Brief hospital course: 62 year old with past medical history significant for hyperlipidemia who presents to the ED with abdominal pain and hematemesis. Admitted with acute blood loss anemia in the setting of upper GI bleed. Underwent endoscopy through 2/19 showed 2 large also nonbleeding heaped edges at the ulcer concerning for malignancy. Biopsy negative for malignancy, Positive H pylori.    Assessment and Plan: Principal Problem:   Acute upper GI bleed Active Problems:   Acute blood loss anemia   Unintentional weight loss   Tobacco use   Protein-calorie malnutrition, severe   Hypokalemia   # Acute upper GI bleed, 2 large gastric ulcer Concern for Malignancy, even though Bx negative.  -CT with evidence of progressive gastric antral ulcer with mobile thickening and adjacent fat stranding. -2/18 showed large stomach ulcer in the antrum 3 x 3 cm with heaped up edges concerning but not diagnostic of cancer.  Biopsy pending.  Large clot and ulcer base not removed.  Another large ulcerated 2 cm  near bottom of the stomach near the pyloric channel. -KUB: Increasing colonic dilation with ahaustral appearance of the colon. Findings may reflect developing colonic obstruction in the setting of colitis or ileus related to potential local inflammation incited by ulcer disease described on previous imaging.  -Surgery consulted for evaluation due to concern for malignant gastric Ulcer, and to obtain definitive pathologic diagnosis.  -Continue Carafate. -s/p Protonix gtt x 72 hrs and transition to PPI 40 mg IV twice daily, we will transition to oral on discharge. - d/w Dr Kae Heller Surgeon at [facility], they wouldn't do Sx without a definitive diagnosis.  -Surgery recommend out patient  referral to tertiary center  for further Bx and or evaluation.  Diet was advanced to soft diet and tolerating well. 2/26 Hb 8.0 slightly low as compared to yesterday   # Colonic Ileus:  CT abdomen pelvis: large irregular ulcer. Segmental thickening colon, differentia, ischemic colitis, difficult to exclude colonic neoplasm, colonic dilation, partial colonic obstruction vs Functional colonic obstruction.  -KUB; Diffuse gaseous distension of bowel with progression of oral contrast material into the colon, compatible with ileus. Had large Bowel movement on 2/23. Feels better.  Resolving, advance diet  Continue stool softeners  # H. pylori positive, possible causing PUD Continue triple therapy PPI, Clarithromycin and flagyl     Acute Blood loss anemia;  In setting GI bleed.  Received one unit PRBC 2/18 Monitor hb.   Iron deficiency anemia, transferrin saturation 5% One dose IV iron was given 2/25 Started Venofer 300 mg IV daily x 3 days Start oral iron supplement on discharge. Vitamin B12 level 221, goal >400, started vitamin B12 1000 mcg IM injection daily during hospital stay followed by oral supplement.   Unintentional weight loss: Patient has had 40 pound weight loss over the last 6 months. The biopsy does not show malignancy   Hypokalemia: Replaced Hypomagnesemia: Replaced. Follow labs.  Hyponatremia; replete IV fluids.    Protein calorie malnutrition severe ; on nutrition supplement.    Tobacco use nicotine patch ordered counseled.     Nutrition Problem: Severe Malnutrition Etiology: chronic illness       Signs/Symptoms: moderate fat depletion, moderate muscle depletion, percent weight loss Percent weight loss: 25 %   Body  mass index is 20.81 kg/m.  Nutrition Problem: Severe Malnutrition Etiology: chronic illness Interventions:     Diet: Soft diet DVT Prophylaxis: SCD, pharmacological prophylaxis contraindicated due to GI bleeding    Advance goals of care  discussion: DNR  Family Communication: family was present at bedside, at the time of interview.  The pt provided permission to discuss medical plan with the family. Opportunity was given to ask question and all questions were answered satisfactorily.   Disposition:  Pt is from Home, admitted with GI bleeding, still IV PPI infusion, which precludes a safe discharge. Discharge to home, when clinically stable, may need 1-2 more days to improve.  Subjective: No significant events overnight, patient still has epigastric pain 6/10, but she feels a lot better, patient had BM last night. Palpitation, no shortness of breath, no any other active issues.   Physical Exam: General: NAD, lying comfortably Appear in no distress, affect appropriate Eyes: PERRLA ENT: Oral Mucosa Clear, moist  Neck: no JVD,  Cardiovascular: S1 and S2 Present, no Murmur,  Respiratory: good respiratory effort, Bilateral Air entry equal and Decreased, no Crackles, no wheezes Abdomen: Bowel Sound present, Soft and no tenderness,  Skin: no rashes Extremities: no Pedal edema, no calf tenderness Neurologic: without any new focal findings Gait not checked due to patient safety concerns  Vitals:   11/22/22 2057 11/23/22 0503 11/23/22 0804 11/23/22 1152  BP: (!) 124/59 113/60 118/64   Pulse: 85 87 78   Resp:   16   Temp:  100.3 F (37.9 C) 98 F (36.7 C)   TempSrc:  Oral Oral   SpO2: 96% 94% 99%   Weight:    50.8 kg  Height:        Intake/Output Summary (Last 24 hours) at 11/23/2022 1414 Last data filed at 11/23/2022 1000 Gross per 24 hour  Intake 2119.68 ml  Output --  Net 2119.68 ml   Filed Weights   11/20/22 0801 11/21/22 0716 11/23/22 1152  Weight: 59.6 kg 51.6 kg 50.8 kg    Data Reviewed: I have personally reviewed and interpreted daily labs, tele strips, imagings as discussed above. I reviewed all nursing notes, pharmacy notes, vitals, pertinent old records I have discussed plan of care as described  above with RN and patient/family.  CBC: Recent Labs  Lab 11/17/22 0249 11/18/22 0505 11/19/22 0943 11/21/22 0459 11/23/22 0345  WBC 9.9 8.2 8.8 9.3 9.9  HGB 7.5* 8.8* 8.7* 8.5* 8.0*  HCT 23.7* 27.2* 27.2* 27.0* 25.8*  MCV 87.5 87.2 87.5 88.5 88.7  PLT 278 386 446* 482* 123456*   Basic Metabolic Panel: Recent Labs  Lab 11/17/22 0249 11/18/22 0505 11/19/22 0431 11/20/22 0317 11/21/22 0459 11/23/22 0345  NA 132* 131* 132* 133* 130* 133*  K 2.8* 3.2* 3.9 3.9 3.7 3.5  CL 99 95* 100 98 98 101  CO2 '25 26 26 25 22 22  '$ GLUCOSE 98 94 104* 102* 106* 108*  BUN 8 5* 5* '8 6 12  '$ CREATININE 0.37* 0.38* 0.39* 0.41* 0.51 0.56  CALCIUM 7.8* 8.2* 8.5* 8.5* 8.3* 8.4*  MG 1.8 1.6* 1.9  --  1.8 1.8  PHOS 2.8 3.5 3.8  --  4.1 3.3    Studies: No results found.  Scheduled Meds:  clarithromycin  500 mg Oral Q12H   cyanocobalamin  1,000 mcg Intramuscular Q1200   Followed by   Derrill Memo ON 11/28/2022] vitamin B-12  1,000 mcg Oral Daily   feeding supplement  237 mL Oral TID BM   [START  ON 123XX123 folic acid  1 mg Oral Daily   magnesium oxide  200 mg Oral BID   metroNIDAZOLE  500 mg Oral Q12H   multivitamin with minerals  1 tablet Oral Daily   nicotine  14 mg Transdermal Daily   pantoprazole  40 mg Intravenous Q12H   polyethylene glycol  17 g Oral Daily   senna-docusate  1 tablet Oral BID   sodium chloride flush  3 mL Intravenous Q12H   sucralfate  1 g Oral TID WC & HS   [START ON 11/24/2022] thiamine  100 mg Oral Daily   Continuous Infusions:  iron sucrose 300 mg (11/23/22 1042)   PRN Meds: acetaminophen, bisacodyl, HYDROmorphone (DILAUDID) injection, ondansetron **OR** ondansetron (ZOFRAN) IV, mouth rinse, oxyCODONE  Time spent: 50 minutes  Author: Val Riles. MD [facility] 11/23/2022 2:14 PM  To reach On-call, see care teams to locate the attending and reach out to them via [email or web address]. If 7PM-7AM, please contact night-coverage If you still have difficulty reaching the  attending provider, please page the [facility] (Director on Call) for [facility] on amion for assistance.

## 2022-11-23 NOTE — Progress Notes (Signed)
Physical Therapy Treatment Patient Details Name: Erika Downs MRN: [id number] DOB: 1961-03-11 Today's Date: 11/23/2022   History of Present Illness Erika Downs states that she has been experiencing nausea with vomiting that began suddenly approximately 3 days ago.  Shortly thereafter, she developed diffuse abdominal pain that is worst in the epigastric region.  Then last night, her emesis turned bright red and has been bloody ever since.    PT Comments    Pt has made significant progress since initial eval. Appears close to baseline function. Currently ambulating on level surface without assistive device with ModI. Pt cleared to walk in halls independently to continue to increase strength and endurance. Awaiting d/c home tomorrow - per pt.   Recommendations for follow up therapy are one component of a multi-disciplinary discharge planning process, led by the attending physician.  Recommendations may be updated based on patient status, additional functional criteria and insurance authorization.  Follow Up Recommendations  Home health PT (Pt declining)     Assistance Recommended at Discharge PRN  Patient can return home with the following Assist for transportation;Help with stairs or ramp for entrance   Equipment Recommendations  None recommended by PT    Recommendations for Other Services       Precautions / Restrictions Precautions Precautions: Fall Restrictions Weight Bearing Restrictions: No     Mobility  Bed Mobility Overal bed mobility: Modified Independent Bed Mobility: Supine to Sit, Sit to Supine     Supine to sit: Modified independent (Device/Increase time) Sit to supine: Modified independent (Device/Increase time)        Transfers Overall transfer level: Modified independent                      Ambulation/Gait Ambulation/Gait assistance: Modified independent (Device/Increase time) Gait Distance (Feet): 600 Feet Assistive device: None Gait  Pattern/deviations: WFL(Within Functional Limits)       General Gait Details:  (Good regular paced steps, appears at baseline)   Chief Strategy Officer    Modified Rankin (Stroke Patients Only)       Balance Overall balance assessment: Needs assistance Sitting-balance support: No upper extremity supported Sitting balance-Leahy Scale: Normal     Standing balance support: No upper extremity supported Standing balance-Leahy Scale: Good                              Cognition Arousal/Alertness: Awake/alert Behavior During Therapy: WFL for tasks assessed/performed Overall Cognitive Status: Within Functional Limits for tasks assessed                                 General Comments: motivated        Exercises      General Comments General comments (skin integrity, edema, etc.):  (Pt appears close to functional baseline, no HHPT needs or DME)      Pertinent Vitals/Pain Pain Assessment Pain Assessment: No/denies pain    Home Living                          Prior Function            PT Goals (current goals can now be found in the care plan section) Acute Rehab PT Goals Patient Stated Goal:  (Get home)    Frequency  Min 2X/week      PT Plan Current plan remains appropriate    Co-evaluation              AM-PAC PT "6 Clicks" Mobility   Outcome Measure  Help needed turning from your back to your side while in a flat bed without using bedrails?: None Help needed moving from lying on your back to sitting on the side of a flat bed without using bedrails?: None Help needed moving to and from a bed to a chair (including a wheelchair)?: None Help needed standing up from a chair using your arms (e.g., wheelchair or bedside chair)?: None Help needed to walk in hospital room?: None Help needed climbing 3-5 steps with a railing? : A Little 6 Click Score: 23    End of Session Equipment Utilized  During Treatment: Gait belt Activity Tolerance: Patient tolerated treatment well Patient left: in bed;with call bell/phone within reach Nurse Communication: Mobility status (Cleared to walk unassisted in halls) PT Visit Diagnosis: Unsteadiness on feet (R26.81);Pain;Difficulty in walking, not elsewhere classified (R26.2);Muscle weakness (generalized) (M62.81)     Time: [id number] PT Time Calculation (min) (ACUTE ONLY): 18 min  Charges:  $Gait Training: 8-22 mins                    Mikel Cella, PTA   Josie Dixon 11/23/2022, 2:30 PM

## 2022-11-24 ENCOUNTER — Other Ambulatory Visit: Payer: Self-pay

## 2022-11-24 LAB — BASIC METABOLIC PANEL
Anion gap: 9 (ref 5–15)
BUN: 11 mg/dL (ref 6–20)
CO2: 24 mmol/L (ref 22–32)
Calcium: 8.6 mg/dL — ABNORMAL LOW (ref 8.9–10.3)
Chloride: 99 mmol/L (ref 98–111)
Creatinine, Ser: 0.61 mg/dL (ref 0.44–1.00)
GFR, Estimated: >60 mL/min (ref >60)
Glucose, Bld: 123 mg/dL — ABNORMAL HIGH (ref 70–99)
Potassium: 3.4 mmol/L — ABNORMAL LOW (ref 3.5–5.1)
Sodium: 132 mmol/L — ABNORMAL LOW (ref 135–145)

## 2022-11-24 LAB — CBC
HCT: 26.2 % — ABNORMAL LOW (ref 36.0–46.0)
Hemoglobin: 8.2 g/dL — ABNORMAL LOW (ref 12.0–15.0)
MCH: 27.8 pg (ref 26.0–34.0)
MCHC: 31.3 g/dL (ref 30.0–36.0)
MCV: 88.8 fL (ref 80.0–100.0)
Platelets: 624 10*3/uL — ABNORMAL HIGH (ref 150–400)
RBC: 2.95 MIL/uL — ABNORMAL LOW (ref 3.87–5.11)
RDW: 14.3 % (ref 11.5–15.5)
WBC: 9.6 10*3/uL (ref 4.0–10.5)
nRBC: 0 % (ref 0.0–0.2)

## 2022-11-24 LAB — MAGNESIUM: Magnesium: 1.8 mg/dL (ref 1.7–2.4)

## 2022-11-24 LAB — PHOSPHORUS: Phosphorus: 3.4 mg/dL (ref 2.5–4.6)

## 2022-11-24 MED ORDER — SUCRALFATE 1 G PO TABS
1.0000 g | ORAL_TABLET | Freq: Three times a day (TID) | ORAL | 0 refills | Status: DC
  Filled 2022-11-24: qty 120, 30d supply, fill #0
  Filled 2022-11-24: qty 1200, 30d supply, fill #0

## 2022-11-24 MED ORDER — FERROUS SULFATE 325 (65 FE) MG PO TBEC
325.0000 mg | DELAYED_RELEASE_TABLET | Freq: Two times a day (BID) | ORAL | 2 refills | Status: DC
  Filled 2022-11-24: qty 60, 30d supply, fill #0

## 2022-11-24 MED ORDER — POTASSIUM CHLORIDE CRYS ER 20 MEQ PO TBCR
40.0000 meq | EXTENDED_RELEASE_TABLET | Freq: Once | ORAL | Status: AC
  Administered 2022-11-24: 40 meq via ORAL
  Filled 2022-11-24: qty 2

## 2022-11-24 MED ORDER — PANTOPRAZOLE SODIUM 40 MG PO TBEC
40.0000 mg | DELAYED_RELEASE_TABLET | Freq: Every day | ORAL | 11 refills | Status: DC
  Filled 2022-11-24: qty 30, 30d supply, fill #0

## 2022-11-24 MED ORDER — VITAMIN B-1 100 MG PO TABS
100.0000 mg | ORAL_TABLET | Freq: Every day | ORAL | 0 refills | Status: AC
  Filled 2022-11-24: qty 30, 30d supply, fill #0

## 2022-11-24 MED ORDER — ACETAMINOPHEN 325 MG PO TABS: 650.0000 mg | ORAL_TABLET | Freq: Three times a day (TID) | ORAL | Status: DC | PRN

## 2022-11-24 MED ORDER — CYANOCOBALAMIN 1000 MCG PO TABS
1000.0000 ug | ORAL_TABLET | Freq: Every day | ORAL | 2 refills | Status: AC
  Filled 2022-11-24: qty 30, 30d supply, fill #0

## 2022-11-24 MED ORDER — MAGNESIUM OXIDE -MG SUPPLEMENT 400 (240 MG) MG PO TABS
400.0000 mg | ORAL_TABLET | Freq: Every day | ORAL | 0 refills | Status: AC
  Filled 2022-11-24: qty 30, 30d supply, fill #0

## 2022-11-24 MED ORDER — METRONIDAZOLE 500 MG PO TABS
500.0000 mg | ORAL_TABLET | Freq: Two times a day (BID) | ORAL | 0 refills | Status: AC
  Filled 2022-11-24: qty 20, 10d supply, fill #0

## 2022-11-24 MED ORDER — CLARITHROMYCIN 500 MG PO TABS
500.0000 mg | ORAL_TABLET | Freq: Two times a day (BID) | ORAL | 0 refills | Status: AC
  Filled 2022-11-24: qty 20, 10d supply, fill #0

## 2022-11-24 NOTE — Progress Notes (Addendum)
Mobility Specialist - Progress Note    11/24/22 1000  Mobility  Activity Ambulated independently to bathroom;Stood at bedside;Dangled on edge of bed  Level of Assistance Independent  Assistive Device None  Distance Ambulated (ft) 10 ft  Range of Motion/Exercises Active  Activity Response Tolerated well  Mobility Referral Yes  $Mobility charge 1 Mobility   Pt resting in bed on RA upon entry. Pt ambulates to bathroom Indep with no AD. Pt returned to bed and left with needs in reach. RN Present.   Loma Sender Mobility Specialist 11/24/22, 10:41 AM

## 2022-11-24 NOTE — TOC Transition Note (Signed)
Transition of Care [facility]) - CM/SW Discharge Note   Patient Details  Name: Erika Downs MRN: [id number] Date of Birth: December 04, 1960  Transition of Care [facility]) CM/SW Contact:  Candie Chroman, LCSW Phone Number: 11/24/2022, 1:19 PM   Clinical Narrative:  Patient has orders to discharge home today. Volunteer services will bring medications to the room. No further concerns. CSW signing off.   Final next level of care: Home/Self Care Barriers to Discharge: Barriers Resolved   Patient Goals and CMS Choice      Discharge Placement                      Patient and family notified of of transfer: 11/24/22  Discharge Plan and Services Additional resources added to the After Visit Summary for                                       Social Determinants of Health (SDOH) Interventions SDOH Screenings   Food Insecurity: No Food Insecurity (11/16/2022)  Housing: Low Risk  (11/16/2022)  Transportation Needs: No Transportation Needs (11/16/2022)  Utilities: Not At Risk (11/16/2022)  Tobacco Use: High Risk (11/16/2022)     Readmission Risk Interventions     No data to display

## 2022-11-24 NOTE — Progress Notes (Signed)
Mobility Specialist - Progress Note     11/24/22 1042  Mobility  Activity Ambulated independently in hallway  Level of Assistance Independent  Assistive Device None  Distance Ambulated (ft) 320 ft  Range of Motion/Exercises Active  Activity Response Tolerated well  Mobility Referral Yes  $Mobility charge 1 Mobility   Pt resting in bed on RA upon entry. Pt STS and ambulates to hallway around NS for 2 laps with no AD. Pt returned to bed and left with needs in reach. RN Present.   Loma Sender Mobility Specialist 11/24/22, 10:43 AM

## 2022-11-24 NOTE — Progress Notes (Signed)
Patient AVS discussed completely ( all appointments, new meds, and what signs or symptoms to watch for. Also to look at stool and make sure it is not bloody) . All patient belongings returned, Home Meds bought up from pharmacy and given to patient. Patient significant other here at bedside to transport home. Patient IV removed and patient Discharged home.

## 2022-11-24 NOTE — Discharge Summary (Signed)
[facility] Discharge Summary   Patient: Erika Downs [id number]  PCP: Pcp, No  Date of admission: 11/14/2022   Date of discharge:  11/24/2022     Discharge Diagnoses:  Principal Problem:   Acute upper GI bleed Active Problems:   Acute blood loss anemia   Unintentional weight loss   Tobacco use   Protein-calorie malnutrition, severe   Hypokalemia   Admitted From: Home Disposition:  Home  Recommendations for Outpatient Follow-up:  F/u with PCP in 1 wk, repeat CBC after 1 wk for Hb F/u GI for further w/up of PUD ulcer and possible malignancy Follow up LABS/TEST:  CBC in 1 wk   Follow-up Information     Efrain Sella, MD. Go on 03/16/2023.   Specialty: Gastroenterology Why: [facility] Discharge F/UP. Go at  3:00pm. Contact information: [place] [address] [phone]         Sindy Guadeloupe, MD. Schedule an appointment as soon as possible for a visit on 11/30/2022.   Specialty: Oncology Why: [facility] Discharge F/UP. You already have a appointment on 11/30/22 at 10:45am. Contact information: [address] [place] [address] [phone]                Diet recommendation: Regular diet  Activity: The patient is advised to gradually reintroduce usual activities, as tolerated  Discharge Condition: stable  Code Status: Full code   History of present illness: As per the H and P dictated on admission Hospital Course:  62 year old with past medical history significant for hyperlipidemia who presents to the ED with abdominal pain and hematemesis. Admitted with acute blood loss anemia in the setting of upper GI bleed. Underwent endoscopy through 2/19 showed 2 large also nonbleeding heaped edges at the ulcer concerning for malignancy. Biopsy negative for malignancy, Positive H pylori.  Assessment and Plan:   # Acute upper GI bleed, 2 large gastric ulcer Concern for Malignancy, even though Bx negative.  -CT with  evidence of progressive gastric antral ulcer with mobile thickening and adjacent fat stranding. -2/18 showed large stomach ulcer in the antrum 3 x 3 cm with heaped up edges concerning but not diagnostic of cancer.  Biopsy pending.  Large clot and ulcer base not removed.  Another large ulcerated 2 cm  near bottom of the stomach near the pyloric channel. -KUB: Increasing colonic dilation with ahaustral appearance of the colon. Findings may reflect developing colonic obstruction in the setting of colitis or ileus related to potential local inflammation incited by ulcer disease described on previous imaging.  -Surgery consulted for evaluation due to concern for malignant gastric Ulcer, and to obtain definitive pathologic diagnosis.  -Continue Carafate. -s/p Protonix gtt x 72 hrs and transition to PPI 40 mg IV twice daily, we will transition to oral on discharge. - d/w Dr Kae Heller Surgeon at [facility], they wouldn't do Sx without a definitive diagnosis. Surgery recommend out patient referral to tertiary center  for further Bx and or evaluation. Diet was advanced to soft diet and tolerating well. 2/27 Hb 8.2 stable, patient was advised to follow with PCP and repeat CBC after 1 week.  Patient was discharged on Carafate and PPI.  GI and oncology as an outpatient.  # Colonic Ileus: Resolved CT abdomen pelvis: large irregular ulcer. Segmental thickening colon, differentia, ischemic colitis, difficult to exclude colonic neoplasm, colonic dilation, partial colonic obstruction vs Functional colonic obstruction.  -KUB; Diffuse gaseous distension of bowel with progression of oral contrast material  into the colon, compatible with ileus. Had large Bowel movement on 2/23. Feels better. advance diet, did well. Continue stool softeners # H. pylori positive, possible causing PUD, Continue triple therapy PPI, Clarithromycin and flagyl.  Antibiotics for total 2 weeks.  Follow with GI for further management as an outpatient. #  Acute Blood loss anemia; In setting GI bleed. Received one unit PRBC 2/18 # Iron deficiency anemia, transferrin saturation 5%, One dose IV iron was given 2/25 Started Venofer 300 mg IV daily x 3 days. Start oral iron supplement on discharge. # Vitamin B12 level 221, goal >400, started vitamin B12 1000 mcg IM injection daily during hospital stay followed by oral supplement. # Unintentional weight loss: Patient has had 40 pound weight loss over the last 6 months. The biopsy does not show malignancy.  Follow-up with oncologist as an outpatient. # Hypokalemia: Replaced.  # Hypomagnesemia: Replaced. Resolved  # Hyponatremia; replete IV fluids. Na 132 remained stable, follow with PCP for further management. # Protein calorie malnutrition severe ; on nutrition supplement.  # Tobacco, nicotine patch was provided during hospital stay.  Smoking cessation counseling done.    Body mass index is 20.52 kg/m.  Nutrition Problem: Severe Malnutrition Etiology: chronic illness Nutrition Interventions:  Patient was ambulatory without any assistance. On the day of the discharge the patient's vitals were stable, and no other acute medical condition were reported by patient. the patient was felt safe to be discharge at Home.  Consultants: GI Procedures: EGD  Discharge Exam: General: Appear in no distress, no Rash; Oral Mucosa Clear, moist. Cardiovascular: S1 and S2 Present, no Murmur, Respiratory: normal respiratory effort, Bilateral Air entry present and no Crackles, no wheezes Abdomen: Bowel Sound present, Soft and no tenderness, no hernia Extremities: no Pedal edema, no calf tenderness Neurology: alert and oriented to time, place, and person affect appropriate.  Filed Weights   11/21/22 0716 11/23/22 1152 11/24/22 0500  Weight: 51.6 kg 50.8 kg 50.9 kg   Vitals:   11/23/22 1943 11/24/22 0416  BP: 125/63 117/63  Pulse: 83 85  Resp: 20 20  Temp: 99.5 F (37.5 C) 99.8 F (37.7 C)  SpO2: 99% 93%     DISCHARGE MEDICATION: Allergies as of 11/24/2022       Reactions   Ibuprofen Hives, Swelling   Ivp Dye [iodinated Contrast Media] Hives   PT states she was injected at [facility] and had hives   Penicillins Hives, Swelling   Has patient had a PCN reaction causing immediate rash, facial/tongue/throat swelling, SOB or lightheadedness with hypotension: Yes Has patient had a PCN reaction causing severe rash involving mucus membranes or skin necrosis: No Has patient had a PCN reaction that required hospitalization No Has patient had a PCN reaction occurring within the last 10 years: No If all of the above answers are "NO", then may proceed with Cephalosporin use.        Medication List     TAKE these medications    acetaminophen 325 MG tablet Commonly known as: TYLENOL Take 2 tablets (650 mg total) by mouth every 8 (eight) hours as needed for mild pain, fever or headache (headache).   clarithromycin 500 MG tablet Commonly known as: BIAXIN Take 1 tablet (500 mg total) by mouth every 12 (twelve) hours for 10 days.   cyanocobalamin 1000 MCG tablet Take 1 tablet (1,000 mcg total) by mouth daily. Start taking on: November 28, 2022   ferrous sulfate 325 (65 FE) MG EC tablet Take 1 tablet (325 mg  total) by mouth 2 (two) times daily.   magnesium oxide 400 (240 Mg) MG tablet Commonly known as: MAG-OX Take 1 tablet (400 mg total) by mouth daily.   metroNIDAZOLE 500 MG tablet Commonly known as: FLAGYL Take 1 tablet (500 mg total) by mouth every 12 (twelve) hours for 10 days.   pantoprazole 40 MG tablet Commonly known as: Protonix Take 1 tablet (40 mg total) by mouth daily.   sucralfate 1 GM/10ML suspension Commonly known as: CARAFATE Take 10 mLs (1 g total) by mouth 4 (four) times daily -  with meals and at bedtime.   thiamine 100 MG tablet Commonly known as: Vitamin B-1 Take 1 tablet (100 mg total) by mouth daily. Start taking on: November 25, 2022       Allergies  Allergen  Reactions   Ibuprofen Hives and Swelling   Ivp Dye [Iodinated Contrast Media] Hives    PT states she was injected at [facility] and had hives   Penicillins Hives and Swelling    Has patient had a PCN reaction causing immediate rash, facial/tongue/throat swelling, SOB or lightheadedness with hypotension: Yes Has patient had a PCN reaction causing severe rash involving mucus membranes or skin necrosis: No Has patient had a PCN reaction that required hospitalization No Has patient had a PCN reaction occurring within the last 10 years: No If all of the above answers are "NO", then may proceed with Cephalosporin use.    Discharge Instructions     Call MD for:  difficulty breathing, headache or visual disturbances   Complete by: As directed    Call MD for:  extreme fatigue   Complete by: As directed    Call MD for:  persistant dizziness or light-headedness   Complete by: As directed    Call MD for:  persistant nausea and vomiting   Complete by: As directed    Call MD for:  severe uncontrolled pain   Complete by: As directed    Call MD for:  temperature >100.4   Complete by: As directed    Diet - low sodium heart healthy   Complete by: As directed    Discharge instructions   Complete by: As directed    F/u with PCP in 1 wk, repeat CBC after 1 wk for Hb F/u GI for further w/up of PUD ulcer and possible malignancy   Increase activity slowly   Complete by: As directed        The results of significant diagnostics from this hospitalization (including imaging, microbiology, ancillary and laboratory) are listed below for reference.    Significant Diagnostic Studies: DG Abd 1 View  Result Date: 11/20/2022 CLINICAL DATA:  Abdominal pain, ileus EXAM: ABDOMEN - 1 VIEW COMPARISON:  CT 11/18/2022 FINDINGS: There is diffuse gaseous distension of bowel in the abdomen. Contrast material has progressed into the colon. No radiopaque calculi overlie the kidneys. No acute osseous abnormality. IMPRESSION:  Diffuse gaseous distension of bowel with progression of oral contrast material into the colon, compatible with ileus. Electronically Signed   By: Maurine Simmering M.D.   On: 11/20/2022 13:45   CT ABDOMEN PELVIS WO CONTRAST  Result Date: 11/18/2022 CLINICAL DATA:  62 year old female presents for evaluation of abdominal pain and worsening colonic distension. EXAM: CT ABDOMEN AND PELVIS WITHOUT CONTRAST TECHNIQUE: Multidetector CT imaging of the abdomen and pelvis was performed following the standard protocol without IV contrast. RADIATION DOSE REDUCTION: This exam was performed according to the departmental dose-optimization program which includes automated exposure control, adjustment  of the mA and/or kV according to patient size and/or use of iterative reconstruction technique. COMPARISON:  November 14, 2022. FINDINGS: Lower chest: Small effusions bilaterally largest on the RIGHT with RIGHT basilar consolidation. Granuloma in the lingula. Hepatobiliary: Liver with smooth contours. No focal, suspicious lesion on noncontrast imaging with limited evaluation. No pericholecystic stranding. No gross biliary duct distension. Pancreas: Pancreas with normal contours, no signs of inflammation or peripancreatic fluid. Spleen: Normal. Adrenals/Urinary Tract: Adrenal glands are normal. Smooth contour the bilateral kidneys. No hydronephrosis. No perinephric stranding. No perivesical stranding. Ureters difficult to assess but are nondilated. Stomach/Bowel: No extravasation of oral contrast media beyond the confines of the stomach with large irregular ulcer crater extending beyond the expected gastric lumen or distorting the normal gastric lumen. This appears undermined and has AA component of intramural extension best seen on image 36/5 margins are irregular and heat. There is perigastric infiltration. Gastrohepatic lymph nodes with mild enlargement (image 19/2) 11 mm lymph node in the gastrohepatic ligament. Perigastric nodules  are present along the greater curvature of the stomach. Small nodule present in the LEFT upper quadrant measures 6 mm (image 42/5). Nodule adjacent to the ulcer measuring 16 mm (image 37/5) Small bowel without signs of obstruction. Oral contrast passes into the distal small bowel just upstream from the terminal ileum. Appendix not visible but no secondary signs to suggest acute appendicitis. Colon is in fact dilated with dilation of the transverse colon up to 7 cm. At the level of the hepatic flexure and distal transverse colon there is colonic thickening most pronounced in the distal transverse colon just proximal to the splenic flexure with surrounding stranding (image 42/5) the colon was decompressed previously but perhaps thickened along the descending portion of the colon which is not evident currently there is stranding extending from the gastric ulcer but substantial stranding extending to the distal transverse colon beyond what is seen extending towards other structures in the upper abdomen. Vascular/Lymphatic: Gastrohepatic nodes with slight enlargement. 3.2 cm infrarenal abdominal aortic aneurysm without change. No pelvic sidewall lymphadenopathy. Reproductive: Unremarkable to the extent evaluated. Other: No pneumoperitoneum or extravasation of contrast. Subtle nodularity in the transverse mesocolon and about the lateral aspect of the stomach in LEFT upper quadrant is of uncertain significance. Musculoskeletal: No acute bone finding. No destructive bone process. Spinal degenerative changes. IMPRESSION: 1. Large irregular ulcer crater extends slightly beyond the confines of the expected wall of the stomach likely due to distortion of the stomach and shows no change compared to more remote imaging in terms of its morphology. This shows perigastric stranding and no extraluminal contrast media with suspected intramural component undermining the more distal stomach. Location and appearance remains suspicious  for malignant ulcer of the stomach. Continued correlation may be helpful. 2. Segmental thickening of the colon with surrounding stranding in a distribution that could be seen in the setting of ischemic colitis, difficult to exclude colonic neoplasm. Correlate with any history of hypotension that would explain watershed injury. Decreased perfusion due to infiltration of the transverse mesocolon from inflammation along the greater curvature of the stomach is another differential possibility. Lactate correlation may be helpful. Surgical and GI consultation may be helpful to guide further management. Follow-up endoscopic assessment could also be of benefit when the patient is able. 3. This thickened segment of the colon leads to upstream colonic distension up to 7 cm compatible with partial colonic obstruction or functional colonic obstruction. Close follow-up for this finding is suggested. 4. Areas of peritoneal nodularity may  be related to underlying inflammation. However, suggest at least close follow-up if not repeat assessment for neoplasm. 5. New small bilateral pleural effusions RIGHT greater than LEFT with RIGHT basilar airspace disease. 6. 3.2 cm infrarenal abdominal aortic aneurysm without change. Recommend follow-up every 3 years. Electronically Signed   By: Zetta Bills M.D.   On: 11/18/2022 18:45   DG Abd 1 View  Result Date: 11/18/2022 CLINICAL DATA:  Abdominal pain. EXAM: ABDOMEN - 1 VIEW COMPARISON:  CT evaluation from November 14, 2022. FINDINGS: Colonic dilation is increased since previous imaging with ahaustral appearance of the colon. Colonic distension reaches up to 8.5 cm in the midline upper abdomen with the transverse colon is quite distended. No signs of rectal gas. Soft tissues are grossly unremarkable. On limited assessment no acute skeletal process. EKG leads project over the abdomen. IMPRESSION: 1. Increasing colonic dilation with ahaustral appearance of the colon. Findings may reflect  developing colonic obstruction in the setting of colitis or ileus related to potential local inflammation incited by ulcer disease described on previous imaging. Close follow-up and GI consultation may be warranted. Stomach not well assessed on current imaging. If there is worsening pain could consider CT with oral contrast media as warranted. 2. No signs of rectal gas. Electronically Signed   By: Zetta Bills M.D.   On: 11/18/2022 11:07   DG Chest Port 1 View  Result Date: 11/18/2022 CLINICAL DATA:  A 62 year old female presents with abdominal pain. EXAM: PORTABLE CHEST 1 VIEW COMPARISON:  November 14, 2022. FINDINGS: EKG leads project over the chest. Cardiomediastinal contours and hilar structures are stable. Subtle RIGHT basilar interstitial and airspace disease and graded opacity. Granuloma in the LEFT lower lobe. No lobar consolidation or pneumothorax. On limited assessment no acute skeletal process with signs of prior injury to the RIGHT clavicle. IMPRESSION: Subtle RIGHT basilar interstitial and airspace disease and graded opacity. Findings could reflect small effusion and basilar airspace process perhaps atelectasis. Correlate with any signs of infection and suggest attention on follow-up. Electronically Signed   By: Zetta Bills M.D.   On: 11/18/2022 11:02   CT ABDOMEN PELVIS WO CONTRAST  Result Date: 11/14/2022 CLINICAL DATA:  Gastrointestinal bleeding, abdominal pain for 1 week, hematemesis, dark stools EXAM: CT ABDOMEN AND PELVIS WITHOUT CONTRAST TECHNIQUE: Multidetector CT imaging of the abdomen and pelvis was performed following the standard protocol without IV contrast. Unenhanced CT was performed per clinician order. Lack of IV contrast limits sensitivity and specificity, especially for evaluation of abdominal/pelvic solid viscera. Specifically, gastrointestinal bleeding cannot be evaluated without IV contrast. RADIATION DOSE REDUCTION: This exam was performed according to the  departmental dose-optimization program which includes automated exposure control, adjustment of the mA and/or kV according to patient size and/or use of iterative reconstruction technique. COMPARISON:  04/06/2022 FINDINGS: Lower chest: No acute pleural or parenchymal lung disease. Hepatobiliary: Unremarkable unenhanced appearance of the liver and gallbladder. Pancreas: Unremarkable unenhanced appearance. Spleen: Unremarkable unenhanced appearance. Adrenals/Urinary Tract: No urinary tract calculi or obstructive uropathy within either kidney. The adrenals and bladder are unremarkable. Stomach/Bowel: There is persistent abnormality of the gastric antrum and pylorus as seen on prior study. There is abnormal wall thickening throughout the distal antrum and pylorus, with evidence of focal ulceration along the greater curvature of the stomach in the region the gastric antrum, best seen on image 32 of coronal series 5. There is adjacent stranding within the mesenteric fat. High attenuation in this region and within the gastric lumen is nonspecific, but could reflect  blood products given history of hematemesis. Correlation with endoscopy is recommended if not previously performed. No bowel obstruction or ileus.  Normal retrocecal appendix. Vascular/Lymphatic: Diffuse atherosclerosis of the abdominal aorta is again noted. 3.1 cm infrarenal abdominal aortic aneurysm is again seen, unchanged. Evaluation of the vascular lumen is limited without IV contrast. No pathologic adenopathy within the abdomen or pelvis. Subcentimeter lymph nodes surrounding the distal stomach are nonspecific, largest measuring 8 mm in short axis reference image 27/2. Reproductive: Uterus and bilateral adnexa are unremarkable. Other: There is no free fluid or free intraperitoneal gas. No abdominal wall hernia. Musculoskeletal: No acute or destructive bony lesions. Reconstructed images demonstrate no additional findings. IMPRESSION: 1. Progressive gastric  antral ulcer, with adjacent mural thickening throughout the distal antrum and pylorus. Differential includes progressive peptic ulcer disease versus underlying mass and malignant ulceration. There is persistent adjacent fat stranding, without evidence of frank perforation or pneumoperitoneum. High attenuation material within the lumen of the stomach adjacent to the ulceration could reflect blood products and active hemorrhage. Correlation with endoscopy is recommended for further evaluation. 2. Nonspecific subcentimeter mesenteric lymph nodes surrounding the gastric antrum. 3. 3.1 cm infrarenal abdominal aortic aneurysm. Recommend follow-up every 3 years. Reference: J Am Coll Radiol E031985. 4.  Aortic Atherosclerosis (ICD10-I70.0). These results were called by telephone at the time of interpretation on 11/14/2022 at 5:03 pm to provider [facility] , who verbally acknowledged these results. Electronically Signed   By: Randa Ngo M.D.   On: 11/14/2022 17:09   DG Chest Portable 1 View  Result Date: 11/14/2022 CLINICAL DATA:  Abdominal pain, hematemesis EXAM: PORTABLE CHEST 1 VIEW COMPARISON:  12/28/2021 FINDINGS: Single frontal view of the chest demonstrates an unremarkable cardiac silhouette. No airspace disease, effusion, or pneumothorax. No acute bony abnormality. Chronic posttraumatic changes distal right clavicle. IMPRESSION: 1. No acute intrathoracic process. Electronically Signed   By: Randa Ngo M.D.   On: 11/14/2022 16:42    Microbiology: Recent Results (from the past 240 hour(s))  MRSA Next Gen by PCR, Nasal     Status: None   Collection Time: 11/14/22  6:39 PM   Specimen: Nasal Mucosa; Nasal Swab  Result Value Ref Range Status   MRSA by PCR Next Gen NOT DETECTED NOT DETECTED Final    Comment: (NOTE) The GeneXpert MRSA Assay (FDA approved for NASAL specimens only), is one component of a comprehensive MRSA colonization surveillance program. It is not intended to diagnose MRSA  infection nor to guide or monitor treatment for MRSA infections. Test performance is not FDA approved in patients less than 74 years old. Performed at [facility], [address] [place], [place] [address]      Labs: CBC: Recent Labs  Lab 11/18/22 0505 11/19/22 0943 11/21/22 0459 11/23/22 0345 11/24/22 0400  WBC 8.2 8.8 9.3 9.9 9.6  HGB 8.8* 8.7* 8.5* 8.0* 8.2*  HCT 27.2* 27.2* 27.0* 25.8* 26.2*  MCV 87.2 87.5 88.5 88.7 88.8  PLT 386 446* 482* 591* [id number]*   Basic Metabolic Panel: Recent Labs  Lab 11/18/22 0505 11/19/22 0431 11/20/22 0317 11/21/22 0459 11/23/22 0345 11/24/22 0400  NA 131* 132* 133* 130* 133* 132*  K 3.2* 3.9 3.9 3.7 3.5 3.4*  CL 95* 100 98 98 101 99  CO2 '26 26 25 22 22 24  '$ GLUCOSE 94 104* 102* 106* 108* 123*  BUN 5* 5* '8 6 12 11  '$ CREATININE 0.38* 0.39* 0.41* 0.51 0.56 0.61  CALCIUM 8.2* 8.5* 8.5* 8.3* 8.4* 8.6*  MG 1.6*  1.9  --  1.8 1.8 1.8  PHOS 3.5 3.8  --  4.1 3.3 3.4   Liver Function Tests: No results for input(s): "AST", "ALT", "ALKPHOS", "BILITOT", "PROT", "ALBUMIN" in the last 168 hours. No results for input(s): "LIPASE", "AMYLASE" in the last 168 hours. No results for input(s): "AMMONIA" in the last 168 hours. Cardiac Enzymes: No results for input(s): "CKTOTAL", "CKMB", "CKMBINDEX", "TROPONINI" in the last 168 hours. BNP (last 3 results) No results for input(s): "BNP" in the last 8760 hours. CBG: Recent Labs  Lab 11/21/22 0821 11/21/22 1206 11/21/22 1719 11/22/22 1146 11/22/22 1646  GLUCAP 106* 108* 106* 130* 92    Time spent: 35 minutes  Signed:  Val Riles  [facility] 11/24/2022 12:09 PM

## 2022-11-27 ENCOUNTER — Other Ambulatory Visit: Payer: Self-pay | Admitting: *Deleted

## 2022-11-27 DIAGNOSIS — D62 Acute posthemorrhagic anemia: Secondary | ICD-10-CM

## 2022-11-27 DIAGNOSIS — K922 Gastrointestinal hemorrhage, unspecified: Secondary | ICD-10-CM

## 2022-11-30 ENCOUNTER — Encounter: Payer: Self-pay | Admitting: Oncology

## 2022-11-30 ENCOUNTER — Inpatient Hospital Stay: Payer: Medicaid Other | Attending: Oncology

## 2022-11-30 ENCOUNTER — Inpatient Hospital Stay: Payer: Medicaid Other

## 2022-11-30 ENCOUNTER — Inpatient Hospital Stay (HOSPITAL_BASED_OUTPATIENT_CLINIC_OR_DEPARTMENT_OTHER): Payer: Medicaid Other | Admitting: Oncology

## 2022-11-30 VITALS — BP 116/80 | HR 90 | Temp 96.2°F | Resp 18 | Ht 62.0 in | Wt 106.2 lb

## 2022-11-30 DIAGNOSIS — D62 Acute posthemorrhagic anemia: Secondary | ICD-10-CM

## 2022-11-30 DIAGNOSIS — R112 Nausea with vomiting, unspecified: Secondary | ICD-10-CM | POA: Diagnosis not present

## 2022-11-30 DIAGNOSIS — F17210 Nicotine dependence, cigarettes, uncomplicated: Secondary | ICD-10-CM | POA: Diagnosis not present

## 2022-11-30 DIAGNOSIS — E785 Hyperlipidemia, unspecified: Secondary | ICD-10-CM | POA: Insufficient documentation

## 2022-11-30 DIAGNOSIS — K922 Gastrointestinal hemorrhage, unspecified: Secondary | ICD-10-CM

## 2022-11-30 DIAGNOSIS — Z880 Allergy status to penicillin: Secondary | ICD-10-CM | POA: Insufficient documentation

## 2022-11-30 DIAGNOSIS — K259 Gastric ulcer, unspecified as acute or chronic, without hemorrhage or perforation: Secondary | ICD-10-CM | POA: Diagnosis present

## 2022-11-30 DIAGNOSIS — D509 Iron deficiency anemia, unspecified: Secondary | ICD-10-CM | POA: Diagnosis not present

## 2022-11-30 DIAGNOSIS — Z79899 Other long term (current) drug therapy: Secondary | ICD-10-CM | POA: Insufficient documentation

## 2022-11-30 DIAGNOSIS — K257 Chronic gastric ulcer without hemorrhage or perforation: Secondary | ICD-10-CM | POA: Diagnosis not present

## 2022-11-30 DIAGNOSIS — Z886 Allergy status to analgesic agent status: Secondary | ICD-10-CM | POA: Insufficient documentation

## 2022-11-30 DIAGNOSIS — B9681 Helicobacter pylori [H. pylori] as the cause of diseases classified elsewhere: Secondary | ICD-10-CM | POA: Diagnosis not present

## 2022-11-30 DIAGNOSIS — Z7189 Other specified counseling: Secondary | ICD-10-CM

## 2022-11-30 DIAGNOSIS — R109 Unspecified abdominal pain: Secondary | ICD-10-CM | POA: Insufficient documentation

## 2022-11-30 LAB — CBC WITH DIFFERENTIAL/PLATELET
Abs Immature Granulocytes: 0.03 10*3/uL (ref 0.00–0.07)
Basophils Absolute: 0.1 10*3/uL (ref 0.0–0.1)
Basophils Relative: 1 %
Eosinophils Absolute: 0.1 10*3/uL (ref 0.0–0.5)
Eosinophils Relative: 1 %
HCT: 32.7 % — ABNORMAL LOW (ref 36.0–46.0)
Hemoglobin: 9.8 g/dL — ABNORMAL LOW (ref 12.0–15.0)
Immature Granulocytes: 0 %
Lymphocytes Relative: 22 %
Lymphs Abs: 1.6 10*3/uL (ref 0.7–4.0)
MCH: 28.1 pg (ref 26.0–34.0)
MCHC: 30 g/dL (ref 30.0–36.0)
MCV: 93.7 fL (ref 80.0–100.0)
Monocytes Absolute: 0.8 10*3/uL (ref 0.1–1.0)
Monocytes Relative: 11 %
Neutro Abs: 4.8 10*3/uL (ref 1.7–7.7)
Neutrophils Relative %: 65 %
Platelets: 672 10*3/uL — ABNORMAL HIGH (ref 150–400)
RBC: 3.49 MIL/uL — ABNORMAL LOW (ref 3.87–5.11)
RDW: 16.5 % — ABNORMAL HIGH (ref 11.5–15.5)
WBC: 7.3 10*3/uL (ref 4.0–10.5)
nRBC: 0 % (ref 0.0–0.2)

## 2022-11-30 NOTE — Progress Notes (Signed)
Patient has swelling  on left arm since last Tuesday when released from hospital.

## 2022-11-30 NOTE — Progress Notes (Signed)
Hematology/Oncology Consult note [facility]  [phone] [phone]  Patient Care Team: Pcp, No as PCP - General Efrain Sella, MD as Consulting Physician (Gastroenterology)   Name of the patient: Erika Downs  [id number]  11-Mar-1961   Date of visit: 11/30/22  Diagnosis-gastric ulcer secondary to H. pylori.  Malignancy not excluded yet  Chief complaint/ Reason for visit-posthospital discharge follow-up  Heme/Onc history: patient is a 61 year old female with a past medical history significant for hypertension and hyperlipidemia who presented to the ER with symptoms of nausea vomiting and abdominal pain.  CT scan showed progressive gastric antral ulcer with adjacent mural thickening throughout the distal antrum and pylorus.  Differential includes progressive peptic ulcer disease versus underlying mass and malignant ulceration.  Nonspecific subcentimeter mesenteric lymph node surrounding the gastric antrum.Patient had upper endoscopy on 11/05/2022 which showed nonbleeding cratered gastric ulcer with adherent clot.  33 mm x 30 mm in largest dimension.  Heaped up edges of the ulcer were noted suspicious for malignancy.  Biopsies were negative for malignancy.  Staining for H. pylori positive on biopsy and patient is currently on anti-H. pylori therapy.  Interval history-patient continues to report significant abdominal pain despite starting H. pylori treatment and being on PPI.  States that her pain is currently 9 out of 10.  She is unable to lay down comfortably because of pain.  ECOG PS- 2 Pain scale- 9   Review of systems- Review of Systems  Gastrointestinal:  Positive for abdominal pain.      Allergies  Allergen Reactions   Ibuprofen Hives and Swelling   Ivp Dye [Iodinated Contrast Media] Hives    PT states she was injected at [facility] and had hives   Penicillins Hives and Swelling    Has patient had a PCN reaction causing immediate  rash, facial/tongue/throat swelling, SOB or lightheadedness with hypotension: Yes Has patient had a PCN reaction causing severe rash involving mucus membranes or skin necrosis: No Has patient had a PCN reaction that required hospitalization No Has patient had a PCN reaction occurring within the last 10 years: No If all of the above answers are "NO", then may proceed with Cephalosporin use.      Past Medical History:  Diagnosis Date   Chest pain    Hyperlipidemia      Past Surgical History:  Procedure Laterality Date   BLADDER SUSPENSION     ESOPHAGOGASTRODUODENOSCOPY (EGD) WITH PROPOFOL N/A 11/15/2022   Procedure: ESOPHAGOGASTRODUODENOSCOPY (EGD) WITH PROPOFOL;  Surgeon: Toledo, Benay Pike, MD;  Location: [facility] ENDOSCOPY;  Service: Gastroenterology;  Laterality: N/A;   NOVASURE ABLATION      Social History   Socioeconomic History   Marital status: Single    Spouse name: Not on file   Number of children: Not on file   Years of education: Not on file   Highest education level: Not on file  Occupational History   Not on file  Tobacco Use   Smoking status: Every Day    Packs/day: 0.50    Types: Cigarettes   Smokeless tobacco: Never  Vaping Use   Vaping Use: Never used  Substance and Sexual Activity   Alcohol use: Not Currently   Drug use: No   Sexual activity: Not on file  Other Topics Concern   Not on file  Social History Narrative   Not on file   Social Determinants of Health   Financial Resource Strain: Not on file  Food Insecurity: No Food Insecurity (  11/16/2022)   Hunger Vital Sign    Worried About Running Out of Food in the Last Year: Never true    [place] in the Last Year: Never true  Transportation Needs: No Transportation Needs (11/16/2022)   PRAPARE - Hydrologist (Medical): No    Lack of Transportation (Non-Medical): No  Physical Activity: Not on file  Stress: Not on file  Social Connections: Not on file  Intimate  Partner Violence: Not At Risk (11/16/2022)   Humiliation, Afraid, Rape, and Kick questionnaire    Fear of Current or Ex-Partner: No    Emotionally Abused: No    Physically Abused: No    Sexually Abused: No    History reviewed. No pertinent family history.   Current Outpatient Medications:    acetaminophen (TYLENOL) 325 MG tablet, Take 2 tablets (650 mg total) by mouth every 8 (eight) hours as needed for mild pain, fever or headache (headache)., Disp: , Rfl:    clarithromycin (BIAXIN) 500 MG tablet, Take 1 tablet (500 mg total) by mouth every 12 (twelve) hours for 10 days., Disp: 20 tablet, Rfl: 0   cyanocobalamin 1000 MCG tablet, Take 1 tablet (1,000 mcg total) by mouth daily., Disp: 30 tablet, Rfl: 2   ferrous sulfate 325 (65 FE) MG EC tablet, Take 1 tablet (325 mg total) by mouth 2 (two) times daily., Disp: 60 tablet, Rfl: 2   magnesium oxide (MAG-OX) 400 (240 Mg) MG tablet, Take 1 tablet (400 mg total) by mouth daily., Disp: 30 tablet, Rfl: 0   metroNIDAZOLE (FLAGYL) 500 MG tablet, Take 1 tablet (500 mg total) by mouth every 12 (twelve) hours for 10 days., Disp: 20 tablet, Rfl: 0   pantoprazole (PROTONIX) 40 MG tablet, Take 1 tablet (40 mg total) by mouth daily., Disp: 30 tablet, Rfl: 11   sucralfate (CARAFATE) 1 g tablet, Take 1 tablet (1 g total) by mouth 4 (four) times daily -  with meals and at bedtime., Disp: 120 tablet, Rfl: 0   thiamine (VITAMIN B-1) 100 MG tablet, Take 1 tablet (100 mg total) by mouth daily., Disp: 30 tablet, Rfl: 0  Physical exam:  Vitals:   11/30/22 1038  BP: 116/80  Pulse: 90  Resp: 18  Temp: (!) 96.2 F (35.7 C)  TempSrc: Tympanic  SpO2: 96%  Weight: 106 lb 3.2 oz (48.2 kg)  Height: '5\' 2"'$  (1.575 m)   Physical Exam Constitutional:      Comments: Appears in mild distress due to pain  Cardiovascular:     Rate and Rhythm: Normal rate and regular rhythm.     Heart sounds: Normal heart sounds.  Pulmonary:     Effort: Pulmonary effort is normal.      Breath sounds: Normal breath sounds.  Abdominal:     General: Bowel sounds are normal.     Palpations: Abdomen is soft.     Comments: Tenderness to palpation in the epigastrium  Skin:    General: Skin is warm and dry.  Neurological:     Mental Status: She is alert and oriented to person, place, and time.         Latest Ref Rng & Units 11/24/2022    4:00 AM  CMP  Glucose 70 - 99 mg/dL 123   BUN 6 - 20 mg/dL 11   Creatinine 0.44 - 1.00 mg/dL 0.61   Sodium 135 - 145 mmol/L 132   Potassium 3.5 - 5.1 mmol/L 3.4   Chloride 98 -  111 mmol/L 99   CO2 22 - 32 mmol/L 24   Calcium 8.9 - 10.3 mg/dL 8.6       Latest Ref Rng & Units 11/30/2022   10:21 AM  CBC  WBC 4.0 - 10.5 K/uL 7.3   Hemoglobin 12.0 - 15.0 g/dL 9.8   Hematocrit 36.0 - 46.0 % 32.7   Platelets 150 - 400 K/uL 672     No images are attached to the encounter.  DG Abd 1 View  Result Date: 11/20/2022 CLINICAL DATA:  Abdominal pain, ileus EXAM: ABDOMEN - 1 VIEW COMPARISON:  CT 11/18/2022 FINDINGS: There is diffuse gaseous distension of bowel in the abdomen. Contrast material has progressed into the colon. No radiopaque calculi overlie the kidneys. No acute osseous abnormality. IMPRESSION: Diffuse gaseous distension of bowel with progression of oral contrast material into the colon, compatible with ileus. Electronically Signed   By: Maurine Simmering M.D.   On: 11/20/2022 13:45   CT ABDOMEN PELVIS WO CONTRAST  Result Date: 11/18/2022 CLINICAL DATA:  62 year old female presents for evaluation of abdominal pain and worsening colonic distension. EXAM: CT ABDOMEN AND PELVIS WITHOUT CONTRAST TECHNIQUE: Multidetector CT imaging of the abdomen and pelvis was performed following the standard protocol without IV contrast. RADIATION DOSE REDUCTION: This exam was performed according to the departmental dose-optimization program which includes automated exposure control, adjustment of the mA and/or kV according to patient size and/or use of  iterative reconstruction technique. COMPARISON:  November 14, 2022. FINDINGS: Lower chest: Small effusions bilaterally largest on the RIGHT with RIGHT basilar consolidation. Granuloma in the lingula. Hepatobiliary: Liver with smooth contours. No focal, suspicious lesion on noncontrast imaging with limited evaluation. No pericholecystic stranding. No gross biliary duct distension. Pancreas: Pancreas with normal contours, no signs of inflammation or peripancreatic fluid. Spleen: Normal. Adrenals/Urinary Tract: Adrenal glands are normal. Smooth contour the bilateral kidneys. No hydronephrosis. No perinephric stranding. No perivesical stranding. Ureters difficult to assess but are nondilated. Stomach/Bowel: No extravasation of oral contrast media beyond the confines of the stomach with large irregular ulcer crater extending beyond the expected gastric lumen or distorting the normal gastric lumen. This appears undermined and has AA component of intramural extension best seen on image 36/5 margins are irregular and heat. There is perigastric infiltration. Gastrohepatic lymph nodes with mild enlargement (image 19/2) 11 mm lymph node in the gastrohepatic ligament. Perigastric nodules are present along the greater curvature of the stomach. Small nodule present in the LEFT upper quadrant measures 6 mm (image 42/5). Nodule adjacent to the ulcer measuring 16 mm (image 37/5) Small bowel without signs of obstruction. Oral contrast passes into the distal small bowel just upstream from the terminal ileum. Appendix not visible but no secondary signs to suggest acute appendicitis. Colon is in fact dilated with dilation of the transverse colon up to 7 cm. At the level of the hepatic flexure and distal transverse colon there is colonic thickening most pronounced in the distal transverse colon just proximal to the splenic flexure with surrounding stranding (image 42/5) the colon was decompressed previously but perhaps thickened along  the descending portion of the colon which is not evident currently there is stranding extending from the gastric ulcer but substantial stranding extending to the distal transverse colon beyond what is seen extending towards other structures in the upper abdomen. Vascular/Lymphatic: Gastrohepatic nodes with slight enlargement. 3.2 cm infrarenal abdominal aortic aneurysm without change. No pelvic sidewall lymphadenopathy. Reproductive: Unremarkable to the extent evaluated. Other: No pneumoperitoneum or extravasation of contrast. Subtle  nodularity in the transverse mesocolon and about the lateral aspect of the stomach in LEFT upper quadrant is of uncertain significance. Musculoskeletal: No acute bone finding. No destructive bone process. Spinal degenerative changes. IMPRESSION: 1. Large irregular ulcer crater extends slightly beyond the confines of the expected wall of the stomach likely due to distortion of the stomach and shows no change compared to more remote imaging in terms of its morphology. This shows perigastric stranding and no extraluminal contrast media with suspected intramural component undermining the more distal stomach. Location and appearance remains suspicious for malignant ulcer of the stomach. Continued correlation may be helpful. 2. Segmental thickening of the colon with surrounding stranding in a distribution that could be seen in the setting of ischemic colitis, difficult to exclude colonic neoplasm. Correlate with any history of hypotension that would explain watershed injury. Decreased perfusion due to infiltration of the transverse mesocolon from inflammation along the greater curvature of the stomach is another differential possibility. Lactate correlation may be helpful. Surgical and GI consultation may be helpful to guide further management. Follow-up endoscopic assessment could also be of benefit when the patient is able. 3. This thickened segment of the colon leads to upstream colonic  distension up to 7 cm compatible with partial colonic obstruction or functional colonic obstruction. Close follow-up for this finding is suggested. 4. Areas of peritoneal nodularity may be related to underlying inflammation. However, suggest at least close follow-up if not repeat assessment for neoplasm. 5. New small bilateral pleural effusions RIGHT greater than LEFT with RIGHT basilar airspace disease. 6. 3.2 cm infrarenal abdominal aortic aneurysm without change. Recommend follow-up every 3 years. Electronically Signed   By: Zetta Bills M.D.   On: 11/18/2022 18:45   DG Abd 1 View  Result Date: 11/18/2022 CLINICAL DATA:  Abdominal pain. EXAM: ABDOMEN - 1 VIEW COMPARISON:  CT evaluation from November 14, 2022. FINDINGS: Colonic dilation is increased since previous imaging with ahaustral appearance of the colon. Colonic distension reaches up to 8.5 cm in the midline upper abdomen with the transverse colon is quite distended. No signs of rectal gas. Soft tissues are grossly unremarkable. On limited assessment no acute skeletal process. EKG leads project over the abdomen. IMPRESSION: 1. Increasing colonic dilation with ahaustral appearance of the colon. Findings may reflect developing colonic obstruction in the setting of colitis or ileus related to potential local inflammation incited by ulcer disease described on previous imaging. Close follow-up and GI consultation may be warranted. Stomach not well assessed on current imaging. If there is worsening pain could consider CT with oral contrast media as warranted. 2. No signs of rectal gas. Electronically Signed   By: Zetta Bills M.D.   On: 11/18/2022 11:07   DG Chest Port 1 View  Result Date: 11/18/2022 CLINICAL DATA:  A 62 year old female presents with abdominal pain. EXAM: PORTABLE CHEST 1 VIEW COMPARISON:  November 14, 2022. FINDINGS: EKG leads project over the chest. Cardiomediastinal contours and hilar structures are stable. Subtle RIGHT basilar  interstitial and airspace disease and graded opacity. Granuloma in the LEFT lower lobe. No lobar consolidation or pneumothorax. On limited assessment no acute skeletal process with signs of prior injury to the RIGHT clavicle. IMPRESSION: Subtle RIGHT basilar interstitial and airspace disease and graded opacity. Findings could reflect small effusion and basilar airspace process perhaps atelectasis. Correlate with any signs of infection and suggest attention on follow-up. Electronically Signed   By: Zetta Bills M.D.   On: 11/18/2022 11:02   CT ABDOMEN PELVIS WO  CONTRAST  Result Date: 11/14/2022 CLINICAL DATA:  Gastrointestinal bleeding, abdominal pain for 1 week, hematemesis, dark stools EXAM: CT ABDOMEN AND PELVIS WITHOUT CONTRAST TECHNIQUE: Multidetector CT imaging of the abdomen and pelvis was performed following the standard protocol without IV contrast. Unenhanced CT was performed per clinician order. Lack of IV contrast limits sensitivity and specificity, especially for evaluation of abdominal/pelvic solid viscera. Specifically, gastrointestinal bleeding cannot be evaluated without IV contrast. RADIATION DOSE REDUCTION: This exam was performed according to the departmental dose-optimization program which includes automated exposure control, adjustment of the mA and/or kV according to patient size and/or use of iterative reconstruction technique. COMPARISON:  04/06/2022 FINDINGS: Lower chest: No acute pleural or parenchymal lung disease. Hepatobiliary: Unremarkable unenhanced appearance of the liver and gallbladder. Pancreas: Unremarkable unenhanced appearance. Spleen: Unremarkable unenhanced appearance. Adrenals/Urinary Tract: No urinary tract calculi or obstructive uropathy within either kidney. The adrenals and bladder are unremarkable. Stomach/Bowel: There is persistent abnormality of the gastric antrum and pylorus as seen on prior study. There is abnormal wall thickening throughout the distal antrum  and pylorus, with evidence of focal ulceration along the greater curvature of the stomach in the region the gastric antrum, best seen on image 32 of coronal series 5. There is adjacent stranding within the mesenteric fat. High attenuation in this region and within the gastric lumen is nonspecific, but could reflect blood products given history of hematemesis. Correlation with endoscopy is recommended if not previously performed. No bowel obstruction or ileus.  Normal retrocecal appendix. Vascular/Lymphatic: Diffuse atherosclerosis of the abdominal aorta is again noted. 3.1 cm infrarenal abdominal aortic aneurysm is again seen, unchanged. Evaluation of the vascular lumen is limited without IV contrast. No pathologic adenopathy within the abdomen or pelvis. Subcentimeter lymph nodes surrounding the distal stomach are nonspecific, largest measuring 8 mm in short axis reference image 27/2. Reproductive: Uterus and bilateral adnexa are unremarkable. Other: There is no free fluid or free intraperitoneal gas. No abdominal wall hernia. Musculoskeletal: No acute or destructive bony lesions. Reconstructed images demonstrate no additional findings. IMPRESSION: 1. Progressive gastric antral ulcer, with adjacent mural thickening throughout the distal antrum and pylorus. Differential includes progressive peptic ulcer disease versus underlying mass and malignant ulceration. There is persistent adjacent fat stranding, without evidence of frank perforation or pneumoperitoneum. High attenuation material within the lumen of the stomach adjacent to the ulceration could reflect blood products and active hemorrhage. Correlation with endoscopy is recommended for further evaluation. 2. Nonspecific subcentimeter mesenteric lymph nodes surrounding the gastric antrum. 3. 3.1 cm infrarenal abdominal aortic aneurysm. Recommend follow-up every 3 years. Reference: J Am Coll Radiol [id number]. 4.  Aortic Atherosclerosis (ICD10-I70.0). These  results were called by telephone at the time of interpretation on 11/14/2022 at 5:03 pm to provider Avala , who verbally acknowledged these results. Electronically Signed   By: Randa Ngo M.D.   On: 11/14/2022 17:09   DG Chest Portable 1 View  Result Date: 11/14/2022 CLINICAL DATA:  Abdominal pain, hematemesis EXAM: PORTABLE CHEST 1 VIEW COMPARISON:  12/28/2021 FINDINGS: Single frontal view of the chest demonstrates an unremarkable cardiac silhouette. No airspace disease, effusion, or pneumothorax. No acute bony abnormality. Chronic posttraumatic changes distal right clavicle. IMPRESSION: 1. No acute intrathoracic process. Electronically Signed   By: Randa Ngo M.D.   On: 11/14/2022 16:42     Assessment and plan- Patient is a 62 y.o. female with H pylori positive gastric ulcer here for posthospital discharge follow-up  Gastric ulcer: Despite being on PPI and H. pylori treatment  patient continues to endorse significant abdominal pain.  I did reach out to [facility] so they can follow-up with her closely regarding her pain.  I am not comfortable prescribing any narcotics for her abdominal pain in the absence of any proven malignancy.  Given that there was concern for possible malignancy on CT scan, I did reach out to Dr. Alice Reichert to consider sooner follow-up than waiting for 3 months.  He is recommending EUS which will be coordinated by Mariea Clonts and hopefully patient can get that done next week.  Baseline CEA was normal  Iron deficiency anemia: Patient did receive 3 doses of Venofer 300 mg in the hospital.  Hemoglobin is improved to 9.8.  I will repeat CBC ferritin and iron studies in 2 and 4 months and see her back in 4 months.  B12 levels to be checked in 2 months.  I will see her sooner if malignancy is diagnosed on EUS  Patient said today in the clinic that she does not want to be resuscitated and did not want to go through CPR.  I have signed the DNR form for her.   Visit  Diagnosis 1. Acute blood loss anemia   2. Chronic gastric ulcer without hemorrhage and without perforation   3. Goals of care, counseling/discussion      Dr. Randa Evens, MD, MPH [facility] [id number] 11/30/2022 2:37 PM

## 2022-12-01 ENCOUNTER — Other Ambulatory Visit: Payer: Self-pay

## 2022-12-01 ENCOUNTER — Telehealth: Payer: Self-pay

## 2022-12-01 NOTE — Telephone Encounter (Signed)
EUS scheduled for December 24, 2022. First available at [facility] is December 29, 2022. She did ont want to go to [place]. Denies anticoagulants and diabetes. Copy of instructions mailed to home address.

## 2022-12-23 NOTE — Anesthesia Preprocedure Evaluation (Signed)
Anesthesia Evaluation  Patient identified by MRN, date of birth, ID band Patient awake    Reviewed: Allergy & Precautions, H&P , NPO status , Patient's Chart, lab work & pertinent test results, reviewed documented beta blocker date and time   History of Anesthesia Complications Negative for: history of anesthetic complications  Airway Mallampati: II  TM Distance: >3 FB Neck ROM: full  Mouth opening: Limited Mouth Opening  Dental  (+) Dental Advidsory Given, Edentulous Upper, Edentulous Lower   Pulmonary neg shortness of breath, neg COPD, neg recent URI, Current Smoker and Patient abstained from smoking.   Pulmonary exam normal breath sounds clear to auscultation       Cardiovascular Exercise Tolerance: Good negative cardio ROS Normal cardiovascular exam Rhythm:regular Rate:Normal     Neuro/Psych negative neurological ROS  negative psych ROS   GI/Hepatic Neg liver ROS,GERD  ,,  Endo/Other  negative endocrine ROS    Renal/GU negative Renal ROS  negative genitourinary   Musculoskeletal   Abdominal   Peds  Hematology  (+) Blood dyscrasia, anemia   Anesthesia Other Findings Past Medical History: No date: Chest pain No date: Hyperlipidemia   Reproductive/Obstetrics negative OB ROS                              Anesthesia Physical Anesthesia Plan  ASA: 3  Anesthesia Plan: General   Post-op Pain Management:    Induction: Intravenous  PONV Risk Score and Plan: 2 and Propofol infusion, TIVA and Treatment may vary due to age or medical condition  Airway Management Planned: Natural Airway and Nasal Cannula  Additional Equipment:   Intra-op Plan:   Post-operative Plan:   Informed Consent:      Dental Advisory Given  Plan Discussed with: Anesthesiologist, CRNA and Surgeon  Anesthesia Plan Comments:         Anesthesia Quick Evaluation

## 2022-12-24 ENCOUNTER — Ambulatory Visit
Admission: RE | Admit: 2022-12-24 | Discharge: 2022-12-24 | Disposition: A | Discharge status: Home / Self Care | Payer: Medicaid Other | Attending: Gastroenterology | Admitting: Gastroenterology

## 2022-12-24 ENCOUNTER — Ambulatory Visit: Payer: Medicaid Other | Admitting: Anesthesiology

## 2022-12-24 ENCOUNTER — Encounter
Admission: RE | Disposition: A | Discharge status: Home / Self Care | Payer: Self-pay | Source: Home / Self Care | Attending: Gastroenterology

## 2022-12-24 DIAGNOSIS — K259 Gastric ulcer, unspecified as acute or chronic, without hemorrhage or perforation: Secondary | ICD-10-CM | POA: Diagnosis present

## 2022-12-24 DIAGNOSIS — F17200 Nicotine dependence, unspecified, uncomplicated: Secondary | ICD-10-CM | POA: Insufficient documentation

## 2022-12-24 DIAGNOSIS — K2980 Duodenitis without bleeding: Secondary | ICD-10-CM | POA: Insufficient documentation

## 2022-12-24 DIAGNOSIS — K2950 Unspecified chronic gastritis without bleeding: Secondary | ICD-10-CM | POA: Diagnosis not present

## 2022-12-24 DIAGNOSIS — K219 Gastro-esophageal reflux disease without esophagitis: Secondary | ICD-10-CM | POA: Diagnosis not present

## 2022-12-24 DIAGNOSIS — D649 Anemia, unspecified: Secondary | ICD-10-CM | POA: Diagnosis not present

## 2022-12-24 HISTORY — PX: EUS: SHX5427

## 2022-12-24 SURGERY — UPPER ENDOSCOPIC ULTRASOUND (EUS) LINEAR
Anesthesia: General

## 2022-12-24 MED ORDER — PROPOFOL 10 MG/ML IV BOLUS
INTRAVENOUS | Status: AC
  Filled 2022-12-24: qty 40

## 2022-12-24 MED ORDER — PROPOFOL 10 MG/ML IV BOLUS
INTRAVENOUS | Status: DC | PRN
  Administered 2022-12-24: 180 ug/kg/min via INTRAVENOUS
  Administered 2022-12-24: 100 mg via INTRAVENOUS

## 2022-12-24 MED ORDER — LIDOCAINE HCL (PF) 2 % IJ SOLN
INTRAMUSCULAR | Status: AC
  Filled 2022-12-24: qty 5

## 2022-12-24 MED ORDER — LIDOCAINE HCL URETHRAL/MUCOSAL 2 % EX GEL
CUTANEOUS | Status: AC
  Filled 2022-12-24: qty 5

## 2022-12-24 MED ORDER — LIDOCAINE HCL (CARDIAC) PF 100 MG/5ML IV SOSY
PREFILLED_SYRINGE | INTRAVENOUS | Status: DC | PRN
  Administered 2022-12-24: 100 mg via INTRAVENOUS

## 2022-12-24 MED ORDER — PHENYLEPHRINE 80 MCG/ML (10ML) SYRINGE FOR IV PUSH (FOR BLOOD PRESSURE SUPPORT)
PREFILLED_SYRINGE | INTRAVENOUS | Status: AC
  Filled 2022-12-24: qty 10

## 2022-12-24 MED ORDER — PROPOFOL 10 MG/ML IV BOLUS
INTRAVENOUS | Status: AC
  Filled 2022-12-24: qty 20

## 2022-12-24 MED ORDER — SODIUM CHLORIDE 0.9 % IV SOLN: INTRAVENOUS | Status: DC

## 2022-12-24 MED ORDER — PHENYLEPHRINE HCL (PRESSORS) 10 MG/ML IV SOLN
INTRAVENOUS | Status: DC | PRN
  Administered 2022-12-24: 80 ug via INTRAVENOUS

## 2022-12-24 MED ORDER — DEXMEDETOMIDINE HCL IN NACL 80 MCG/20ML IV SOLN
INTRAVENOUS | Status: DC | PRN
  Administered 2022-12-24: 8 ug via BUCCAL

## 2022-12-24 NOTE — Anesthesia Postprocedure Evaluation (Signed)
Anesthesia Post Note  Patient: Erika Downs  Procedure(s) Performed: UPPER ENDOSCOPIC ULTRASOUND (EUS) LINEAR  Patient location during evaluation: Endoscopy Anesthesia Type: General Level of consciousness: awake and alert Pain management: pain level controlled Vital Signs Assessment: post-procedure vital signs reviewed and stable Respiratory status: spontaneous breathing, nonlabored ventilation, respiratory function stable and patient connected to nasal cannula oxygen Cardiovascular status: blood pressure returned to baseline and stable Postop Assessment: no apparent nausea or vomiting Anesthetic complications: no  No notable events documented.   Last Vitals:  Vitals:   12/24/22 1200 12/24/22 1353  BP: (!) 161/104 99/60  Pulse: 68 63  Resp: 16 14  Temp: (!) 36.1 C (!) 36 C  SpO2: 100% 98%    Last Pain:  Vitals:   12/24/22 1353  TempSrc: Temporal  PainSc: [place]

## 2022-12-24 NOTE — H&P (Signed)
  PRE-PROCEDURE HISTORY AND PHYSICAL   Erika Downs presents for her scheduled Procedure(s): UPPER ENDOSCOPIC ULTRASOUND (EUS) LINEAR.  The indication for the procedure(s) is Gastric ulcer, cannot rule out malignancy.  There have been no significant recent changes in the patient's medical status.  Past Medical History:  Diagnosis Date   Chest pain    Hyperlipidemia     Past Surgical History:  Procedure Laterality Date   BLADDER SUSPENSION     ESOPHAGOGASTRODUODENOSCOPY (EGD) WITH PROPOFOL N/A 11/15/2022   Procedure: ESOPHAGOGASTRODUODENOSCOPY (EGD) WITH PROPOFOL;  Surgeon: Toledo, Benay Pike, MD;  Location: [facility] ENDOSCOPY;  Service: Gastroenterology;  Laterality: N/A;   NOVASURE ABLATION      Allergies Allergies  Allergen Reactions   Ibuprofen Hives and Swelling   Ivp Dye [Iodinated Contrast Media] Hives    PT states she was injected at [facility] and had hives   Penicillins Hives and Swelling    Has patient had a PCN reaction causing immediate rash, facial/tongue/throat swelling, SOB or lightheadedness with hypotension: Yes Has patient had a PCN reaction causing severe rash involving mucus membranes or skin necrosis: No Has patient had a PCN reaction that required hospitalization No Has patient had a PCN reaction occurring within the last 10 years: No If all of the above answers are "NO", then may proceed with Cephalosporin use.     Medications acetaminophen, cyanocobalamin, ferrous sulfate, magnesium oxide, pantoprazole, sucralfate, and thiamine  Physical Examination  Body mass index is 20.34 kg/m. BP (!) 161/104   Pulse 68   Temp (!) 97 F (36.1 C) (Temporal)   Resp 16   Ht 5' 2.5" (1.588 m)   Wt 51.3 kg   SpO2 100%   BMI 20.34 kg/m  General:   Alert,  pleasant and cooperative in NAD Head:  Normocephalic and atraumatic. Neck:  Supple; no masses or thyromegaly. Lungs:  No audible wheezing.   Heart:  Regular rate and rhythm. Abdomen:  Soft, tender to palpation.  Reporting 8/10 pain. No rebound or guarding Neurologic:  Alert and  oriented x4;  grossly normal neurologically.  ASSESSMENT AND PLAN  Erika Downs has been evaluated and deemed appropriate to undergo the planned Procedure(s): UPPER ENDOSCOPIC ULTRASOUND (EUS) LINEAR.

## 2022-12-24 NOTE — Transfer of Care (Signed)
Immediate Anesthesia Transfer of Care Note  Patient: Erika Downs  Procedure(s) Performed: UPPER ENDOSCOPIC ULTRASOUND (EUS) LINEAR  Patient Location: Endoscopy Unit  Anesthesia Type:General  Level of Consciousness: drowsy  Airway & Oxygen Therapy: Patient Spontanous Breathing  Post-op Assessment: Report given to RN and Post -op Vital signs reviewed and stable  Post vital signs: Reviewed and stable  Last Vitals:  Vitals Value Taken Time  BP 99/60 12/24/22 1353  Temp 36 C 12/24/22 1353  Pulse 63 12/24/22 1353  Resp 14 12/24/22 1353  SpO2 98 % 12/24/22 1353    Last Pain:  Vitals:   12/24/22 1353  TempSrc: Temporal  PainSc:          Complications: No notable events documented.

## 2022-12-24 NOTE — Op Note (Signed)
[facility] Gastroenterology Patient Name: Erika Downs Procedure Date: 12/24/2022 11:22 AM MRN: [id number] Account #: [id number] Date of Birth: [birth year] Admit Type: Outpatient Age: 62 Room: [facility] ENDO ROOM 3 Gender: Female Note Status: Finalized Instrument Name: Radial EUS [id number] Procedure:             Upper EUS Indications:           Suspected gastric neoplasm Patient Profile:       Refer to note in patient chart for documentation of                         history and physical. Providers:             Lenetta Quaker. Cephas Darby, MD Referring MD:          Randa Evens (Referring MD), Benay Pike. Alice Reichert MD, MD                         (Referring MD) Medicines:             Monitored Anesthesia Care, Sedation Required                         Anesthesia Staff Assistance Complications:         No immediate complications. Procedure:             Pre-Anesthesia Assessment:                        - Prior to the procedure, a History and Physical was                         performed, and patient medications, allergies and                         sensitivities were reviewed. The patient's tolerance                         of previous anesthesia was reviewed.                        After obtaining informed consent, the endoscope was                         passed under direct vision. Throughout the procedure,                         the patient's blood pressure, pulse, and oxygen                         saturations were monitored continuously. The Endoscope                         was introduced through the mouth, and advanced to the                         stomach for ultrasound examination from the esophagus                         and stomach. Findings:      ENDOSCOPIC FINDING: :      The  examined esophagus was endoscopically normal.      One non-bleeding cratered gastric ulcer with no stigmata of bleeding was       found on the greater curvature of the stomach. The lesion was  15 mm in       largest dimension. Biopsies were taken with a cold forceps for histology.      One non-bleeding cratered gastric ulcer with no stigmata of bleeding was       found in the prepyloric region of the stomach. The lesion was 10 mm in       largest dimension. Biopsies were taken with a cold forceps for histology.      Diffuse moderate inflammation characterized by erosions was found in the       duodenal bulb.      ENDOSONOGRAPHIC FINDING: :      A hypoechoic ulcerated area was identified endosonographically in the       distal body of the stomach. The endosonographic borders were       poorly-defined. There was sonographic evidence suggesting invasion into       the serosa (Layer 5).      Two abnormal lymph nodes were visualized in the gastrohepatic ligament       (level 18). They were abutting each other and in total measured 7 mm by       14 mm in maximal cross-sectional diameter. The nodes were oval,       hypoechoic and had well defined margins. Fine needle biopsy was       performed. Color Doppler imaging was utilized prior to needle puncture       to confirm a lack of significant vascular structures within the needle       path. Three passes were made with the 25 gauge SharkCore biopsy needle       using a transgastric approach. A visible core of tissue was obtained.       Preliminary cytologic examination and touch preps were performed. The       cellularity of the specimen was adequate. Final cytology results are       pending.      No lymph nodes were visualized in the celiac region (level 20).      There was no sign of significant endosonographic abnormality in the left       lobe of the liver. No masses were identified. Impression:            EGD Impression:                        - Normal esophagus.                        - Non-bleeding gastric ulcer with no stigmata of                         bleeding. Biopsied.                        - Non-bleeding gastric ulcer  with no stigmata of                         bleeding. Biopsied.                        - Duodenitis.  EUS Impression:                        - An ulcer was found in the body of the stomach.                         Tissue was obtained from this exam, and results are                         pending. No clear mass lesion was visualized and it is                         unclear if this is a benign inflammatory ulcer that                         has eroded to the gastric wall with inflammatory                         changes or an ulcerated malignancy. This was staged T3                         N1 Mx by endosonographic criteria. The staging applies                         if malignancy is confirmed.                        - Two abnormal lymph nodes were visualized in the                         gastrohepatic ligament (level 18). Tissue was obtained                         from this exam, and results are pending. Fine needle                         biopsy performed.                        - No lymph nodes were visualized in the celiac region                         (level 20).                        - There was no evidence of significant pathology in                         the left lobe of the liver. Recommendation:        - Discharge patient to home (via wheelchair).                        - Await cytology results and await path results.                        - Return to referring physician as previously  scheduled.                        - Continue BID PPI                        - Avoid all tobacco and NSAIDs.                        - Complete treatment for H pylori.                        - If results are non diagnostic would recommend repeat                         EGD in approximately 50months to ensure healing.                        - The findings and recommendations were discussed with                         the patient and their  family. Attending Participation:      I personally performed the entire procedure. Dr. Lenetta Quaker. Cephas Darby, MD Lenetta Quaker. Cheney Gosch, MD 12/24/2022 2:14:50 PM This report has been signed electronically. Number of Addenda: 0 Note Initiated On: 12/24/2022 11:22 AM Estimated Blood Loss:  Estimated blood loss was minimal.      [facility]

## 2022-12-25 LAB — CYTOLOGY - NON PAP

## 2022-12-28 ENCOUNTER — Encounter: Payer: Self-pay | Admitting: Gastroenterology

## 2022-12-28 ENCOUNTER — Other Ambulatory Visit: Payer: Self-pay

## 2022-12-28 LAB — SURGICAL PATHOLOGY

## 2023-01-04 ENCOUNTER — Telehealth: Payer: Self-pay

## 2023-01-04 NOTE — Telephone Encounter (Signed)
Called and provided results of EUS biopsies to Erika Downs. She was instructed to keep her upcoming appointments with Dr. Norma Fredrickson and Dr. Smith Robert. She verbalized understanding.  DIAGNOSIS:  A. STOMACH, DISTAL BODY; COLD BIOPSY:  - GASTRIC OXYNTIC MUCOSA WITH REACTIVE FOVEOLAR HYPERPLASIA AND CHRONIC  GASTRITIS WITH FOCAL ACTIVE MUCOSAL INFLAMMATION.  - NEGATIVE FOR H. PYLORI, DYSPLASIA, AND MALIGNANCY.   B. STOMACH, PYLORUS; COLD BIOPSY:  - GASTRIC ANTRAL MUCOSA DISPLAYING CHANGES SUGGESTIVE OF HEALING MUCOSAL  INJURY, WITH INTESTINAL METAPLASIA, AND CHRONIC GASTRITIS WITH FOCAL  ACTIVITY.  - SEPARATE FRAGMENTS OF FIBRINOPURULENT ULCER DEBRIS.  - NEGATIVE FOR DYSPLASIA AND MALIGNANCY.

## 2023-01-19 ENCOUNTER — Emergency Department: Payer: Medicaid Other

## 2023-01-19 ENCOUNTER — Emergency Department
Admission: EM | Admit: 2023-01-19 | Discharge: 2023-01-19 | Disposition: A | Discharge status: Home / Self Care | Payer: Medicaid Other | Attending: Emergency Medicine | Admitting: Emergency Medicine

## 2023-01-19 ENCOUNTER — Other Ambulatory Visit: Payer: Self-pay

## 2023-01-19 DIAGNOSIS — N814 Uterovaginal prolapse, unspecified: Secondary | ICD-10-CM | POA: Insufficient documentation

## 2023-01-19 DIAGNOSIS — K279 Peptic ulcer, site unspecified, unspecified as acute or chronic, without hemorrhage or perforation: Secondary | ICD-10-CM | POA: Insufficient documentation

## 2023-01-19 DIAGNOSIS — R1084 Generalized abdominal pain: Secondary | ICD-10-CM

## 2023-01-19 DIAGNOSIS — R109 Unspecified abdominal pain: Secondary | ICD-10-CM | POA: Diagnosis present

## 2023-01-19 LAB — CBC
HCT: 44 % (ref 36.0–46.0)
Hemoglobin: 13.8 g/dL (ref 12.0–15.0)
MCH: 29.7 pg (ref 26.0–34.0)
MCHC: 31.4 g/dL (ref 30.0–36.0)
MCV: 94.8 fL (ref 80.0–100.0)
Platelets: 332 10*3/uL (ref 150–400)
RBC: 4.64 MIL/uL (ref 3.87–5.11)
RDW: 14.9 % (ref 11.5–15.5)
WBC: 9.4 10*3/uL (ref 4.0–10.5)
nRBC: 0 % (ref 0.0–0.2)

## 2023-01-19 LAB — COMPREHENSIVE METABOLIC PANEL
ALT: 14 U/L (ref 0–44)
AST: 24 U/L (ref 15–41)
Albumin: 3.7 g/dL (ref 3.5–5.0)
Alkaline Phosphatase: 82 U/L (ref 38–126)
Anion gap: 11 (ref 5–15)
BUN: 20 mg/dL (ref 6–20)
CO2: 26 mmol/L (ref 22–32)
Calcium: 9.4 mg/dL (ref 8.9–10.3)
Chloride: 100 mmol/L (ref 98–111)
Creatinine, Ser: 0.48 mg/dL (ref 0.44–1.00)
GFR, Estimated: >60 mL/min (ref >60)
Glucose, Bld: 116 mg/dL — ABNORMAL HIGH (ref 70–99)
Potassium: 4.4 mmol/L (ref 3.5–5.1)
Sodium: 137 mmol/L (ref 135–145)
Total Bilirubin: 0.3 mg/dL (ref 0.3–1.2)
Total Protein: 7.3 g/dL (ref 6.5–8.1)

## 2023-01-19 LAB — URINALYSIS, ROUTINE W REFLEX MICROSCOPIC
Bilirubin Urine: NEGATIVE
Glucose, UA: NEGATIVE mg/dL
Hgb urine dipstick: NEGATIVE
Ketones, ur: NEGATIVE mg/dL
Nitrite: NEGATIVE
Protein, ur: NEGATIVE mg/dL
Specific Gravity, Urine: 1.012 (ref 1.005–1.030)
pH: 7 (ref 5.0–8.0)

## 2023-01-19 LAB — LIPASE, BLOOD: Lipase: 30 U/L (ref 11–51)

## 2023-01-19 MED ORDER — LACTATED RINGERS IV BOLUS
1000.0000 mL | Freq: Once | INTRAVENOUS | Status: AC
  Administered 2023-01-19: 1000 mL via INTRAVENOUS

## 2023-01-19 MED ORDER — SUCRALFATE 1 G PO TABS: 1.0000 g | ORAL_TABLET | Freq: Three times a day (TID) | ORAL | 1 refills | Status: DC

## 2023-01-19 MED ORDER — SUCRALFATE 1 G PO TABS
1.0000 g | ORAL_TABLET | Freq: Once | ORAL | Status: AC
  Administered 2023-01-19: 1 g via ORAL
  Filled 2023-01-19: qty 1

## 2023-01-19 MED ORDER — MORPHINE SULFATE (PF) 4 MG/ML IV SOLN
4.0000 mg | Freq: Once | INTRAVENOUS | Status: AC
  Administered 2023-01-19: 4 mg via INTRAVENOUS
  Filled 2023-01-19: qty 1

## 2023-01-19 MED ORDER — ONDANSETRON HCL 4 MG/2ML IJ SOLN
4.0000 mg | Freq: Once | INTRAMUSCULAR | Status: AC
  Administered 2023-01-19: 4 mg via INTRAVENOUS
  Filled 2023-01-19: qty 2

## 2023-01-19 NOTE — ED Provider Notes (Signed)
[facility] Provider Note    Event Date/Time   First MD Initiated Contact with Patient 01/19/23 2018     (approximate)   History   Chief Complaint Abdominal Pain   HPI  Erika Downs is a 62 y.o. female with past medical history of hyperlipidemia, anemia, and peptic ulcer disease who presents to the ED complaining of abdominal pain.  Patient reports that over the past 2 to 3 days she has been dealing with increasing pain across the entirety of her abdomen.  She describes it as similar to when she was admitted for pain related to multiple gastric ulcers 2 months ago.  She states she has been taking a PPI since then, but recently ran out of the Carafate.  She has not been taking any medication for pain, reports the pain has become severe and unbearable.  She denies any associated nausea, vomiting, diarrhea, or blood in her stool.  She did have recent biopsies performed that were negative for malignancy.  Patient also reports that her pelvic organs have intermittently been falling out of her vagina for the past couple of weeks.  She states she is usually able to push them back in but has not been able to today.  She denies any difficulty urinating.     Physical Exam   Triage Vital Signs: ED Triage Vitals  Enc Vitals Group     BP 01/19/23 1825 (!) 179/93     Pulse Rate 01/19/23 1825 85     Resp 01/19/23 1825 18     Temp 01/19/23 1825 97.8 F (36.6 C)     Temp Source 01/19/23 1825 Oral     SpO2 01/19/23 1825 99 %     Weight 01/19/23 1825 117 lb (53.1 kg)     Height 01/19/23 1825 5' 2.5" (1.588 m)     Head Circumference --      Peak Flow --      Pain Score 01/19/23 1823 10     Pain Loc --      Pain Edu? --      Excl. in GC? --     Most recent vital signs: Vitals:   01/19/23 2145 01/19/23 2300  BP: (!) 164/88 (!) 158/74  Pulse: 84 80  Resp: 17 17  Temp: 98 F (36.7 C) 98.1 F (36.7 C)  SpO2: 98% 99%    Constitutional: Alert and  oriented. Eyes: Conjunctivae are normal. Head: Atraumatic. Nose: No congestion/rhinnorhea. Mouth/Throat: Mucous membranes are moist.  Cardiovascular: Normal rate, regular rhythm. Grossly normal heart sounds.  2+ radial pulses bilaterally. Respiratory: Normal respiratory effort.  No retractions. Lungs CTAB. Gastrointestinal: Soft and diffusely tender to palpation with no rebound or guarding. No distention. Genitourinary: Pelvic exam with partial uterine prolapse, cervix located just at the vaginal introitus. Musculoskeletal: No lower extremity tenderness nor edema.  Neurologic:  Normal speech and language. No gross focal neurologic deficits are appreciated.    ED Results / Procedures / Treatments   Labs (all labs ordered are listed, but only abnormal results are displayed) Labs Reviewed  COMPREHENSIVE METABOLIC PANEL - Abnormal; Notable for the following components:      Result Value   Glucose, Bld 116 (*)    All other components within normal limits  URINALYSIS, ROUTINE W REFLEX MICROSCOPIC - Abnormal; Notable for the following components:   Color, Urine YELLOW (*)    APPearance CLEAR (*)    Leukocytes,Ua TRACE (*)    Bacteria, UA RARE (*)  All other components within normal limits  LIPASE, BLOOD  CBC   RADIOLOGY CT abdomen/pelvis reviewed and interpreted by me showing similar ulceration at the stomach, no free air noted.  PROCEDURES:  Critical Care performed: No  Procedures   MEDICATIONS ORDERED IN ED: Medications  morphine (PF) 4 MG/ML injection 4 mg (4 mg Intravenous Given 01/19/23 2102)  ondansetron (ZOFRAN) injection 4 mg (4 mg Intravenous Given 01/19/23 2102)  lactated ringers bolus 1,000 mL (0 mLs Intravenous Stopped 01/19/23 2229)  sucralfate (CARAFATE) tablet 1 g (1 g Oral Given 01/19/23 2229)     IMPRESSION / MDM / ASSESSMENT AND PLAN / ED COURSE  I reviewed the triage vital signs and the nursing notes.                              62 y.o. female with  past medical history of PUD, hyperlipidemia, and anemia who presents to the ED complaining of 2 to 3 days of increasing diffuse abdominal pain similar to prior issues with large gastric ulcer.  Patient's presentation is most consistent with acute presentation with potential threat to life or bodily function.  Differential diagnosis includes, but is not limited to, perforated gastric ulcer, worsening ulcer, bleeding ulcer, dehydration, shot abnormality, AKI, UTI, uterine prolapse, pancreatitis, hepatitis, biliary colic, cholecystitis.  Patient uncomfortable appearing but in no acute distress, vital signs are unremarkable.  She is diffusely tender to palpation on exam, but labs are reassuring without significant anemia, leukocytosis, electrolyte abnormality, or AKI.  LFTs and lipase are unremarkable, urinalysis shows no signs of infection.  CT imaging was performed and read demonstrates large gastric ulcer, although appears to be improving from prior imaging.  Recent endoscopic ultrasound results were reviewed, biopsies performed and negative for malignancy.  No evidence of bleeding at this time.  Patient does appear to have uterine prolapse but this was easily reduced.  Findings reviewed with Dr. Mia Creek of GI, who agrees patient appropriate for outpatient follow-up and we will restart her on Carafate, continue PPI.  Patient's pain improved on reassessment and she is appropriate for outpatient follow-up, was counseled to return to the ED for new or worsening symptoms.  Patient agrees with plan.      FINAL CLINICAL IMPRESSION(S) / ED DIAGNOSES   Final diagnoses:  PUD (peptic ulcer disease)  Generalized abdominal pain  Uterine prolapse     Rx / DC Orders   ED Discharge Orders          Ordered    sucralfate (CARAFATE) 1 g tablet  3 times daily with meals & bedtime       Note to Pharmacy: ok to change to tabs per Dr. Lucianne Muss secure EPIC chat 11/24/22 12:15pm AMP   01/19/23 2256              Note:  This document was prepared using Dragon voice recognition software and may include unintentional dictation errors.   Chesley Noon, MD 01/19/23 2303

## 2023-01-19 NOTE — ED Triage Notes (Signed)
Pt comes with c/o belly pain all over. Pt stats hx of ulcers. Pt states it is all over. Pt states she has something hanging out of her vagina  last few days.

## 2023-01-19 NOTE — ED Notes (Signed)
Unable to obtain blood after x2 sticks. Pt stated," they used ultrasound to get blood two weeks ago I was here." Lab tech was called [id number].

## 2023-02-01 ENCOUNTER — Inpatient Hospital Stay: Payer: Medicaid Other | Attending: Oncology

## 2023-04-02 ENCOUNTER — Other Ambulatory Visit: Payer: Self-pay

## 2023-04-02 ENCOUNTER — Ambulatory Visit: Payer: Self-pay | Admitting: Oncology

## 2023-04-14 ENCOUNTER — Inpatient Hospital Stay: Payer: Medicaid Other

## 2023-04-14 ENCOUNTER — Inpatient Hospital Stay: Payer: Medicaid Other | Admitting: Oncology

## 2023-05-10 ENCOUNTER — Encounter: Payer: Self-pay | Admitting: Oncology

## 2023-05-10 ENCOUNTER — Inpatient Hospital Stay: Payer: Medicaid Other | Admitting: Oncology

## 2023-05-10 ENCOUNTER — Inpatient Hospital Stay: Payer: Medicaid Other | Attending: Oncology

## 2023-09-06 ENCOUNTER — Emergency Department
Admission: EM | Admit: 2023-09-06 | Discharge: 2023-09-06 | Disposition: A | Discharge status: Home / Self Care | Payer: Medicaid Other | Attending: Emergency Medicine | Admitting: Emergency Medicine

## 2023-09-06 ENCOUNTER — Encounter: Payer: Self-pay | Admitting: Emergency Medicine

## 2023-09-06 ENCOUNTER — Other Ambulatory Visit: Payer: Self-pay

## 2023-09-06 DIAGNOSIS — F17200 Nicotine dependence, unspecified, uncomplicated: Secondary | ICD-10-CM | POA: Diagnosis present

## 2023-09-06 DIAGNOSIS — K2900 Acute gastritis without bleeding: Secondary | ICD-10-CM | POA: Diagnosis not present

## 2023-09-06 DIAGNOSIS — Z8711 Personal history of peptic ulcer disease: Secondary | ICD-10-CM | POA: Insufficient documentation

## 2023-09-06 LAB — CBC WITH DIFFERENTIAL/PLATELET
Abs Immature Granulocytes: 0.01 10*3/uL (ref 0.00–0.07)
Basophils Absolute: 0 10*3/uL (ref 0.0–0.1)
Basophils Relative: 0 %
Eosinophils Absolute: 0 10*3/uL (ref 0.0–0.5)
Eosinophils Relative: 0 %
HCT: 48.3 % — ABNORMAL HIGH (ref 36.0–46.0)
Hemoglobin: 15.9 g/dL — ABNORMAL HIGH (ref 12.0–15.0)
Immature Granulocytes: 0 %
Lymphocytes Relative: 24 %
Lymphs Abs: 1.8 10*3/uL (ref 0.7–4.0)
MCH: 30.6 pg (ref 26.0–34.0)
MCHC: 32.9 g/dL (ref 30.0–36.0)
MCV: 93.1 fL (ref 80.0–100.0)
Monocytes Absolute: 0.7 10*3/uL (ref 0.1–1.0)
Monocytes Relative: 10 %
Neutro Abs: 5 10*3/uL (ref 1.7–7.7)
Neutrophils Relative %: 66 %
Platelets: 303 10*3/uL (ref 150–400)
RBC: 5.19 MIL/uL — ABNORMAL HIGH (ref 3.87–5.11)
RDW: 13.7 % (ref 11.5–15.5)
WBC: 7.6 10*3/uL (ref 4.0–10.5)
nRBC: 0 % (ref 0.0–0.2)

## 2023-09-06 LAB — COMPREHENSIVE METABOLIC PANEL
ALT: 13 U/L (ref 0–44)
AST: 18 U/L (ref 15–41)
Albumin: 4.3 g/dL (ref 3.5–5.0)
Alkaline Phosphatase: 98 U/L (ref 38–126)
Anion gap: 14 (ref 5–15)
BUN: 20 mg/dL (ref 6–20)
CO2: 19 mmol/L — ABNORMAL LOW (ref 22–32)
Calcium: 9.7 mg/dL (ref 8.9–10.3)
Chloride: 106 mmol/L (ref 98–111)
Creatinine, Ser: 0.66 mg/dL (ref 0.44–1.00)
GFR, Estimated: >60 mL/min (ref >60)
Glucose, Bld: 114 mg/dL — ABNORMAL HIGH (ref 70–99)
Potassium: 3.7 mmol/L (ref 3.5–5.1)
Sodium: 139 mmol/L (ref 135–145)
Total Bilirubin: 0.5 mg/dL (ref <1.2)
Total Protein: 8.2 g/dL — ABNORMAL HIGH (ref 6.5–8.1)

## 2023-09-06 LAB — URINALYSIS, ROUTINE W REFLEX MICROSCOPIC
Glucose, UA: NEGATIVE mg/dL
Ketones, ur: 5 mg/dL — AB
Leukocytes,Ua: NEGATIVE
Nitrite: NEGATIVE
Protein, ur: >=300 mg/dL — AB
RBC / HPF: >50 RBC/hpf (ref 0–5)
Specific Gravity, Urine: 1.04 — ABNORMAL HIGH (ref 1.005–1.030)
pH: 5 (ref 5.0–8.0)

## 2023-09-06 LAB — LIPASE, BLOOD: Lipase: 27 U/L (ref 11–51)

## 2023-09-06 MED ORDER — SUCRALFATE 1 G PO TABS: 1.0000 g | ORAL_TABLET | Freq: Three times a day (TID) | ORAL | 1 refills | Status: AC

## 2023-09-06 MED ORDER — ALUM & MAG HYDROXIDE-SIMETH 200-200-20 MG/5 ML NICU TOPICAL: 1.0000 | TOPICAL | 2 refills | Status: AC | PRN

## 2023-09-06 MED ORDER — ALUM & MAG HYDROXIDE-SIMETH 200-200-20 MG/5 ML NICU TOPICAL: 1.0000 | TOPICAL | 2 refills | Status: DC | PRN

## 2023-09-06 MED ORDER — PANTOPRAZOLE SODIUM 40 MG IV SOLR
40.0000 mg | Freq: Once | INTRAVENOUS | Status: AC
  Administered 2023-09-06: 40 mg via INTRAVENOUS
  Filled 2023-09-06: qty 10

## 2023-09-06 MED ORDER — PANTOPRAZOLE SODIUM 40 MG PO TBEC: 40.0000 mg | DELAYED_RELEASE_TABLET | Freq: Every day | ORAL | 3 refills | Status: DC

## 2023-09-06 MED ORDER — ONDANSETRON HCL 4 MG/2ML IJ SOLN
4.0000 mg | Freq: Once | INTRAMUSCULAR | Status: AC
  Administered 2023-09-06: 4 mg via INTRAVENOUS
  Filled 2023-09-06: qty 2

## 2023-09-06 MED ORDER — MORPHINE SULFATE (PF) 4 MG/ML IV SOLN
4.0000 mg | Freq: Once | INTRAVENOUS | Status: AC
  Administered 2023-09-06: 4 mg via INTRAVENOUS
  Filled 2023-09-06: qty 1

## 2023-09-06 MED ORDER — OXYCODONE HCL 5 MG PO TABS: 5.0000 mg | ORAL_TABLET | Freq: Four times a day (QID) | ORAL | 0 refills | Status: DC | PRN

## 2023-09-06 NOTE — ED Provider Notes (Signed)
[facility] Provider Note    Event Date/Time   First MD Initiated Contact with Patient 09/06/23 1132     (approximate)   History   Abdominal Pain   HPI  Erika Downs is a 62 y.o. female   presents to the ED with complaint of abdominal pain that started recently.  Patient states that in the last several days she has been vomiting blood and has a known history of ulcers.  She has not taken her Carafate or Pepcid in the last 6 to 7 weeks as she has ran out of medication and not able to see her PCP until January.  Patient reports that when she is having pain she tends to smoke much more than her reported half pack per day.  She denies use of alcohol.  Patient denies melena, vomiting, fever or chills.  No prior history of hypertension.      Physical Exam   Triage Vital Signs: ED Triage Vitals  Encounter Vitals Group     BP 09/06/23 1055 (!) 171/142     Systolic BP Percentile --      Diastolic BP Percentile --      Pulse Rate 09/06/23 1052 83     Resp 09/06/23 1052 18     Temp 09/06/23 1052 97.9 F (36.6 C)     Temp Source 09/06/23 1052 Oral     SpO2 09/06/23 1052 96 %     Weight 09/06/23 1053 122 lb (55.3 kg)     Height 09/06/23 1053 5\' 2"  (1.575 m)     Head Circumference --      Peak Flow --      Pain Score 09/06/23 1052 10     Pain Loc --      Pain Education --      Exclude from Growth Chart --     Most recent vital signs: Vitals:   09/06/23 1230 09/06/23 1300  BP: (!) 154/79 (!) 146/80  Pulse: 73 63  Resp:    Temp:    SpO2: 93% 94%     General: Awake, no distress.  CV:  Good peripheral perfusion.  Regular rate and rhythm. Resp:  Normal effort.  Lungs clear bilaterally. Abd:  No distention.  Soft, flat, diffuse tenderness.  Bowel sounds normoactive x 4 quadrants. Other:  Rectal exam without external hemorrhoids.  Brown stool.  Hemoccult was negative.   ED Results / Procedures / Treatments   Labs (all labs ordered are listed,  but only abnormal results are displayed) Labs Reviewed  COMPREHENSIVE METABOLIC PANEL - Abnormal; Notable for the following components:      Result Value   CO2 19 (*)    Glucose, Bld 114 (*)    Total Protein 8.2 (*)    All other components within normal limits  CBC WITH DIFFERENTIAL/PLATELET - Abnormal; Notable for the following components:   RBC 5.19 (*)    Hemoglobin 15.9 (*)    HCT 48.3 (*)    All other components within normal limits  URINALYSIS, ROUTINE W REFLEX MICROSCOPIC - Abnormal; Notable for the following components:   Color, Urine AMBER (*)    APPearance HAZY (*)    Specific Gravity, Urine 1.040 (*)    Hgb urine dipstick MODERATE (*)    Bilirubin Urine SMALL (*)    Ketones, ur 5 (*)    Protein, ur >=300 (*)    Bacteria, UA RARE (*)    All other components within normal limits  LIPASE, BLOOD     PROCEDURES:  Critical Care performed:   Procedures   MEDICATIONS ORDERED IN ED: Medications  morphine (PF) 4 MG/ML injection 4 mg (4 mg Intravenous Given 09/06/23 1218)  ondansetron (ZOFRAN) injection 4 mg (4 mg Intravenous Given 09/06/23 1218)  pantoprazole (PROTONIX) injection 40 mg (40 mg Intravenous Given 09/06/23 1218)     IMPRESSION / MDM / ASSESSMENT AND PLAN / ED COURSE  I reviewed the triage vital signs and the nursing notes.   Differential diagnosis includes, but is not limited to, gastritis, exacerbation of gastric ulcer, upper GI bleed, lower GI bleed considered.  62 year old female presents to the ED with complaint of gastric pain with bright red blood with vomiting.  Patient states that she does have a history of ulcers and has not been on her medication for more than 8 weeks.  Patient states that she has an appointment with her PCP in January but cannot be seen before that.  She needs prescriptions refilled for Protonix and Carafate.  Patient was given Pepcid IV while in the ED and a prescription for her medication was sent to the pharmacy.  I spoke to  her about her microscopic hematuria and without urinary symptoms patient agrees to have this also checked for her visit with her PCP.  Denies any history of prior stones, flank pain, nausea or vomiting.  Vital signs are stable and patient was discharged with instructions to return to the emergency department if any severe worsening of her symptoms.      Patient's presentation is most consistent with exacerbation of chronic illness.  FINAL CLINICAL IMPRESSION(S) / ED DIAGNOSES   Final diagnoses:  Other acute gastritis, presence of bleeding unspecified  History of stomach ulcers     Rx / DC Orders   ED Discharge Orders          Ordered    sucralfate (CARAFATE) 1 g tablet  3 times daily with meals & bedtime       Note to Pharmacy: ok to change to tabs per Dr. Lucianne Muss secure EPIC chat 11/24/22 12:15pm AMP   09/06/23 1258    pantoprazole (PROTONIX) 40 MG tablet  Daily        09/06/23 1258    alum & mag hydroxide-simeth (MAALOX/MYLANTA) 200-200-20 MG/5 SUSP  As needed,   Status:  Discontinued        09/06/23 1258    alum & mag hydroxide-simeth (MAALOX/MYLANTA) 200-200-20 MG/5 SUSP  As needed        09/06/23 1259    oxyCODONE (OXY IR/ROXICODONE) 5 MG immediate release tablet  Every 6 hours PRN        09/06/23 1308             Note:  This document was prepared using Dragon voice recognition software and may include unintentional dictation errors.   Tommi Rumps, PA-C 09/06/23 1319    Jene Every, MD 09/06/23 [phone]

## 2023-09-06 NOTE — Discharge Instructions (Signed)
Keep your appointment with your doctor as scheduled in January.  Prescription for the medications that you have been taken was sent to the pharmacy along with pain medication for a short period of time.  Do not drive or operate machinery while taking the pain medication.  Also decrease smoking which will make your ulcer much worse.  Avoid spicy and acidic foods which will also aggravate your ulcer.

## 2023-09-06 NOTE — ED Triage Notes (Signed)
Patient to ED via Pov for abd pain and vomiting blood. Started this AM. Has known ulcers. Unable to get in with PCP till January. States she had diarrhea for the past few months. Had uterus removed in October.

## 2023-09-06 NOTE — ED Provider Triage Note (Signed)
Emergency Medicine Provider Triage Evaluation Note  JOEI RATHMANN , a 62 y.o. female  was evaluated in triage.  Pt complains of epigastric pain, vomiting. Patient reports history of stomach ulcers. She used to have medication for this but has now run out and does not have primary care. She says that since her hospital admission in February she has had diarrhea multiple times a day.  Review of Systems  Positive: Epigastric pain, vomiting, diarrhea Negative: Fevers, urinary symptoms   Physical Exam  There were no vitals taken for this visit. Gen:   Awake, no distress   Resp:  Normal effort  MSK:   Moves extremities without difficulty  Other:    Medical Decision Making  Medically screening exam initiated at 10:50 AM.  Appropriate orders placed.  DONICE KINNICK was informed that the remainder of the evaluation will be completed by another provider, this initial triage assessment does not replace that evaluation, and the importance of remaining in the ED until their evaluation is complete.     Cameron Ali, PA-C 09/06/23 1053

## 2023-12-08 ENCOUNTER — Encounter: Payer: Self-pay | Admitting: Internal Medicine

## 2023-12-21 ENCOUNTER — Encounter: Payer: Self-pay | Admitting: Gastroenterology

## 2023-12-21 NOTE — H&P (View-Only) (Signed)
 Referring Provider: Donetta Potts, MD Primary Care Physicia n:  Donetta Potts, MD Primary Gastroenterologist:  Dr. Marletta Lor  Chief Complaint  Patient presents with   Hematemesis    Pt says she has issues with ulcers. Stomach is hurting and she can taste the blood. Having watery bowel movements since being discharged from hosptial    HPI:   Erika Downs is a 63 y.o. female presenting today at the request of Donetta Potts, MD for hematemesis.   She has history significant for upper GI bleed in the setting of PUD.   EGD 11/15/22: 3 cm HH, 30 mm x 30mm non-bleeding cratered gastric ulcer with adherent clot in gastric antrumx with heaped up edges concerning for malignancy s/p biopsy, non-bleeding mm gastric ulcer, red blood in gastric body.  Pathology consistent with ulcer with H pylori.   EUS 12/24/22 at [facility] due to concern for gastric neoplasm:  EGD Impression: - Normal esophagus. - Non- bleeding 15mm gastric ulcer with no stigmata of bleeding. Biopsied. - Non- bleeding 10 mm gastric ulcer with no stigmata of bleeding. Biopsied. - Duodenitis.  EUS Impression: - An ulcer was found in the body of the stomach. Tissue was obtained from this exam, and results are pending. No clear mass lesion was visualized and it is unclear if this is a benign inflammatory ulcer that has eroded to the gastric wall with inflammatory changes or an ulcerated malignancy. This was staged T3 N1 Mx by endosonographic criteria. The staging applies if malignancy is confirmed. - Two abnormal lymph nodes were visualized in the gastrohepatic ligament ( level 18) . Tissue was obtained from this exam, and results are pending. Fine needle biopsy performed. - No lymph nodes were visualized in the celiac region ( level 20) . - There was no evidence of significant pathology in the left lobe of the liver.  Pathology negative for H pylori and dysplasia or malignancy. Aspirate from lymph node also negative for  malignancy.   Recommended repeat EGD in 2 months to ensure ulcer healing.    Reviewed referral from PCP.  OV 12/06/23. Patient follow-up on dyspnea which had improved since using albuterol. Had appt with [facility] who ordered stress test and ECHO. Reproted intermittent hematochezia and refused to see [facility]. Lab from January 2025 with Hgb 14.9. She was on pantoprazole 40 mg daily, carafate QID, and new referral placed to GI.    Today:  Has been having trouble since her hospitalization last year with PUD. Epigastric pain. States she can tell her ulcer is bleeding because she can taste it. When she eats and drinks, she will either have vomiting or diarrhea. This has been this way since hospital discharge.  Epigastric pain is constant. Worse with meals. Foods and liquids getting stuck in her esophagus. Liquids go down ok.  No lower abdominal pain.  Taking carafate 4 times a day, pantoprazole 40 mg once a day, and famotidine 20 mg twice a day.   Vomiting daily. Sometimes vomiting blood.   Since last year, she has been vomiting more. Bowels aren't right. Bowels are like water. At least 10 bowel movements per day. Bowels in the middle of the night. No brbpr or melena. All really light brown. No sick contacts. Before this, bowels were "normal."   Was on iron at one point. Made her feel bad. Took them for a while, but couldn't continue because she felt so bad.    No NSAIDs aside from 81 mg aspirin.  Was taking goody powders, but none since March last year.    No prior colonoscopy.    Past Medical History:  Diagnosis Date   Chest pain    COPD (chronic obstructive pulmonary disease) (HCC)    Diabetes (HCC)    borderline   Hyperlipidemia    PUD (peptic ulcer disease)     Past Surgical History:  Procedure Laterality Date   BLADDER SUSPENSION     ESOPHAGOGASTRODUODENOSCOPY (EGD) WITH PROPOFOL N/A 11/15/2022   Procedure: ESOPHAGOGASTRODUODENOSCOPY (EGD) WITH PROPOFOL;  Surgeon:  Toledo, Boykin Nearing, MD;  Location: [facility] ENDOSCOPY;  Service: Gastroenterology;  Laterality: N/A;   EUS N/A 12/24/2022   Procedure: UPPER ENDOSCOPIC ULTRASOUND (EUS) LINEAR;  Surgeon: Doren Custard, MD;  Location: [facility] ENDOSCOPY;  Service: Gastroenterology;  Laterality: N/A;   NOVASURE ABLATION      Current Outpatient Medications  Medication Sig Dispense Refill   albuterol (VENTOLIN HFA) 108 (90 Base) MCG/ACT inhaler Inhale 2 puffs into the lungs every 6 (six) hours as needed for wheezing or shortness of breath.     alum & mag hydroxide-simeth (MAALOX/MYLANTA) 200-200-20 MG/5 SUSP Apply 1 Application topically as needed. 355 mL 2   aspirin 81 MG chewable tablet Chew 81 mg by mouth daily.     lidocaine (XYLOCAINE) 2 % solution Use as directed 15 mLs in the mouth or throat every 6 (six) hours as needed for mouth pain. 100 mL 1   pantoprazole (PROTONIX) 40 MG tablet Take 1 tablet (40 mg total) by mouth 2 (two) times daily before a meal. 60 tablet 3   rosuvastatin (CRESTOR) 20 MG tablet Take 1 tablet by mouth daily.     sucralfate (CARAFATE) 1 g tablet Take 1 tablet (1 g total) by mouth 4 (four) times daily -  with meals and at bedtime. 120 tablet 1   No current facility-administered medications for this visit.    Allergies as of 12/23/2023 - Review Complete 12/23/2023  Allergen Reaction Noted   Ibuprofen Hives and Swelling 09/25/2011   Ivp dye [iodinated contrast media] Hives 04/01/2018   Penicillins Hives and Swelling 09/25/2011   Tylenol [acetaminophen] Hives 09/06/2023    Family History  Problem Relation Age of Onset   Cancer - Colon Neg Hx    Stomach cancer Neg Hx     Social History   Socioeconomic History   Marital status: Single    Spouse name: Not on file   Number of children: Not on file   Years of education: Not on file   Highest education level: Not on file  Occupational History   Not on file  Tobacco Use   Smoking status: Every Day    Current packs/day: 0.50     Types: Cigarettes   Smokeless tobacco: Never  Vaping Use   Vaping status: Never Used  Substance and Sexual Activity   Alcohol use: Not Currently   Drug use: No   Sexual activity: Not on file  Other Topics Concern   Not on file  Social History Narrative   Not on file   Social Drivers of Health   Financial Resource Strain: Not on file  Food Insecurity: No Food Insecurity (11/16/2022)   Hunger Vital Sign    Worried About Running Out of Food in the Last Year: Never true    Ran Out of Food in the Last Year: Never true  Transportation Needs: No Transportation Needs (11/16/2022)   PRAPARE - Administrator, Civil Service (Medical): No  Lack of Transportation (Non-Medical): No  Physical Activity: Inactive (06/23/2023)   Received from [facility]   Exercise Vital Sign    Days of Exercise per Week: 0 days    Minutes of Exercise per Session: 0 min  Stress: Not on file  Social Connections: Not on file  Intimate Partner Violence: Not At Risk (11/16/2022)   Humiliation, Afraid, Rape, and Kick questionnaire    Fear of Current or Ex-Partner: No    Emotionally Abused: No    Physically Abused: No    Sexually Abused: No    Review of Systems: Gen: Denies any fever, chills, cold or flulike symptoms, presyncope, syncope. CV: Denies chest pain, heart palpitations. Resp: Denies shortness of breath, cough. GI: See HPI GU : Denies urinary burning, urinary frequency, urinary hesitancy MS: Denies joint pain. Derm: Denies rash. Psych: Denies depression, anxiety. Heme: See HPI  Physical Exam: BP (!) 171/84 (BP Location: Right Arm, Patient Position: Sitting, Cuff Size: Normal)   Pulse 67   Temp 97.9 F (36.6 C) (Temporal)   Ht 5\' 2"  (1.575 m)   Wt 122 lb 3.2 oz (55.4 kg)   BMI 22.35 kg/m  General:   Alert and oriented. Pleasant and cooperative. Well-nourished and well-developed.  Head:  Normocephalic and atraumatic. Eyes:  Without icterus, sclera clear and conjunctiva pink.   Ears:  Normal auditory acuity. Lungs:  Clear to auscultation bilaterally. No wheezes, rales, or rhonchi. No distress.  Heart:  S1, S2 present without murmurs appreciated.  Abdomen:  +BS, soft, and non-distended. No HSM noted. No guarding or rebound. No masses appreciated.  Rectal:  Deferred  Msk:  Symmetrical without gross deformities. Normal posture. Extremities:  Without edema. Neurologic:  Alert and  oriented x4;  grossly normal neurologically. Skin:  Intact without significant lesions or rashes. Psych:  Normal mood and affect.    Assessment:  63 year old female with history of COPD, borderline diabetes, HLD, PUD, presenting today for further evaluation of nausea/vomiting with hematemesis, epigastric pain, diarrhea.  Epigastric pain/nausea/vomiting/hematemesis: Constant epigastric pain with daily nausea/vomiting, intermittent hematemesis since PUD diagnosis last year with worsening symptoms recently.  History of large 30 x 30 mm gastric ulcer noted on EGD in February 2024 with biopsies consistent with H. pylori.  EUS 12/24/2022 showed 15 mm and 10 mm gastric ulcer, duodenitis.  2 abnormal lymph nodes visualized in the gastrohepatic ligament with tissue obtained and FNA performed.  Pathology was negative for H. pylori, dysplasia, malignancy, aspirate from lymph node also negative for malignancy.  She was advised to repeat an EGD in 2 months, but this was not performed.  She has been on Carafate 4 times daily, but only taking pantoprazole once a day while taking famotidine twice a day.  Currently denies NSAIDs aside from 81 mg aspirin.  Previously taking Goody powders, but discontinued after PUD diagnosis.  Suspect ongoing symptoms may very well be related to persistent PUD versus malignancy, gastric outlet obstruction, gastritis, duodenitis, esophagitis.  Needs EGD for further evaluation.  Will also update labs, increase pantoprazole to 40 mg twice daily, and provide viscous lidocaine as  patient is asking for something to help with pain.   Dysphagia: Solid/solid food dysphagia.  May have esophagitis, esophageal web, ring, stricture in the setting of recurrent vomiting.  This will be evaluated at the time of EGD.  Will add dilation as appropriate.  Diarrhea Significant watery diarrhea since hospitalization in February 2024 with 10 or more bowel movements per day.  Denies abdominal pain related to  bowel movements, BRBPR, or melena.  No prior colonoscopy.  Will obtain stool studies to rule out infectious etiologies.  Will also check TSH, screen for celiac disease.  If testing is negative, ideally will need to proceed with colonoscopy, patient likely unable to tolerate prep at this time.  Will plan to start Imodium to help with symptom control while we address upper GI issues.    Plan:  CBC, CMP, lipase, TSH, IgA, TTG IgA, C. difficile GDH and toxin A/B, GI pathogen panel, Cryptosporidium, Giardia. Proceed with upper endoscopy with propofol by Dr. Marletta Lor in near future. The risks, benefits, and alternatives have been discussed with the patient in detail. The patient states understanding and desires to proceed.  ASA 3 Increase pantoprazole to 40 mg twice daily. Okay to stop famotidine. Okay to continue Carafate for now. Viscous lidocaine 2%, 15 mL every 6 hours as needed. Dysphagia precautions:  Eat slowly, take small bites, chew thoroughly, drink plenty of liquids throughout meals.  Avoid trough textures All meats should be chopped finely.  If something gets hung in your esophagus and will not come up or go down, proceed to the emergency room.   Follow-up after EGD.   Ermalinda Memos, PA-C [facility] 12/23/2023

## 2023-12-21 NOTE — Progress Notes (Unsigned)
 Referring Provider: Donetta Potts, MD Primary Care Physicia n:  Donetta Potts, MD Primary Gastroenterologist:  Dr. Marletta Lor  Chief Complaint  Patient presents with   Hematemesis    Pt says she has issues with ulcers. Stomach is hurting and she can taste the blood. Having watery bowel movements since being discharged from hosptial    HPI:   Erika Downs is a 63 y.o. female presenting today at the request of Donetta Potts, MD for hematemesis.   She has history significant for upper GI bleed in the setting of PUD.   EGD 11/15/22: 3 cm HH, 30 mm x 30mm non-bleeding cratered gastric ulcer with adherent clot in gastric antrumx with heaped up edges concerning for malignancy s/p biopsy, non-bleeding mm gastric ulcer, red blood in gastric body.  Pathology consistent with ulcer with H pylori.   EUS 12/24/22 at [facility] due to concern for gastric neoplasm:  EGD Impression: - Normal esophagus. - Non- bleeding 15mm gastric ulcer with no stigmata of bleeding. Biopsied. - Non- bleeding 10 mm gastric ulcer with no stigmata of bleeding. Biopsied. - Duodenitis.  EUS Impression: - An ulcer was found in the body of the stomach. Tissue was obtained from this exam, and results are pending. No clear mass lesion was visualized and it is unclear if this is a benign inflammatory ulcer that has eroded to the gastric wall with inflammatory changes or an ulcerated malignancy. This was staged T3 N1 Mx by endosonographic criteria. The staging applies if malignancy is confirmed. - Two abnormal lymph nodes were visualized in the gastrohepatic ligament ( level 18) . Tissue was obtained from this exam, and results are pending. Fine needle biopsy performed. - No lymph nodes were visualized in the celiac region ( level 20) . - There was no evidence of significant pathology in the left lobe of the liver.  Pathology negative for H pylori and dysplasia or malignancy. Aspirate from lymph node also negative for  malignancy.   Recommended repeat EGD in 2 months to ensure ulcer healing.    Reviewed referral from PCP.  OV 12/06/23. Patient follow-up on dyspnea which had improved since using albuterol. Had appt with [facility] who ordered stress test and ECHO. Reproted intermittent hematochezia and refused to see [facility]. Lab from January 2025 with Hgb 14.9. She was on pantoprazole 40 mg daily, carafate QID, and new referral placed to GI.    Today:  Has been having trouble since her hospitalization last year with PUD. Epigastric pain. States she can tell her ulcer is bleeding because she can taste it. When she eats and drinks, she will either have vomiting or diarrhea. This has been this way since hospital discharge.  Epigastric pain is constant. Worse with meals. Foods and liquids getting stuck in her esophagus. Liquids go down ok.  No lower abdominal pain.  Taking carafate 4 times a day, pantoprazole 40 mg once a day, and famotidine 20 mg twice a day.   Vomiting daily. Sometimes vomiting blood.   Since last year, she has been vomiting more. Bowels aren't right. Bowels are like water. At least 10 bowel movements per day. Bowels in the middle of the night. No brbpr or melena. All really light brown. No sick contacts. Before this, bowels were "normal."   Was on iron at one point. Made her feel bad. Took them for a while, but couldn't continue because she felt so bad.    No NSAIDs aside from 81 mg aspirin.  Was taking goody powders, but none since March last year.    No prior colonoscopy.    Past Medical History:  Diagnosis Date   Chest pain    COPD (chronic obstructive pulmonary disease) (HCC)    Diabetes (HCC)    borderline   Hyperlipidemia    PUD (peptic ulcer disease)     Past Surgical History:  Procedure Laterality Date   BLADDER SUSPENSION     ESOPHAGOGASTRODUODENOSCOPY (EGD) WITH PROPOFOL N/A 11/15/2022   Procedure: ESOPHAGOGASTRODUODENOSCOPY (EGD) WITH PROPOFOL;  Surgeon:  Toledo, Boykin Nearing, MD;  Location: [facility] ENDOSCOPY;  Service: Gastroenterology;  Laterality: N/A;   EUS N/A 12/24/2022   Procedure: UPPER ENDOSCOPIC ULTRASOUND (EUS) LINEAR;  Surgeon: Doren Custard, MD;  Location: [facility] ENDOSCOPY;  Service: Gastroenterology;  Laterality: N/A;   NOVASURE ABLATION      Current Outpatient Medications  Medication Sig Dispense Refill   albuterol (VENTOLIN HFA) 108 (90 Base) MCG/ACT inhaler Inhale 2 puffs into the lungs every 6 (six) hours as needed for wheezing or shortness of breath.     alum & mag hydroxide-simeth (MAALOX/MYLANTA) 200-200-20 MG/5 SUSP Apply 1 Application topically as needed. 355 mL 2   aspirin 81 MG chewable tablet Chew 81 mg by mouth daily.     lidocaine (XYLOCAINE) 2 % solution Use as directed 15 mLs in the mouth or throat every 6 (six) hours as needed for mouth pain. 100 mL 1   pantoprazole (PROTONIX) 40 MG tablet Take 1 tablet (40 mg total) by mouth 2 (two) times daily before a meal. 60 tablet 3   rosuvastatin (CRESTOR) 20 MG tablet Take 1 tablet by mouth daily.     sucralfate (CARAFATE) 1 g tablet Take 1 tablet (1 g total) by mouth 4 (four) times daily -  with meals and at bedtime. 120 tablet 1   No current facility-administered medications for this visit.    Allergies as of 12/23/2023 - Review Complete 12/23/2023  Allergen Reaction Noted   Ibuprofen Hives and Swelling 09/25/2011   Ivp dye [iodinated contrast media] Hives 04/01/2018   Penicillins Hives and Swelling 09/25/2011   Tylenol [acetaminophen] Hives 09/06/2023    Family History  Problem Relation Age of Onset   Cancer - Colon Neg Hx    Stomach cancer Neg Hx     Social History   Socioeconomic History   Marital status: Single    Spouse name: Not on file   Number of children: Not on file   Years of education: Not on file   Highest education level: Not on file  Occupational History   Not on file  Tobacco Use   Smoking status: Every Day    Current packs/day: 0.50     Types: Cigarettes   Smokeless tobacco: Never  Vaping Use   Vaping status: Never Used  Substance and Sexual Activity   Alcohol use: Not Currently   Drug use: No   Sexual activity: Not on file  Other Topics Concern   Not on file  Social History Narrative   Not on file   Social Drivers of Health   Financial Resource Strain: Not on file  Food Insecurity: No Food Insecurity (11/16/2022)   Hunger Vital Sign    Worried About Running Out of Food in the Last Year: Never true    Ran Out of Food in the Last Year: Never true  Transportation Needs: No Transportation Needs (11/16/2022)   PRAPARE - Administrator, Civil Service (Medical): No  Lack of Transportation (Non-Medical): No  Physical Activity: Inactive (06/23/2023)   Received from [facility]   Exercise Vital Sign    Days of Exercise per Week: 0 days    Minutes of Exercise per Session: 0 min  Stress: Not on file  Social Connections: Not on file  Intimate Partner Violence: Not At Risk (11/16/2022)   Humiliation, Afraid, Rape, and Kick questionnaire    Fear of Current or Ex-Partner: No    Emotionally Abused: No    Physically Abused: No    Sexually Abused: No    Review of Systems: Gen: Denies any fever, chills, cold or flulike symptoms, presyncope, syncope. CV: Denies chest pain, heart palpitations. Resp: Denies shortness of breath, cough. GI: See HPI GU : Denies urinary burning, urinary frequency, urinary hesitancy MS: Denies joint pain. Derm: Denies rash. Psych: Denies depression, anxiety. Heme: See HPI  Physical Exam: BP (!) 171/84 (BP Location: Right Arm, Patient Position: Sitting, Cuff Size: Normal)   Pulse 67   Temp 97.9 F (36.6 C) (Temporal)   Ht 5\' 2"  (1.575 m)   Wt 122 lb 3.2 oz (55.4 kg)   BMI 22.35 kg/m  General:   Alert and oriented. Pleasant and cooperative. Well-nourished and well-developed.  Head:  Normocephalic and atraumatic. Eyes:  Without icterus, sclera clear and conjunctiva pink.   Ears:  Normal auditory acuity. Lungs:  Clear to auscultation bilaterally. No wheezes, rales, or rhonchi. No distress.  Heart:  S1, S2 present without murmurs appreciated.  Abdomen:  +BS, soft, and non-distended. No HSM noted. No guarding or rebound. No masses appreciated.  Rectal:  Deferred  Msk:  Symmetrical without gross deformities. Normal posture. Extremities:  Without edema. Neurologic:  Alert and  oriented x4;  grossly normal neurologically. Skin:  Intact without significant lesions or rashes. Psych:  Normal mood and affect.    Assessment:  63 year old female with history of COPD, borderline diabetes, HLD, PUD, presenting today for further evaluation of nausea/vomiting with hematemesis, epigastric pain, diarrhea.  Epigastric pain/nausea/vomiting/hematemesis: Constant epigastric pain with daily nausea/vomiting, intermittent hematemesis since PUD diagnosis last year with worsening symptoms recently.  History of large 30 x 30 mm gastric ulcer noted on EGD in February 2024 with biopsies consistent with H. pylori.  EUS 12/24/2022 showed 15 mm and 10 mm gastric ulcer, duodenitis.  2 abnormal lymph nodes visualized in the gastrohepatic ligament with tissue obtained and FNA performed.  Pathology was negative for H. pylori, dysplasia, malignancy, aspirate from lymph node also negative for malignancy.  She was advised to repeat an EGD in 2 months, but this was not performed.  She has been on Carafate 4 times daily, but only taking pantoprazole once a day while taking famotidine twice a day.  Currently denies NSAIDs aside from 81 mg aspirin.  Previously taking Goody powders, but discontinued after PUD diagnosis.  Suspect ongoing symptoms may very well be related to persistent PUD versus malignancy, gastric outlet obstruction, gastritis, duodenitis, esophagitis.  Needs EGD for further evaluation.  Will also update labs, increase pantoprazole to 40 mg twice daily, and provide viscous lidocaine as  patient is asking for something to help with pain.   Dysphagia: Solid/solid food dysphagia.  May have esophagitis, esophageal web, ring, stricture in the setting of recurrent vomiting.  This will be evaluated at the time of EGD.  Will add dilation as appropriate.  Diarrhea Significant watery diarrhea since hospitalization in February 2024 with 10 or more bowel movements per day.  Denies abdominal pain related to  bowel movements, BRBPR, or melena.  No prior colonoscopy.  Will obtain stool studies to rule out infectious etiologies.  Will also check TSH, screen for celiac disease.  If testing is negative, ideally will need to proceed with colonoscopy, patient likely unable to tolerate prep at this time.  Will plan to start Imodium to help with symptom control while we address upper GI issues.    Plan:  CBC, CMP, lipase, TSH, IgA, TTG IgA, C. difficile GDH and toxin A/B, GI pathogen panel, Cryptosporidium, Giardia. Proceed with upper endoscopy with propofol by Dr. Marletta Lor in near future. The risks, benefits, and alternatives have been discussed with the patient in detail. The patient states understanding and desires to proceed.  ASA 3 Increase pantoprazole to 40 mg twice daily. Okay to stop famotidine. Okay to continue Carafate for now. Viscous lidocaine 2%, 15 mL every 6 hours as needed. Dysphagia precautions:  Eat slowly, take small bites, chew thoroughly, drink plenty of liquids throughout meals.  Avoid trough textures All meats should be chopped finely.  If something gets hung in your esophagus and will not come up or go down, proceed to the emergency room.   Follow-up after EGD.   Ermalinda Memos, PA-C [facility] 12/23/2023

## 2023-12-23 ENCOUNTER — Telehealth: Payer: Self-pay | Admitting: *Deleted

## 2023-12-23 ENCOUNTER — Encounter: Payer: Self-pay | Admitting: *Deleted

## 2023-12-23 ENCOUNTER — Encounter: Payer: Self-pay | Admitting: Gastroenterology

## 2023-12-23 ENCOUNTER — Ambulatory Visit: Admitting: Gastroenterology

## 2023-12-23 VITALS — BP 171/84 | HR 67 | Temp 97.9°F | Ht 62.0 in | Wt 122.2 lb

## 2023-12-23 DIAGNOSIS — R1013 Epigastric pain: Secondary | ICD-10-CM | POA: Diagnosis not present

## 2023-12-23 DIAGNOSIS — K920 Hematemesis: Secondary | ICD-10-CM

## 2023-12-23 DIAGNOSIS — Z8711 Personal history of peptic ulcer disease: Secondary | ICD-10-CM

## 2023-12-23 DIAGNOSIS — R1310 Dysphagia, unspecified: Secondary | ICD-10-CM

## 2023-12-23 DIAGNOSIS — R197 Diarrhea, unspecified: Secondary | ICD-10-CM | POA: Diagnosis not present

## 2023-12-23 MED ORDER — PANTOPRAZOLE SODIUM 40 MG PO TBEC: 40.0000 mg | DELAYED_RELEASE_TABLET | Freq: Two times a day (BID) | ORAL | 3 refills | Status: DC

## 2023-12-23 MED ORDER — LIDOCAINE VISCOUS HCL 2 % MT SOLN: 15.0000 mL | Freq: Four times a day (QID) | OROMUCOSAL | 1 refills | Status: AC | PRN

## 2023-12-23 NOTE — Telephone Encounter (Signed)
 Availity PA: Service 1 Procedure Code (CPT/HCPCS) [phone] - EGD DIAGNOSTIC BRUSH [facility]  Procedure Service Quantity 1 Days  Service From - To Date 2023-12-29 - 2023-12-29  Status PENDED  Status Reason Disposition pending review  Service 2 Procedure Code (CPT/HCPCS) [phone] - EGD GUIDE WIRE INSERTION  Procedure Service Quantity 1 Days  Service From - To Date 2023-12-29 - 2023-12-29  Status PENDED  Status Reason Disposition pending review

## 2023-12-23 NOTE — Patient Instructions (Addendum)
 Please have labs and stool testing completed at [facility] lab.  Will get you scheduled for an upper endoscopy in the near future with Dr. Marletta Lor with possible stretching of your esophagus.  Increase pantoprazole to 40 mg twice daily 30 minutes before breakfast and dinner.   You can stop famotidine.   You can continue carafate 4 times a day as needed.   I am sending in viscous lidocaine for you to use as needed to help with your upper abdominal pain.  We will place 15 mL for the back of your tongue and swallow every 6 hours as needed.  Swallowing precautions:  Eat slowly, take small bites, chew thoroughly, drink plenty of liquids throughout meals.  Avoid trough textures All meats should be chopped finely.  If something gets hung in your esophagus and will not come up or go down, proceed to the emergency room.    I will plan to see you back in the office after your upper endoscopy, but we will be in contact with you with your labs and stool results.  It was good to meet you today!   Ermalinda Memos, PA-C [facility]'

## 2023-12-24 ENCOUNTER — Encounter: Payer: Self-pay | Admitting: Gastroenterology

## 2023-12-24 NOTE — Telephone Encounter (Signed)
 Pt left vm returning call   LMOVM to return call  Pre-op appointment Tuesday 12/28/23, arrive at 2:45 pm.

## 2023-12-24 NOTE — Telephone Encounter (Signed)
 Pt was already aware of pre-op appt

## 2023-12-27 LAB — CBC WITH DIFFERENTIAL/PLATELET
Absolute Lymphocytes: 2271 {cells}/uL (ref 850–3900)
Absolute Monocytes: 469 {cells}/uL (ref 200–950)
Basophils Absolute: 27 {cells}/uL (ref 0–200)
Basophils Relative: 0.4 %
Eosinophils Absolute: 67 {cells}/uL (ref 15–500)
Eosinophils Relative: 1 %
HCT: 45.3 % — ABNORMAL HIGH (ref 35.0–45.0)
Hemoglobin: 15.2 g/dL (ref 11.7–15.5)
MCH: 30.2 pg (ref 27.0–33.0)
MCHC: 33.6 g/dL (ref 32.0–36.0)
MCV: 90.1 fL (ref 80.0–100.0)
MPV: 9.7 fL (ref 7.5–12.5)
Monocytes Relative: 7 %
Neutro Abs: 3866 {cells}/uL (ref 1500–7800)
Neutrophils Relative %: 57.7 %
Platelets: 317 10*3/uL (ref 140–400)
RBC: 5.03 10*6/uL (ref 3.80–5.10)
RDW: 13.4 % (ref 11.0–15.0)
Total Lymphocyte: 33.9 %
WBC: 6.7 10*3/uL (ref 3.8–10.8)

## 2023-12-27 LAB — COMPLETE METABOLIC PANEL WITHOUT GFR
AG Ratio: 1.6 (calc) (ref 1.0–2.5)
ALT: 7 U/L (ref 6–29)
AST: 13 U/L (ref 10–35)
Albumin: 4.5 g/dL (ref 3.6–5.1)
Alkaline phosphatase (APISO): 99 U/L (ref 37–153)
BUN: 15 mg/dL (ref 7–25)
CO2: 25 mmol/L (ref 20–32)
Calcium: 9.5 mg/dL (ref 8.6–10.4)
Chloride: 107 mmol/L (ref 98–110)
Creat: 0.61 mg/dL (ref 0.50–1.05)
Globulin: 2.9 g/dL (ref 1.9–3.7)
Glucose, Bld: 70 mg/dL (ref 65–99)
Potassium: 4.9 mmol/L (ref 3.5–5.3)
Sodium: 144 mmol/L (ref 135–146)
Total Bilirubin: 0.2 mg/dL (ref 0.2–1.2)
Total Protein: 7.4 g/dL (ref 6.1–8.1)

## 2023-12-27 LAB — IGA: Immunoglobulin A: 271 mg/dL (ref 47–310)

## 2023-12-27 LAB — TISSUE TRANSGLUTAMINASE, IGA: (tTG) Ab, IgA: <1 U/mL

## 2023-12-27 LAB — TSH: TSH: 0.29 m[IU]/L — ABNORMAL LOW (ref 0.40–4.50)

## 2023-12-27 LAB — LIPASE: Lipase: 50 U/L (ref 7–60)

## 2023-12-27 NOTE — Patient Instructions (Signed)
 Erika Downs  12/27/2023     @PREFPERIOPPHARMACY @   Your procedure is scheduled on  12/29/2023.   Report to [facility] at  1130 A.M.    Call this number if you have problems the morning of surgery:  [phone]  If you experience any cold or flu symptoms such as cough, fever, chills, shortness of breath, etc. between now and your scheduled surgery, please notify us at the above number.   Remember:         DO NOT take any medications for diabetes the morning of your procedure.   Follow the diet instructions given to you by the office.   You may drink clear liquids until  0930 am on 12/29/2023.    Clear liquids allowed are:                    Water, Juice (No red color; non-citric and without pulp; diabetics please choose diet or no sugar options), Carbonated beverages (diabetics please choose diet or no sugar options), Clear Tea (No creamer, milk, or cream, including half & half and powdered creamer), Black Coffee Only (No creamer, milk or cream, including half & half and powdered creamer), and Clear Sports drink (No red color; diabetics please choose diet or no sugar options)    Take these medicines the morning of surgery with A SIP OF WATER                                                    pantoprazole.    Do not wear jewelry, make-up or nail polish, including gel polish,  artificial nails, or any other type of covering on natural nails (fingers and  toes).  Do not wear lotions, powders, or perfumes, or deodorant.  Do not shave 48 hours prior to surgery.  Men may shave face and neck.  Do not bring valuables to the hospital.  [facility] is not responsible for any belongings or valuables.  Contacts, dentures or bridgework may not be worn into surgery.  Leave your suitcase in the car.  After surgery it may be brought to your room.  For patients admitted to the hospital, discharge time will be determined by your treatment team.  Patients discharged the day of  surgery will not be allowed to drive home and must have someone with them for 24 hours.    Special instructions:   DO NOT smoke tobacco or vape for 24 hours before your procedure.  Please read over the following fact sheets that you were given. Anesthesia Post-op Instructions and Care and Recovery After Surgery      Upper Endoscopy, Adult, Care After After the procedure, it is common to have a sore throat. It is also common to have: Mild stomach pain or discomfort. Bloating. Nausea. Follow these instructions at home: The instructions below may help you care for yourself at home. Your health care provider may give you more instructions. If you have questions, ask your health care provider. If you were given a sedative during the procedure, it can affect you for several hours. Do not drive or operate machinery until your health care provider says that it is safe. If you will be going home right after the procedure, plan to have a responsible adult: Take you home from the  hospital or clinic. You will not be allowed to drive. Care for you for the time you are told. Follow instructions from your health care provider about what you may eat and drink. Return to your normal activities as told by your health care provider. Ask your health care provider what activities are safe for you. Take over-the-counter and prescription medicines only as told by your health care provider. Contact a health care provider if you: Have a sore throat that lasts longer than one day. Have trouble swallowing. Have a fever. Get help right away if you: Vomit blood or your vomit looks like coffee grounds. Have bloody, black, or tarry stools. Have a very bad sore throat or you cannot swallow. Have difficulty breathing or very bad pain in your chest or abdomen. These symptoms may be an emergency. Get help right away. Call 911. Do not wait to see if the symptoms will go away. Do not drive yourself to the  hospital. Summary After the procedure, it is common to have a sore throat, mild stomach discomfort, bloating, and nausea. If you were given a sedative during the procedure, it can affect you for several hours. Do not drive until your health care provider says that it is safe. Follow instructions from your health care provider about what you may eat and drink. Return to your normal activities as told by your health care provider. This information is not intended to replace advice given to you by your health care provider. Make sure you discuss any questions you have with your health care provider. Document Revised: 12/24/2021 Document Reviewed: 12/24/2021 Elsevier Patient Education  2024 [employer].Esophageal Dilatation Esophageal dilatation, or dilation, is done to stretch a blocked or narrowed part of your esophagus. The esophagus is the part of your body that moves food from your mouth to your stomach. You may need to have it stretched if: You have a lot of scar tissue and it makes it hard or painful to swallow. You have cancer of the esophagus. There's a problem with how food moves through your esophagus. In some cases, you may need to have this procedure done more than once. Tell a health care provider about: Any allergies you have. All medicines you're taking, including vitamins, herbs, eye drops, creams, and over-the-counter medicines. Any problems you or family members have had with anesthesia. Any bleeding problems you have. Any surgeries you've had. Any medical conditions you have. Whether you're pregnant or may be pregnant. What are the risks? Your health care provider will talk with you about risks. These may include: Bleeding. A hole or tear in your esophagus. What happens before the procedure? When to stop eating and drinking Follow instructions from your provider about what you may eat and drink. These may include: 8 hours before your procedure Stop eating most foods.  Do not eat meat, fried foods, or fatty foods. Eat only light foods, such as toast or crackers. All liquids are okay except energy drinks and alcohol. 6 hours before your procedure Stop eating. Drink only clear liquids, such as water, clear fruit juice, black coffee, plain tea, and sports drinks. Do not drink energy drinks or alcohol. 2 hours before your procedure Stop drinking all liquids. You may be allowed to take medicines with small sips of water. If you don't follow your provider's instructions, your procedure may be delayed or canceled. Medicines Ask your provider about: Changing or stopping your regular medicines. These include any diabetes medicines or blood thinners you take. Taking medicines  such as aspirin and ibuprofen. These medicines can thin your blood. Do not take them unless your provider tells you to. Taking over-the-counter medicines, vitamins, herbs, and supplements. General instructions If you'll be going home right after the procedure, plan to have a responsible adult: Take you home from the hospital or clinic. You won't be allowed to drive. Care for you for the time you're told. What happens during the procedure? You may be given: A sedative. This helps you relax. Anesthesia. This keeps you from feeling pain. It will numb certain areas of your body. The stretching may be done with: Simple dilators. These are tools put in your esophagus to stretch it. Guide wires. These wires are put in using a tube called an endoscope. A dilator is put over the wires to stretch your esophagus. Then the wires are taken out. A balloon. The balloon is on the end of a tube. It's inflated to stretch your esophagus. The procedure may vary among providers and hospitals. What can I expect after the procedure? Your blood pressure, heart rate, breathing rate, and blood oxygen level will be monitored until you leave the hospital or clinic. Your throat may feel sore and numb. This will get  better over time. You won't be allowed to eat or drink until your throat is no longer numb. You may be able to go home when you can: Drink. Pee. Sit on the edge of the bed without nausea or dizziness. Follow these instructions at home: Activity If you were given a sedative during the procedure, it can affect you for several hours. Do not drive or operate machinery until your provider says it's safe. Return to your normal activities as told by your provider. Ask your provider what activities are safe for you. General instructions Take over-the-counter and prescription medicines only as told by your provider. Follow instructions from your provider about what you may eat and drink. Do not use any products that contain nicotine or tobacco. These products include cigarettes, chewing tobacco, and vaping devices, such as e-cigarettes. If you need help quitting, ask your provider. Keep all follow-up visits. Your provider will make sure the procedure worked. Where to find more information American Society for Gastrointestinal Endoscopy (ASGE): asge.org Contact a health care provider if: You have trouble swallowing. You have a fever. Your pain doesn't get better with medicine. Get help right away if: You have chest pain. You have trouble breathing. You vomit blood. Your poop is: Black. Tarry. Bloody. These symptoms may be an emergency. Get help right away. Call 911. Do not wait to see if the symptoms will go away. Do not drive yourself to the hospital. This information is not intended to replace advice given to you by your health care provider. Make sure you discuss any questions you have with your health care provider. Document Revised: 12/11/2022 Document Reviewed: 12/11/2022 Elsevier Patient Education  2024 Elsevier Inc.General Anesthesia, Adult, Care After The following information offers guidance on how to care for yourself after your procedure. Your health care provider may also give  you more specific instructions. If you have problems or questions, contact your health care provider. What can I expect after the procedure? After the procedure, it is common for people to: Have pain or discomfort at the IV site. Have nausea or vomiting. Have a sore throat or hoarseness. Have trouble concentrating. Feel cold or chills. Feel weak, sleepy, or tired (fatigue). Have soreness and body aches. These can affect parts of the body that were not  involved in surgery. Follow these instructions at home: For the time period you were told by your health care provider:  Rest. Do not participate in activities where you could fall or become injured. Do not drive or use machinery. Do not drink alcohol. Do not take sleeping pills or medicines that cause drowsiness. Do not make important decisions or sign legal documents. Do not take care of children on your own. General instructions Drink enough fluid to keep your urine pale yellow. If you have sleep apnea, surgery and certain medicines can increase your risk for breathing problems. Follow instructions from your health care provider about wearing your sleep device: Anytime you are sleeping, including during daytime naps. While taking prescription pain medicines, sleeping medicines, or medicines that make you drowsy. Return to your normal activities as told by your health care provider. Ask your health care provider what activities are safe for you. Take over-the-counter and prescription medicines only as told by your health care provider. Do not use any products that contain nicotine or tobacco. These products include cigarettes, chewing tobacco, and vaping devices, such as e-cigarettes. These can delay incision healing after surgery. If you need help quitting, ask your health care provider. Contact a health care provider if: You have nausea or vomiting that does not get better with medicine. You vomit every time you eat or drink. You have  pain that does not get better with medicine. You cannot urinate or have bloody urine. You develop a skin rash. You have a fever. Get help right away if: You have trouble breathing. You have chest pain. You vomit blood. These symptoms may be an emergency. Get help right away. Call 911. Do not wait to see if the symptoms will go away. Do not drive yourself to the hospital. Summary After the procedure, it is common to have a sore throat, hoarseness, nausea, vomiting, or to feel weak, sleepy, or fatigue. For the time period you were told by your health care provider, do not drive or use machinery. Get help right away if you have difficulty breathing, have chest pain, or vomit blood. These symptoms may be an emergency. This information is not intended to replace advice given to you by your health care provider. Make sure you discuss any questions you have with your health care provider. Document Revised: 12/12/2021 Document Reviewed: 12/12/2021 Elsevier Patient Education  2024 [employer].

## 2023-12-28 ENCOUNTER — Encounter (HOSPITAL_COMMUNITY): Payer: Self-pay

## 2023-12-28 ENCOUNTER — Encounter (HOSPITAL_COMMUNITY)
Admission: RE | Admit: 2023-12-28 | Discharge: 2023-12-28 | Disposition: A | Discharge status: Home / Self Care | Source: Ambulatory Visit | Attending: Internal Medicine | Admitting: Internal Medicine

## 2023-12-28 ENCOUNTER — Other Ambulatory Visit: Payer: Self-pay

## 2023-12-28 VITALS — Ht 62.0 in | Wt 122.2 lb

## 2023-12-28 DIAGNOSIS — Z720 Tobacco use: Secondary | ICD-10-CM

## 2023-12-28 DIAGNOSIS — Z01810 Encounter for preprocedural cardiovascular examination: Secondary | ICD-10-CM | POA: Diagnosis not present

## 2023-12-28 DIAGNOSIS — R9431 Abnormal electrocardiogram [ECG] [EKG]: Secondary | ICD-10-CM | POA: Insufficient documentation

## 2023-12-28 DIAGNOSIS — Z01818 Encounter for other preprocedural examination: Secondary | ICD-10-CM | POA: Diagnosis present

## 2023-12-29 ENCOUNTER — Other Ambulatory Visit: Payer: Self-pay

## 2023-12-29 ENCOUNTER — Encounter (HOSPITAL_COMMUNITY): Payer: Self-pay | Admitting: Internal Medicine

## 2023-12-29 ENCOUNTER — Ambulatory Visit (HOSPITAL_COMMUNITY): Admitting: Anesthesiology

## 2023-12-29 ENCOUNTER — Ambulatory Visit (HOSPITAL_COMMUNITY)
Admission: RE | Admit: 2023-12-29 | Discharge: 2023-12-29 | Disposition: A | Discharge status: Home / Self Care | Attending: Internal Medicine | Admitting: Internal Medicine

## 2023-12-29 ENCOUNTER — Encounter (HOSPITAL_COMMUNITY)
Admission: RE | Disposition: A | Discharge status: Home / Self Care | Payer: Self-pay | Source: Home / Self Care | Attending: Internal Medicine

## 2023-12-29 DIAGNOSIS — Z79899 Other long term (current) drug therapy: Secondary | ICD-10-CM | POA: Diagnosis not present

## 2023-12-29 DIAGNOSIS — R197 Diarrhea, unspecified: Secondary | ICD-10-CM | POA: Diagnosis not present

## 2023-12-29 DIAGNOSIS — K222 Esophageal obstruction: Secondary | ICD-10-CM | POA: Diagnosis not present

## 2023-12-29 DIAGNOSIS — J449 Chronic obstructive pulmonary disease, unspecified: Secondary | ICD-10-CM

## 2023-12-29 DIAGNOSIS — Z7982 Long term (current) use of aspirin: Secondary | ICD-10-CM | POA: Diagnosis not present

## 2023-12-29 DIAGNOSIS — K2950 Unspecified chronic gastritis without bleeding: Secondary | ICD-10-CM | POA: Insufficient documentation

## 2023-12-29 DIAGNOSIS — R1013 Epigastric pain: Secondary | ICD-10-CM | POA: Diagnosis present

## 2023-12-29 DIAGNOSIS — K259 Gastric ulcer, unspecified as acute or chronic, without hemorrhage or perforation: Secondary | ICD-10-CM | POA: Diagnosis not present

## 2023-12-29 DIAGNOSIS — R1310 Dysphagia, unspecified: Secondary | ICD-10-CM | POA: Insufficient documentation

## 2023-12-29 DIAGNOSIS — K920 Hematemesis: Secondary | ICD-10-CM | POA: Insufficient documentation

## 2023-12-29 DIAGNOSIS — F17210 Nicotine dependence, cigarettes, uncomplicated: Secondary | ICD-10-CM

## 2023-12-29 DIAGNOSIS — K219 Gastro-esophageal reflux disease without esophagitis: Secondary | ICD-10-CM | POA: Diagnosis not present

## 2023-12-29 HISTORY — PX: ESOPHAGOGASTRODUODENOSCOPY: SHX5428

## 2023-12-29 SURGERY — EGD (ESOPHAGOGASTRODUODENOSCOPY)
Anesthesia: General

## 2023-12-29 MED ORDER — PROPOFOL 10 MG/ML IV BOLUS
INTRAVENOUS | Status: DC | PRN
  Administered 2023-12-29: 5 mg via INTRAVENOUS
  Administered 2023-12-29: 50 mg via INTRAVENOUS

## 2023-12-29 MED ORDER — SODIUM CHLORIDE 0.9% FLUSH: 3.0000 mL | INTRAVENOUS | Status: DC | PRN

## 2023-12-29 MED ORDER — SODIUM CHLORIDE 0.9% FLUSH: 3.0000 mL | Freq: Two times a day (BID) | INTRAVENOUS | Status: DC

## 2023-12-29 MED ORDER — LIDOCAINE HCL (CARDIAC) PF 100 MG/5ML IV SOSY
PREFILLED_SYRINGE | INTRAVENOUS | Status: DC | PRN
  Administered 2023-12-29: 50 mg via INTRATRACHEAL

## 2023-12-29 MED ORDER — PROPOFOL 500 MG/50ML IV EMUL
INTRAVENOUS | Status: DC | PRN
  Administered 2023-12-29: 150 ug/kg/min via INTRAVENOUS

## 2023-12-29 MED ORDER — LACTATED RINGERS IV SOLN: INTRAVENOUS | Status: DC | PRN

## 2023-12-29 MED ORDER — FENTANYL CITRATE PF 50 MCG/ML IJ SOSY
PREFILLED_SYRINGE | INTRAMUSCULAR | Status: AC
  Filled 2023-12-29: qty 1

## 2023-12-29 MED ORDER — FENTANYL CITRATE (PF) 100 MCG/2ML IJ SOLN
50.0000 ug | Freq: Once | INTRAMUSCULAR | Status: AC
  Administered 2023-12-29: 50 ug via INTRAVENOUS

## 2023-12-29 NOTE — Anesthesia Postprocedure Evaluation (Signed)
 Anesthesia Post Note  Patient: Erika Downs  Procedure(s) Performed: EGD (ESOPHAGOGASTRODUODENOSCOPY)  Patient location during evaluation: Short Stay Anesthesia Type: General Level of consciousness: awake and alert Pain management: pain level controlled Vital Signs Assessment: post-procedure vital signs reviewed and stable Respiratory status: spontaneous breathing Cardiovascular status: blood pressure returned to baseline and stable Postop Assessment: no apparent nausea or vomiting Anesthetic complications: no   No notable events documented.   Last Vitals:  Vitals:   12/29/23 1031  BP: (!) 179/87  Pulse: (!) 53  Resp: 18  Temp: (!) 36.4 C  SpO2: 99%    Last Pain:  Vitals:   12/29/23 1114  TempSrc:   PainSc: 5                  Minami Arriaga

## 2023-12-29 NOTE — OR Nursing (Signed)
 Complaint of abdominal pain at epigastric area. Rated 8  Blood pressure elevated .  Fentynal given 50 mcg per verbal order.

## 2023-12-29 NOTE — Discharge Instructions (Signed)
 EGD Discharge instructions Please read the instructions outlined below and refer to this sheet in the next few weeks. These discharge instructions provide you with general information on caring for yourself after you leave the hospital. Your doctor may also give you specific instructions. While your treatment has been planned according to the most current medical practices available, unavoidable complications occasionally occur. If you have any problems or questions after discharge, please call your doctor. ACTIVITY You may resume your regular activity but move at a slower pace for the next 24 hours.  Take frequent rest periods for the next 24 hours.  Walking will help expel (get rid of) the air and reduce the bloated feeling in your abdomen.  No driving for 24 hours (because of the anesthesia (medicine) used during the test).  You may shower.  Do not sign any important legal documents or operate any machinery for 24 hours (because of the anesthesia used during the test).  NUTRITION Drink plenty of fluids.  You may resume your normal diet.  Begin with a light meal and progress to your normal diet.  Avoid alcoholic beverages for 24 hours or as instructed by your caregiver.  MEDICATIONS You may resume your normal medications unless your caregiver tells you otherwise.  WHAT YOU CAN EXPECT TODAY You may experience abdominal discomfort such as a feeling of fullness or "gas" pains.  FOLLOW-UP Your doctor will discuss the results of your test with you.  SEEK IMMEDIATE MEDICAL ATTENTION IF ANY OF THE FOLLOWING OCCUR: Excessive nausea (feeling sick to your stomach) and/or vomiting.  Severe abdominal pain and distention (swelling).  Trouble swallowing.  Temperature over 101 F (37.8 C).  Rectal bleeding or vomiting of blood.   Your upper endoscopy revealed 2 very large ulcers in your stomach.  Multiple other smaller ulcers as well.  Diffuse inflammation.  I took samples of both ulcers as well as  your stomach.  We will call with these results.  Continue on pantoprazole twice daily.  Continue Carafate.  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  Absolutely no NSAID use.  We may need to consider surgical consultation.  Further recommendations after pathology results reviewed.  Follow-up in GI office in 4 weeks.   I hope you have a great rest of your week!  Hennie Duos. Marletta Lor, D.O. Gastroenterology and Hepatology [facility]

## 2023-12-29 NOTE — Transfer of Care (Signed)
 Immediate Anesthesia Transfer of Care Note  Patient: Erika Downs  Procedure(s) Performed: EGD (ESOPHAGOGASTRODUODENOSCOPY)  Patient Location: Short Stay  Anesthesia Type:General  Level of Consciousness: awake  Airway & Oxygen Therapy: Patient Spontanous Breathing  Post-op Assessment: Report given to RN  Post vital signs: Reviewed and stable  Last Vitals:  Vitals Value Taken Time  BP    Temp    Pulse    Resp    SpO2      Last Pain:  Vitals:   12/29/23 1114  TempSrc:   PainSc: 5       Patients Stated Pain Goal: 5 (12/29/23 1031)  Complications: No notable events documented.

## 2023-12-29 NOTE — Anesthesia Preprocedure Evaluation (Addendum)
 Anesthesia Evaluation  Patient identified by MRN, date of birth, ID band Patient awake    Reviewed: Allergy & Precautions, H&P , NPO status , Patient's Chart, lab work & pertinent test results, reviewed documented beta blocker date and time   History of Anesthesia Complications Negative for: history of anesthetic complications  Airway Mallampati: I  TM Distance: >3 FB Neck ROM: full  Mouth opening: Limited Mouth Opening  Dental  (+) Edentulous Upper, Edentulous Lower   Pulmonary neg shortness of breath, COPD, neg recent URI, Current Smoker and Patient abstained from smoking.   Pulmonary exam normal breath sounds clear to auscultation       Cardiovascular Exercise Tolerance: Good negative cardio ROS Normal cardiovascular exam Rhythm:regular Rate:Normal     Neuro/Psych negative neurological ROS  negative psych ROS   GI/Hepatic Neg liver ROS, PUD,GERD  Controlled,,  Endo/Other  diabetes, Type 2    Renal/GU negative Renal ROS  negative genitourinary   Musculoskeletal   Abdominal Normal abdominal exam  (+)   Peds  Hematology  (+) Blood dyscrasia, anemia   Anesthesia Other Findings Past Medical History: No date: Chest pain No date: Hyperlipidemia   Reproductive/Obstetrics negative OB ROS                             Anesthesia Physical Anesthesia Plan  ASA: 3  Anesthesia Plan: General   Post-op Pain Management: Minimal or no pain anticipated   Induction: Intravenous  PONV Risk Score and Plan: 2 and Propofol infusion  Airway Management Planned: Natural Airway and Nasal Cannula  Additional Equipment: None  Intra-op Plan:   Post-operative Plan:   Informed Consent: I have reviewed the patients History and Physical, chart, labs and discussed the procedure including the risks, benefits and alternatives for the proposed anesthesia with the patient or authorized representative who has  indicated his/her understanding and acceptance.     Dental Advisory Given  Plan Discussed with: CRNA  Anesthesia Plan Comments:        Anesthesia Quick Evaluation

## 2023-12-29 NOTE — Op Note (Signed)
 [facility] Patient Name: Erika Downs Procedure Date: 12/29/2023 10:58 AM MRN: [id number] Date of Birth: April 18, 1961 Attending MD: Hennie Duos. Marletta Lor , [place], [id number] CSN: [id number] Age: 63 Admit Type: Outpatient Procedure:                Upper GI endoscopy Indications:              Epigastric abdominal pain, Dysphagia, Nausea with                            vomiting Providers:                Hennie Duos. Marletta Lor, DO, Edrick Kins, RN, Lennice Sites Technician, Technician Referring MD:              Medicines:                See the Anesthesia note for documentation of the                            administered medications Complications:            No immediate complications. Estimated Blood Loss:     Estimated blood loss was minimal. Procedure:                Pre-Anesthesia Assessment:                           - The anesthesia plan was to use monitored                            anesthesia care (MAC).                           After obtaining informed consent, the endoscope was                            passed under direct vision. Throughout the                            procedure, the patient's blood pressure, pulse, and                            oxygen saturations were monitored continuously. The                            [id number]-H190 ([id number]) scope was introduced through the                            mouth, and advanced to the second part of duodenum.                            The upper GI endoscopy was accomplished without                            difficulty. The patient tolerated the  procedure                            well. Scope In: 11:20:24 AM Scope Out: 11:32:27 AM Total Procedure Duration: 0 hours 12 minutes 3 seconds  Findings:      A widely patent Schatzki ring was found in the distal esophagus.      One non-bleeding cratered gastric ulcer with no stigmata of bleeding was       found on the greater curvature of the stomach. The lesion  was 15-20 mm       in largest dimension. Biopsies were taken with a cold forceps for       histology.      One non-bleeding cratered gastric ulcer with no stigmata of bleeding was       found at the pylorus. The lesion was 20 mm in largest dimension.       Biopsies were taken with a cold forceps for histology.      Multiple non-bleeding cratered and superficial gastric ulcers with no       stigmata of bleeding were found in the gastric antrum. The largest       lesion was 10 mm in largest dimension.      Diffuse moderate inflammation characterized by erythema was found in the       entire examined stomach. Biopsies were taken with a cold forceps for       Helicobacter pylori testing.      The duodenal bulb, first portion of the duodenum and second portion of       the duodenum were normal. Impression:               - Widely patent Schatzki ring.                           - Non-bleeding gastric ulcer with no stigmata of                            bleeding. Biopsied.                           - Non-bleeding gastric ulcer with no stigmata of                            bleeding. Biopsied.                           - Non-bleeding gastric ulcers with no stigmata of                            bleeding.                           - Gastritis. Biopsied.                           - Normal duodenal bulb, first portion of the                            duodenum and second portion of the duodenum. Moderate Sedation:      Per Anesthesia Care  Recommendation:           - Patient has a contact number available for                            emergencies. The signs and symptoms of potential                            delayed complications were discussed with the                            patient. Return to normal activities tomorrow.                            Written discharge instructions were provided to the                            patient.                           - Soft diet.                            - Use Protonix (pantoprazole) 40 mg PO BID.                           - Use sucralfate tablets 1 gram PO QID.                           - No ibuprofen, naproxen, or other non-steroidal                            anti-inflammatory drugs.                           - Return to GI clinic in 4 weeks.                           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 May                            need to consider foregut surgical consultation for                            antrectomy if biopsies unremarkable. Likely has                            partial gastric outlet obstruction. Procedure Code(s):        --- Professional ---  82956, Esophagogastroduodenoscopy, flexible,                            transoral; with biopsy, single or multiple Diagnosis Code(s):        --- Professional ---                           K22.2, Esophageal obstruction                           K25.9, Gastric ulcer, unspecified as acute or                            chronic, without hemorrhage or perforation                           K29.70, Gastritis, unspecified, without bleeding                           R10.13, Epigastric pain                           R13.10, Dysphagia, unspecified                           R11.2, Nausea with vomiting, unspecified CPT copyright 2022 American Medical Association. All rights reserved. The codes documented in this report are preliminary and upon coder review may  be revised to meet current compliance requirements. Hennie Duos. Marletta Lor, DO Hennie Duos. [name], DO 12/29/2023 11:40:03 AM This report has been signed electronically. Number of Addenda: 0

## 2023-12-29 NOTE — OR Nursing (Signed)
 Blood pressure now 166/82

## 2023-12-29 NOTE — OR Nursing (Signed)
 Pain level coming down , rated 6 . States feeling better

## 2023-12-29 NOTE — Interval H&P Note (Signed)
 History and Physical Interval Note:  12/29/2023 10:39 AM  Erika Downs  has presented today for surgery, with the diagnosis of hematemesis,dysphagia, hx:PUD,epigastric pain.  The various methods of treatment have been discussed with the patient and family. After consideration of risks, benefits and other options for treatment, the patient has consented to  Procedure(s) with comments: EGD (ESOPHAGOGASTRODUODENOSCOPY) (N/A) - 1:30 pm, asa 3 DILATION, ESOPHAGUS (N/A) - 1:30 pm, asa 3 as a surgical intervention.  The patient's history has been reviewed, patient examined, no change in status, stable for surgery.  I have reviewed the patient's chart and labs.  Questions were answered to the patient's satisfaction.     Lanelle Bal

## 2023-12-30 ENCOUNTER — Encounter (HOSPITAL_COMMUNITY): Payer: Self-pay | Admitting: Internal Medicine

## 2023-12-31 LAB — C. DIFFICILE GDH AND TOXIN A/B
GDH ANTIGEN: NOT DETECTED
MICRO NUMBER:: 16278418
SPECIMEN QUALITY:: ADEQUATE
TOXIN A AND B: NOT DETECTED

## 2023-12-31 LAB — CRYPTOSPORIDIUM ANTIGEN, EIA
Specimen Quality:: ADEQUATE
micro Number:: 16278902

## 2023-12-31 LAB — GIARDIA ANTIGEN
MICRO NUMBER:: 16274728
RESULT:: NOT DETECTED
SPECIMEN QUALITY:: ADEQUATE

## 2023-12-31 LAB — GASTROINTESTINAL PATHOGEN PNL
CampyloBacter Group: NOT DETECTED
Norovirus GI/GII: NOT DETECTED
Rotavirus A: NOT DETECTED
Salmonella species: NOT DETECTED
Shiga Toxin 1: NOT DETECTED
Shiga Toxin 2: NOT DETECTED
Shigella Species: NOT DETECTED
Vibrio Group: NOT DETECTED
Yersinia enterocolitica: NOT DETECTED

## 2024-01-25 NOTE — Progress Notes (Signed)
 Referring Provider: Lauran Pollard, MD Primary Care Physician:  Lauran Pollard, MD Primary GI Physician: Dr. Mordechai April  Chief Complaint  Patient presents with   Follow-up    Follow up. Still having problems with stomach.     HPI:   Erika Downs is a 63 y.o. female with history of COPD, borderline diabetes, HLD, PUD, presenting today for follow-up of nausea, vomiting with hematemesis, epigastric pain, diarrhea.  EGD 11/15/22: 3 cm HH, 30 mm x 30mm non-bleeding cratered gastric ulcer with adherent clot in gastric antrumx with heaped up edges concerning for malignancy s/p biopsy, non-bleeding mm gastric ulcer, red blood in gastric body.  Pathology consistent with ulcer with H pylori.    EUS 12/24/22 at [facility] due to concern for gastric neoplasm:  EGD Impression: - Normal esophagus. - Non- bleeding 15mm gastric ulcer with no stigmata of bleeding. Biopsied. - Non- bleeding 10 mm gastric ulcer with no stigmata of bleeding. Biopsied. - Duodenitis.   EUS Impression: - An ulcer was found in the body of the stomach. Tissue was obtained from this exam, and results are pending. No clear mass lesion was visualized and it is unclear if this is a benign inflammatory ulcer that has eroded to the gastric wall with inflammatory changes or an ulcerated malignancy. This was staged T3 N1 Mx by endosonographic criteria. The staging applies if malignancy is confirmed. - Two abnormal lymph nodes were visualized in the gastrohepatic ligament ( level 18) . Tissue was obtained from this exam, and results are pending. Fine needle biopsy performed. - No lymph nodes were visualized in the celiac region ( level 20) . - There was no evidence of significant pathology in the left lobe of the liver.   Pathology negative for H pylori and dysplasia or malignancy. Aspirate from lymph node also negative for malignancy.    Recommended repeat EGD in 2 months to ensure ulcer healing.     Last seen in the office  12/23/2023 reporting constant epigastric pain with daily nausea/vomiting, intermittent hematemesis since PUD diagnosis in 2024 with worsening symptoms recently.  Also with dysphagia.  Also reported significant watery diarrhea since hospitalization in February 2024 with 10 or more bowel movements per day.  No associated abdominal pain.  Recommendations included updating labs, stool studies, thyroid function, celiac screen, EGD, increase Protonix  to 40 mg twice daily, continue Carafate , viscous lidocaine  as needed.  If stool studies negative, start Imodium.  Will need first colonoscopy once able to tolerate colon prep.   TSH low at 0.29.  CBC, CMP, lipase within normal limits.  Celiac screen negative.  C. difficile, GI pathogen panel, Cryptosporidium, Giardia all negative.  She was given instructions on how to use Imodium for diarrhea.  EGD 12/29/2023: Widely patent Schatzki's ring, nonbleeding cratered gastric ulcer with no stigmata of bleeding on the greater curvature of the stomach measuring 15-20 mm in largest dimension s/p biopsy, nonbleeding cratered gastric ulcer with no stigmata of bleeding in the pylorus measuring 20 mm s/p biopsy, multiple nonbleeding cratered and superficial gastric ulcers with no stigmata of bleeding in the gastric antrum with largest measuring 10 mm.  Diffuse erythema throughout the entire stomach biopsied.  - Recommended Protonix  40 mg twice daily, Carafate  1 g 4 times daily, fasting gastrin if not previously performed.  Recommended patient may need to consider seeing foregut surgeon for antrectomy if biopsies unrevealing.  Suspected she likely had partial gastric outlet obstruction. - Path benign but no report regarding H pylori received.  Today:  Epigastric pain/nausea/vomiting/hematemesis: States she continues with daily symptoms. Within 30 minutes of eating, she vomits. Doesn't matter what she eats. After vomiting several times, she will taste blood and has seen small  amounts at times.   Wt Readings from Last 9 Encounters:  01/26/24 121 lb 6.4 oz (55.1 kg)  12/29/23 121 lb 4.1 oz (55 kg)  12/28/23 122 lb 3.2 oz (55.4 kg)  12/23/23 122 lb 3.2 oz (55.4 kg)  09/06/23 122 lb (55.3 kg)  01/19/23 117 lb (53.1 kg)  12/24/22 113 lb (51.3 kg)  11/30/22 106 lb 3.2 oz (48.2 kg)  11/24/22 112 lb 3.4 oz (50.9 kg)    Dysphagia: Swallowing better since EGD.    Diarrhea: Continues with 10+ Bms daily. Watery.  No brbpr or melena.  No abdominal pain related to bowel movents.   Past Medical History:  Diagnosis Date   Chest pain    COPD (chronic obstructive pulmonary disease) (HCC)    Diabetes (HCC)    borderline   Hyperlipidemia    PUD (peptic ulcer disease)     Past Surgical History:  Procedure Laterality Date   ABDOMINAL HYSTERECTOMY     BLADDER SUSPENSION     ESOPHAGOGASTRODUODENOSCOPY N/A 12/29/2023   Procedure: EGD (ESOPHAGOGASTRODUODENOSCOPY);  Surgeon: Vinetta Greening, DO;  Location: [facility];  Service: Endoscopy;  Laterality: N/A;  1:30 pm, asa 3   ESOPHAGOGASTRODUODENOSCOPY (EGD) WITH PROPOFOL  N/A 11/15/2022   Procedure: ESOPHAGOGASTRODUODENOSCOPY (EGD) WITH PROPOFOL ;  Surgeon: Toledo, Alphonsus Jeans, MD;  Location: [facility] ENDOSCOPY;  Service: Gastroenterology;  Laterality: N/A;   EUS N/A 12/24/2022   Procedure: UPPER ENDOSCOPIC ULTRASOUND (EUS) LINEAR;  Surgeon: Rayford Cake, MD;  Location: [facility] ENDOSCOPY;  Service: Gastroenterology;  Laterality: N/A;   NOVASURE ABLATION      Current Outpatient Medications  Medication Sig Dispense Refill   albuterol  (VENTOLIN  HFA) 108 (90 Base) MCG/ACT inhaler Inhale 2 puffs into the lungs every 6 (six) hours as needed for wheezing or shortness of breath.     alum & mag hydroxide-simeth (MAALOX/MYLANTA) 200-200-20 MG/5 SUSP Apply 1 Application topically as needed. 355 mL 2   aspirin  81 MG chewable tablet Chew 81 mg by mouth daily.     lidocaine  (XYLOCAINE ) 2 % solution Use as directed 15 mLs in the  mouth or throat every 6 (six) hours as needed for mouth pain. 100 mL 1   pantoprazole  (PROTONIX ) 40 MG tablet Take 1 tablet (40 mg total) by mouth 2 (two) times daily before a meal. 60 tablet 3   rosuvastatin (CRESTOR) 20 MG tablet Take 1 tablet by mouth daily.     sucralfate  (CARAFATE ) 1 g tablet Take 1 tablet (1 g total) by mouth 4 (four) times daily -  with meals and at bedtime. 120 tablet 1   No current facility-administered medications for this visit.    Allergies as of 01/26/2024 - Review Complete 01/26/2024  Allergen Reaction Noted   Ibuprofen  Hives and Swelling 09/25/2011   Ivp dye [iodinated contrast media] Hives 04/01/2018   Penicillins Hives and Swelling 09/25/2011   Tylenol  [acetaminophen ] Hives 09/06/2023    Family History  Problem Relation Age of Onset   Cancer - Colon Neg Hx    Stomach cancer Neg Hx     Social History   Socioeconomic History   Marital status: Single    Spouse name: Not on file   Number of children: Not on file   Years of education: Not on file   Highest education level:  Not on file  Occupational History   Not on file  Tobacco Use   Smoking status: Every Day    Current packs/day: 0.50    Types: Cigarettes   Smokeless tobacco: Never  Vaping Use   Vaping status: Never Used  Substance and Sexual Activity   Alcohol use: Not Currently   Drug use: No   Sexual activity: Not on file  Other Topics Concern   Not on file  Social History Narrative   Not on file   Social Drivers of Health   Financial Resource Strain: Not on file  Food Insecurity: No Food Insecurity (11/16/2022)   Hunger Vital Sign    Worried About Running Out of Food in the Last Year: Never true    Ran Out of Food in the Last Year: Never true  Transportation Needs: No Transportation Needs (11/16/2022)   PRAPARE - Administrator, Civil Service (Medical): No    Lack of Transportation (Non-Medical): No  Physical Activity: Inactive (06/23/2023)   [facility] from [facility]   Exercise Vital Sign    Days of Exercise per Week: 0 days    Minutes of Exercise per Session: 0 min  Stress: Not on file  Social Connections: Not on file    Review of Systems: Gen: Denies fever, chills, cold-like symptoms, presyncope, syncope. CV: Denies chest pain, palpitations. Resp: Denies dyspnea, cough. GI: See HPI Heme: See HPI  Physical Exam: BP 134/86 (BP Location: Right Arm, Patient Position: Sitting, Cuff Size: Normal)   Pulse (!) 56   Temp 97.6 F (36.4 C) (Temporal)   Ht 5\' 2"  (1.575 m)   Wt 121 lb 6.4 oz (55.1 kg)   BMI 22.20 kg/m  General:   Alert and oriented. No distress noted. Pleasant and cooperative.  Head:  Normocephalic and atraumatic. Eyes:  Conjuctiva clear without scleral icterus. Heart:  S1, S2 present without murmurs appreciated. Lungs:  Clear to auscultation bilaterally. No wheezes, rales, or rhonchi. No distress.  Abdomen:  +BS, soft, and non-distended.  Moderate to severe tenderness to palpation in the epigastric region.  No rebound or guarding. No HSM or masses noted. Msk:  Symmetrical without gross deformities. Normal posture. Extremities:  Without edema. Neurologic:  Alert and  oriented x4 Psych:  Normal mood and affect.    Assessment:  63 y.o. female with history of COPD, borderline diabetes, HLD, PUD noted in February and March 2024,  presenting today for follow-up of nausea, vomiting with hematemesis, epigastric pain s/p EGD and diarrhea.   EGD 12/29/23 with widely patent Schatzki's ring, nonbleeding cratered gastric ulcer with no stigmata of bleeding on the greater curvature of the stomach measuring 15-20 mm in largest dimension s/p biopsy, nonbleeding cratered gastric ulcer with no stigmata of bleeding in the pylorus measuring 20 mm s/p biopsy, multiple nonbleeding cratered and superficial gastric ulcers with no stigmata of bleeding in the gastric antrum with largest measuring 10 mm. Diffuse erythema throughout the entire stomach  biopsied. Path benign. No report regarding H pylori received, so I called pathology after patient left the office today regarding addendum which was later created stating H pylori was negative.   Refractory PUD:  - This is likely the etiology of her pain, nausea, vomiting.  - Etiology of refractory PUD is unclear.  - Patient denies NSAIDs since March 2024 aside from 81 mg aspirin  which has now also been stopped.  -H. pylori negative. -I do note that she was only taking PPI once a day  prior to my initial visit with her in March 2025, but she was still taking Carafate  4 times daily and famotidine  20 mg twice daily.  However, she was also vomiting frequently, so I am not sure how much medication she was keeping down. Regardless, considering significant/diffuse PUD, I will check a fasting gastrin level. -It is suspected that her extensive peptic ulcer disease is likely creating partial gastric outlet obstruction contributing to her frequent nausea and vomiting.  Ultimately, she may need to see surgery for definitive management, but I would like to see if we can improve vomiting with the addition of Zofran  and continue current regimen for 12 weeks with repeat EGD at that time to evaluate improvement in PUD. However, if symptoms continue to be severe, we may need to go ahead and place surgery referral.   Diarrhea:  Significant watery diarrhea with 10 or more bowel movements per day since February 2024.  No associated abdominal pain, BRBPR, melena, weight loss.  No prior colonoscopy.  Evaluation in March/April 2025 showed low TSH of 0.29.  Celiac screen negative.  C. difficile, GI pathogen panel, Cryptosporidium, Giardia all negative.   Etiology unclear.  May have hyperthyroidism contributing.  Recommend to follow-up with PCP regarding this.  I will evaluate for EPI with fecal elastase.  Also given instructions for Imodium.  She needs a colonoscopy, but would not be able to tolerate the colon prep at this time  in the setting of daily nausea and vomiting.    Plan:  Call pathology to request H pylori results to be added to EGD surgical path report.  Fasting gastrin and fecal elastase Discuss with Dr. Mordechai April possible referral to surgery. Continue pantoprazole  40 mg BID Continue carafate  1 g QID Start Zofran  4 mg q8 hours prn.  Strict NSAID avoidance.  Imodium for diarrhea.  2 pills in the morning, then take 1 pill after each loose stool up to a max of 4 tablets in 24 hours.  Follow-up with PCP on low thyroid function.    Shana Daring, PA-C [facility] 01/26/2024

## 2024-01-26 ENCOUNTER — Encounter: Payer: Self-pay | Admitting: Gastroenterology

## 2024-01-26 ENCOUNTER — Ambulatory Visit (INDEPENDENT_AMBULATORY_CARE_PROVIDER_SITE_OTHER): Admitting: Gastroenterology

## 2024-01-26 VITALS — BP 134/86 | HR 56 | Temp 97.6°F | Ht 62.0 in | Wt 121.4 lb

## 2024-01-26 DIAGNOSIS — R197 Diarrhea, unspecified: Secondary | ICD-10-CM | POA: Diagnosis not present

## 2024-01-26 DIAGNOSIS — K259 Gastric ulcer, unspecified as acute or chronic, without hemorrhage or perforation: Secondary | ICD-10-CM

## 2024-01-26 DIAGNOSIS — R112 Nausea with vomiting, unspecified: Secondary | ICD-10-CM

## 2024-01-26 DIAGNOSIS — R1013 Epigastric pain: Secondary | ICD-10-CM

## 2024-01-26 DIAGNOSIS — K279 Peptic ulcer, site unspecified, unspecified as acute or chronic, without hemorrhage or perforation: Secondary | ICD-10-CM

## 2024-01-26 MED ORDER — ONDANSETRON HCL 4 MG PO TABS: 4.0000 mg | ORAL_TABLET | Freq: Three times a day (TID) | ORAL | 1 refills | Status: AC | PRN

## 2024-01-26 NOTE — Patient Instructions (Addendum)
 Please have labs completed at [facility], fasting.    Your thyroid function was low in March. I encourage you follow-up with your primary doctor as this may be causing your diarrhea.   You can start imodium A-D for diarrhea. Take 2 pills in the morning, then take 1 pill after each loose stool up to a max of 4 tablets in 24 hours.   I will talk with Dr. Mordechai April about what surgeon he recommends for you.   Continue pantoprazole  40 mg twice daily.  Continue Carafate  1 g 4 times daily.  Use Zofran  4 mg every 8 hours as needed for nausea and vomiting.  Continue to avoid all NSAID products including ibuprofen , Aleve, Advil , BC powder, Goody powders, and anything that says "NSAID" on the package.  Shana Daring, PA-C [facility]

## 2024-01-27 ENCOUNTER — Encounter: Payer: Self-pay | Admitting: Gastroenterology

## 2024-01-27 LAB — SURGICAL PATHOLOGY

## 2024-02-06 LAB — GASTRIN: Gastrin: 189 pg/mL — ABNORMAL HIGH (ref <=100)

## 2024-02-08 LAB — PANCREATIC ELASTASE, FECAL: Pancreatic Elastase-1, Stool: 493 ug/g (ref >200)

## 2024-02-15 ENCOUNTER — Ambulatory Visit: Payer: Self-pay | Admitting: Gastroenterology

## 2024-02-15 DIAGNOSIS — R197 Diarrhea, unspecified: Secondary | ICD-10-CM

## 2024-02-25 MED ORDER — DICYCLOMINE HCL 10 MG PO CAPS: 10.0000 mg | ORAL_CAPSULE | Freq: Three times a day (TID) | ORAL | 1 refills | Status: AC

## 2024-02-28 ENCOUNTER — Encounter: Payer: Self-pay | Admitting: *Deleted

## 2024-02-28 ENCOUNTER — Other Ambulatory Visit: Payer: Self-pay | Admitting: *Deleted

## 2024-02-28 MED ORDER — CLENPIQ 10-3.5-12 MG-GM -GM/175ML PO SOLN: 1.0000 | ORAL | 0 refills | Status: DC

## 2024-03-10 ENCOUNTER — Other Ambulatory Visit: Payer: Self-pay

## 2024-03-10 ENCOUNTER — Encounter (HOSPITAL_COMMUNITY)
Admission: RE | Admit: 2024-03-10 | Discharge: 2024-03-10 | Disposition: A | Discharge status: Home / Self Care | Source: Ambulatory Visit | Attending: Internal Medicine | Admitting: Internal Medicine

## 2024-03-10 ENCOUNTER — Encounter (HOSPITAL_COMMUNITY): Payer: Self-pay

## 2024-03-15 ENCOUNTER — Ambulatory Visit (HOSPITAL_COMMUNITY)
Admission: RE | Admit: 2024-03-15 | Discharge: 2024-03-15 | Disposition: A | Discharge status: Home / Self Care | Attending: Internal Medicine | Admitting: Internal Medicine

## 2024-03-15 ENCOUNTER — Ambulatory Visit (HOSPITAL_COMMUNITY): Admitting: Anesthesiology

## 2024-03-15 ENCOUNTER — Encounter (HOSPITAL_COMMUNITY): Payer: Self-pay | Admitting: Internal Medicine

## 2024-03-15 ENCOUNTER — Encounter (HOSPITAL_COMMUNITY)
Admission: RE | Disposition: A | Discharge status: Home / Self Care | Payer: Self-pay | Source: Home / Self Care | Attending: Internal Medicine

## 2024-03-15 DIAGNOSIS — K529 Noninfective gastroenteritis and colitis, unspecified: Secondary | ICD-10-CM | POA: Insufficient documentation

## 2024-03-15 DIAGNOSIS — I10 Essential (primary) hypertension: Secondary | ICD-10-CM

## 2024-03-15 DIAGNOSIS — K648 Other hemorrhoids: Secondary | ICD-10-CM | POA: Diagnosis not present

## 2024-03-15 DIAGNOSIS — J449 Chronic obstructive pulmonary disease, unspecified: Secondary | ICD-10-CM

## 2024-03-15 DIAGNOSIS — F17210 Nicotine dependence, cigarettes, uncomplicated: Secondary | ICD-10-CM

## 2024-03-15 DIAGNOSIS — E119 Type 2 diabetes mellitus without complications: Secondary | ICD-10-CM | POA: Diagnosis not present

## 2024-03-15 DIAGNOSIS — K635 Polyp of colon: Secondary | ICD-10-CM | POA: Diagnosis not present

## 2024-03-15 HISTORY — PX: COLONOSCOPY: SHX5424

## 2024-03-15 SURGERY — COLONOSCOPY
Anesthesia: General

## 2024-03-15 MED ORDER — FENTANYL CITRATE PF 50 MCG/ML IJ SOSY
PREFILLED_SYRINGE | INTRAMUSCULAR | Status: AC
  Filled 2024-03-15: qty 1

## 2024-03-15 MED ORDER — LACTATED RINGERS IV SOLN: INTRAVENOUS | Status: DC | PRN

## 2024-03-15 MED ORDER — FENTANYL CITRATE (PF) 100 MCG/2ML IJ SOLN
25.0000 ug | INTRAMUSCULAR | Status: DC | PRN
  Administered 2024-03-15: 25 ug via INTRAVENOUS

## 2024-03-15 MED ORDER — PROPOFOL 10 MG/ML IV BOLUS
INTRAVENOUS | Status: DC | PRN
Start: 2024-03-15 — End: 2024-03-15
  Administered 2024-03-15: 70 mg via INTRAVENOUS
  Administered 2024-03-15: 125 ug/kg/min via INTRAVENOUS

## 2024-03-15 MED ORDER — LIDOCAINE 2% (20 MG/ML) 5 ML SYRINGE
INTRAMUSCULAR | Status: DC | PRN
  Administered 2024-03-15: 60 mg via INTRAVENOUS

## 2024-03-15 NOTE — Discharge Instructions (Signed)
  Colonoscopy Discharge Instructions  Read the instructions outlined below and refer to this sheet in the next few weeks. These discharge instructions provide you with general information on caring for yourself after you leave the hospital. Your doctor may also give you specific instructions. While your treatment has been planned according to the most current medical practices available, unavoidable complications occasionally occur.   ACTIVITY You may resume your regular activity, but move at a slower pace for the next 24 hours.  Take frequent rest periods for the next 24 hours.  Walking will help get rid of the air and reduce the bloated feeling in your belly (abdomen).  No driving for 24 hours (because of the medicine (anesthesia) used during the test).   Do not sign any important legal documents or operate any machinery for 24 hours (because of the anesthesia used during the test).  NUTRITION Drink plenty of fluids.  You may resume your normal diet as instructed by your doctor.  Begin with a light meal and progress to your normal diet. Heavy or fried foods are harder to digest and may make you feel sick to your stomach (nauseated).  Avoid alcoholic beverages for 24 hours or as instructed.  MEDICATIONS You may resume your normal medications unless your doctor tells you otherwise.  WHAT YOU CAN EXPECT TODAY Some feelings of bloating in the abdomen.  Passage of more gas than usual.  Spotting of blood in your stool or on the toilet paper.  IF YOU HAD POLYPS REMOVED DURING THE COLONOSCOPY: No aspirin  products for 7 days or as instructed.  No alcohol for 7 days or as instructed.  Eat a soft diet for the next 24 hours.  FINDING OUT THE RESULTS OF YOUR TEST Not all test results are available during your visit. If your test results are not back during the visit, make an appointment with your caregiver to find out the results. Do not assume everything is normal if you have not heard from your  caregiver or the medical facility. It is important for you to follow up on all of your test results.  SEEK IMMEDIATE MEDICAL ATTENTION IF: You have more than a spotting of blood in your stool.  Your belly is swollen (abdominal distention).  You are nauseated or vomiting.  You have a temperature over 101.  You have abdominal pain or discomfort that is severe or gets worse throughout the day.   Your colonoscopy revealed 1 polyp(s) which I removed successfully. Await pathology results, my office will contact you.   Mild inflammation on the right side your colon as well.  I took biopsies of your entire colon.  We will call with these results.  Follow-up in GI office in 6 weeks.   I hope you have a great rest of your week!  Rolando Cliche. Mordechai April, D.O. Gastroenterology and Hepatology [facility]

## 2024-03-15 NOTE — OR Nursing (Signed)
 Patient sleeping , woke up and stated that pain is much better.

## 2024-03-15 NOTE — Op Note (Signed)
 [facility] Patient Name: Erika Downs Procedure Date: 03/15/2024 8:52 AM MRN: [id number] Date of Birth: 19-Aug-1961 Attending MD: Rolando Cliche. Mordechai April , [place], [id number] CSN: [id number] Age: 63 Admit Type: Outpatient Procedure:                Colonoscopy Indications:              Chronic diarrhea Providers:                Rolando Cliche. Mordechai April, DO, Vonna Guardian, Frazier Jacob, Technician Referring MD:              Medicines:                See the Anesthesia note for documentation of the                            administered medications Complications:            No immediate complications. Estimated Blood Loss:     Estimated blood loss was minimal. Procedure:                Pre-Anesthesia Assessment:                           - The anesthesia plan was to use monitored                            anesthesia care (MAC).                           After obtaining informed consent, the colonoscope                            was passed under direct vision. Throughout the                            procedure, the patient's blood pressure, pulse, and                            oxygen saturations were monitored continuously. The                            [id number]-[id number] ([id number]) was introduced through the                            anus and advanced to the the terminal ileum, with                            identification of the appendiceal orifice and IC                            valve. The colonoscopy was performed without                            difficulty. The patient tolerated the procedure  well. The quality of the bowel preparation was                            evaluated using the BBPS [facility] [place] Bowel Preparation                            Scale) with scores of: Right Colon = 3, Transverse                            Colon = 3 and Left Colon = 3 (entire mucosa seen                            well with no residual staining,  small fragments of                            stool or opaque liquid). The total BBPS score                            equals 9. Scope In: 9:09:50 AM Scope Out: 9:28:23 AM Scope Withdrawal Time: 0 hours 12 minutes 59 seconds  Total Procedure Duration: 0 hours 18 minutes 33 seconds  Findings:      Non-bleeding internal hemorrhoids were found.      The terminal ileum appeared normal.      Patchy mild inflammation characterized by erosions and erythema was       found in the ascending colon and in the cecum. Biopsies were taken with       a cold forceps for histology.      Biopsies were taken with a cold forceps in the rectum, in the sigmoid       colon, in the descending colon and in the transverse colon for histology.      A 5 mm polyp was found in the sigmoid colon. The polyp was sessile. The       polyp was removed with a cold snare. Resection and retrieval were       complete.      The exam was otherwise without abnormality. Impression:               - Non-bleeding internal hemorrhoids.                           - The examined portion of the ileum was normal.                           - Patchy mild inflammation was found in the                            ascending colon and in the cecum secondary to                            colitis. Biopsied.                           - One 5 mm polyp in the sigmoid colon, removed with  a cold snare. Resected and retrieved.                           - The examination was otherwise normal.                           - Biopsies were taken with a cold forceps for                            histology in the rectum, in the sigmoid colon, in                            the descending colon and in the transverse colon. Moderate Sedation:      Per Anesthesia Care Recommendation:           - Patient has a contact number available for                            emergencies. The signs and symptoms of potential                             delayed complications were discussed with the                            patient. Return to normal activities tomorrow.                            Written discharge instructions were provided to the                            patient.                           - Resume previous diet.                           - Continue present medications.                           - Await pathology results.                           - Repeat colonoscopy date to be determined after                            pending pathology results are reviewed for                            surveillance.                           - Return to GI clinic in 6 weeks. Procedure Code(s):        --- Professional ---                           [phone], Colonoscopy, flexible; with removal of  tumor(s), polyp(s), or other lesion(s) by snare                            technique                           45380, 59, Colonoscopy, flexible; with biopsy,                            single or multiple Diagnosis Code(s):        --- Professional ---                           K64.8, Other hemorrhoids                           K52.9, Noninfective gastroenteritis and colitis,                            unspecified                           D12.5, Benign neoplasm of sigmoid colon CPT copyright 2022 American Medical Association. All rights reserved. The codes documented in this report are preliminary and upon coder review may  be revised to meet current compliance requirements. Rolando Cliche. Mordechai April, DO Rolando Cliche. Mordechai April, DO 03/15/2024 9:31:44 AM This report has been signed electronically. Number of Addenda: 0

## 2024-03-15 NOTE — Anesthesia Preprocedure Evaluation (Signed)
 Anesthesia Evaluation  Patient identified by MRN, date of birth, ID band Patient awake    Reviewed: Allergy & Precautions, H&P , NPO status , Patient's Chart, lab work & pertinent test results, reviewed documented beta blocker date and time   Airway Mallampati: II  TM Distance: >3 FB Neck ROM: full    Dental no notable dental hx.    Pulmonary neg pulmonary ROS, COPD, Current Smoker   Pulmonary exam normal breath sounds clear to auscultation       Cardiovascular Exercise Tolerance: Good hypertension, negative cardio ROS  Rhythm:regular Rate:Normal     Neuro/Psych negative neurological ROS  negative psych ROS   GI/Hepatic negative GI ROS, Neg liver ROS, PUD,,,  Endo/Other  negative endocrine ROSdiabetes    Renal/GU negative Renal ROS  negative genitourinary   Musculoskeletal   Abdominal   Peds  Hematology negative hematology ROS (+) Blood dyscrasia, anemia   Anesthesia Other Findings   Reproductive/Obstetrics negative OB ROS                             Anesthesia Physical Anesthesia Plan  ASA: 3  Anesthesia Plan: General   Post-op Pain Management:    Induction:   PONV Risk Score and Plan: Propofol  infusion  Airway Management Planned:   Additional Equipment:   Intra-op Plan:   Post-operative Plan:   Informed Consent: I have reviewed the patients History and Physical, chart, labs and discussed the procedure including the risks, benefits and alternatives for the proposed anesthesia with the patient or authorized representative who has indicated his/her understanding and acceptance.     Dental Advisory Given  Plan Discussed with: CRNA  Anesthesia Plan Comments:        Anesthesia Quick Evaluation

## 2024-03-15 NOTE — H&P (Signed)
 Primary Care Physician:  Lauran Pollard, MD Primary Gastroenterologist:  Dr. Mordechai April  Pre-Procedure History & Physical: HPI:  Erika Downs is a 63 y.o. female is here for a colonoscopy for chronic diarrhea   Past Medical History:  Diagnosis Date   Chest pain    COPD (chronic obstructive pulmonary disease) (HCC)    Diabetes (HCC)    borderline   Hyperlipidemia    PUD (peptic ulcer disease)     Past Surgical History:  Procedure Laterality Date   ABDOMINAL HYSTERECTOMY     BLADDER SUSPENSION     ESOPHAGOGASTRODUODENOSCOPY N/A 12/29/2023   Procedure: EGD (ESOPHAGOGASTRODUODENOSCOPY);  Surgeon: Vinetta Greening, DO;  Location: [facility];  Service: Endoscopy;  Laterality: N/A;  1:30 pm, asa 3   ESOPHAGOGASTRODUODENOSCOPY (EGD) WITH PROPOFOL  N/A 11/15/2022   Procedure: ESOPHAGOGASTRODUODENOSCOPY (EGD) WITH PROPOFOL ;  Surgeon: Toledo, Alphonsus Jeans, MD;  Location: [facility] ENDOSCOPY;  Service: Gastroenterology;  Laterality: N/A;   EUS N/A 12/24/2022   Procedure: UPPER ENDOSCOPIC ULTRASOUND (EUS) LINEAR;  Surgeon: Rayford Cake, MD;  Location: [facility] ENDOSCOPY;  Service: Gastroenterology;  Laterality: N/A;   NOVASURE ABLATION      Prior to Admission medications   Medication Sig Start Date End Date Taking? Authorizing Provider  albuterol  (VENTOLIN  HFA) 108 (90 Base) MCG/ACT inhaler Inhale 2 puffs into the lungs every 6 (six) hours as needed for wheezing or shortness of breath.   Yes [provider]  alum & mag hydroxide-simeth (MAALOX/MYLANTA) 200-200-20 MG/5 SUSP Apply 1 Application topically as needed. 09/06/23  Yes Stafford Eagles, PA-C  aspirin  81 MG chewable tablet Chew 81 mg by mouth daily.   Yes [provider]  dicyclomine  (BENTYL ) 10 MG capsule Take 1 capsule (10 mg total) by mouth 4 (four) times daily -  before meals and at bedtime. 02/25/24  Yes Evander Hills, PA-C  ondansetron  (ZOFRAN ) 4 MG tablet Take 1 tablet (4 mg total) by mouth every 8 (eight) hours  as needed for nausea or vomiting. 01/26/24  Yes Evander Hills, PA-C  pantoprazole  (PROTONIX ) 40 MG tablet Take 1 tablet (40 mg total) by mouth 2 (two) times daily before a meal. 12/23/23  Yes Shana Daring S, PA-C  rosuvastatin (CRESTOR) 20 MG tablet Take 1 tablet by mouth daily. 11/29/23 11/23/24 Yes [provider]  lidocaine  (XYLOCAINE ) 2 % solution Use as directed 15 mLs in the mouth or throat every 6 (six) hours as needed for mouth pain. 12/23/23   Evander Hills, PA-C  Sod Picosulfate-Mag Ox-Cit Acd (CLENPIQ ) 10-3.5-12 MG-GM -GM/175ML SOLN Take 1 kit by mouth as directed. 02/28/24   Vinetta Greening, DO  sucralfate  (CARAFATE ) 1 g tablet Take 1 tablet (1 g total) by mouth 4 (four) times daily -  with meals and at bedtime. 09/06/23 01/26/24  Deeanna Farm L, PA-C    Allergies as of 02/28/2024 - Review Complete 01/26/2024  Allergen Reaction Noted   Ibuprofen  Hives and Swelling 09/25/2011   Ivp dye [iodinated contrast media] Hives 04/01/2018   Penicillins Hives and Swelling 09/25/2011   Tylenol  [acetaminophen ] Hives 09/06/2023    Family History  Problem Relation Age of Onset   Cancer - Colon Neg Hx    Stomach cancer Neg Hx     Social History   Socioeconomic History   Marital status: Single    Spouse name: Not on file   Number of children: Not on file   Years of education: Not on file   Highest education level: Not  on file  Occupational History   Not on file  Tobacco Use   Smoking status: Every Day    Current packs/day: 0.50    Types: Cigarettes   Smokeless tobacco: Never  Vaping Use   Vaping status: Never Used  Substance and Sexual Activity   Alcohol use: Not Currently   Drug use: No   Sexual activity: Not on file  Other Topics Concern   Not on file  Social History Narrative   Not on file   Social Drivers of Health   Financial Resource Strain: Not on file  Food Insecurity: No Food Insecurity (11/16/2022)   Hunger Vital Sign    Worried About Running Out  of Food in the Last Year: Never true    Ran Out of Food in the Last Year: Never true  Transportation Needs: No Transportation Needs (11/16/2022)   PRAPARE - Administrator, Civil Service (Medical): No    Lack of Transportation (Non-Medical): No  Physical Activity: Inactive (06/23/2023)   [facility]   Exercise Vital Sign    On average, how many days per week do you engage in moderate to strenuous exercise (like a brisk walk)?: 0 days    On average, how many minutes do you engage in exercise at this level?: 0 min  Stress: Not on file  Social Connections: Not on file  Intimate Partner Violence: Not At Risk (11/16/2022)   Humiliation, Afraid, Rape, and Kick questionnaire    Fear of Current or Ex-Partner: No    Emotionally Abused: No    Physically Abused: No    Sexually Abused: No    Review of Systems: See HPI, otherwise negative ROS  Physical Exam: Vital signs in last 24 hours: Temp:  [97.7 F (36.5 C)] 97.7 F (36.5 C) (06/18 0753) Pulse Rate:  [53-59] 53 (06/18 0800) Resp:  [23] 23 (06/18 0753) BP: (163-194)/(83-111) 163/111 (06/18 0800) SpO2:  [96 %-97 %] 96 % (06/18 0800) Weight:  [53.1 kg] 53.1 kg (06/18 0753)   General:   Alert,  Well-developed, well-nourished, pleasant and cooperative in NAD Head:  Normocephalic and atraumatic. Eyes:  Sclera clear, no icterus.   Conjunctiva pink. Ears:  Normal auditory acuity. Nose:  No deformity, discharge,  or lesions. Msk:  Symmetrical without gross deformities. Normal posture. Extremities:  Without clubbing or edema. Neurologic:  Alert and  oriented x4;  grossly normal neurologically. Skin:  Intact without significant lesions or rashes. Psych:  Alert and cooperative. Normal mood and affect.  Impression/Plan: Erika Downs is here for a colonoscopy to be performed for chronic diarrhea  The risks of the procedure including infection, bleed, or perforation as well as benefits, limitations, alternatives  and imponderables have been reviewed with the patient. Questions have been answered. All parties agreeable.

## 2024-03-15 NOTE — Transfer of Care (Addendum)
 Immediate Anesthesia Transfer of Care Note  Patient: Erika Downs  Procedure(s) Performed: COLONOSCOPY  Patient Location: Endoscopy Unit  Anesthesia Type:General  Level of Consciousness: awake, alert , oriented, and patient cooperative  Airway & Oxygen Therapy: Patient Spontanous Breathing  Post-op Assessment: Report given to RN, Post -op Vital signs reviewed and stable, and Patient moving all extremities X 4  Post vital signs: Reviewed and stable  Last Vitals:  Vitals Value Taken Time  BP 141/79 03/15/24   0936  Temp 36.4 03/15/24   0936  Pulse 60 03/15/24   0936  Resp 18 03/15/24   0936  SpO2 98% 03/15/24   0936    Last Pain:  Vitals:   03/15/24 0904  TempSrc:   PainSc: 7          Complications: No notable events documented.

## 2024-03-16 ENCOUNTER — Encounter (HOSPITAL_COMMUNITY): Payer: Self-pay | Admitting: Internal Medicine

## 2024-03-16 LAB — SURGICAL PATHOLOGY

## 2024-03-17 NOTE — Anesthesia Postprocedure Evaluation (Signed)
 Anesthesia Post Note  Patient: Erika Downs  Procedure(s) Performed: COLONOSCOPY  Patient location during evaluation: Phase II Anesthesia Type: General Level of consciousness: awake Pain management: pain level controlled Vital Signs Assessment: post-procedure vital signs reviewed and stable Respiratory status: spontaneous breathing and respiratory function stable Cardiovascular status: blood pressure returned to baseline and stable Postop Assessment: no headache and no apparent nausea or vomiting Anesthetic complications: no Comments: Late entry   No notable events documented.   Last Vitals:  Vitals:   03/15/24 0800 03/15/24 0936  BP: (!) 163/111 (!) 141/79  Pulse: (!) 53 60  Resp:  18  Temp:  (!) 36.4 C  SpO2: 96% 98%    Last Pain:  Vitals:   03/16/24 1502  TempSrc:   PainSc: 0-No pain                 Coretha Dew

## 2024-03-20 ENCOUNTER — Ambulatory Visit: Payer: Self-pay | Admitting: Internal Medicine

## 2024-04-15 ENCOUNTER — Other Ambulatory Visit: Payer: Self-pay | Admitting: Gastroenterology

## 2024-04-15 DIAGNOSIS — Z8711 Personal history of peptic ulcer disease: Secondary | ICD-10-CM

## 2024-04-15 DIAGNOSIS — R1013 Epigastric pain: Secondary | ICD-10-CM

## 2024-04-24 ENCOUNTER — Telehealth: Payer: Self-pay

## 2024-04-24 NOTE — Telephone Encounter (Signed)
 Pt appt scheduled

## 2024-04-24 NOTE — Progress Notes (Unsigned)
 Referring Provider: Trudy Vaughn FALCON, MD Primary Care Physician:  Trudy Vaughn FALCON, MD Primary GI Physician: Dr. Cindie  Chief Complaint  Patient presents with   Follow-up    Follow up. Still hurting in her stomach and chest.     HPI:   Erika Downs is a 63 y.o. female with history of COPD, borderline diabetes, HLD, PUD noted in February and March 2024,  presenting today for follow-up of PUD, nausea, vomiting, hematemesis, epigastric pain, and diarrhea.   EGD 11/15/22: 3 cm HH, 30 mm x 30mm non-bleeding cratered gastric ulcer with adherent clot in gastric antrumx with heaped up edges concerning for malignancy s/p biopsy, non-bleeding mm gastric ulcer, red blood in gastric body.  Pathology consistent with ulcer with H pylori.    EUS 12/24/22 at [facility] due to concern for gastric neoplasm:  EGD Impression: - Normal esophagus. - Non- bleeding 15mm gastric ulcer with no stigmata of bleeding. Biopsied. - Non- bleeding 10 mm gastric ulcer with no stigmata of bleeding. Biopsied. - Duodenitis.   EUS Impression: - An ulcer was found in the body of the stomach. Tissue was obtained from this exam, and results are pending. No clear mass lesion was visualized and it is unclear if this is a benign inflammatory ulcer that has eroded to the gastric wall with inflammatory changes or an ulcerated malignancy. This was staged T3 N1 Mx by endosonographic criteria. The staging applies if malignancy is confirmed. - Two abnormal lymph nodes were visualized in the gastrohepatic ligament ( level 18) . Tissue was obtained from this exam, and results are pending. Fine needle biopsy performed. - No lymph nodes were visualized in the celiac region ( level 20) . - There was no evidence of significant pathology in the left lobe of the liver.   Pathology negative for H pylori and dysplasia or malignancy. Aspirate from lymph node also negative for malignancy.    Recommended repeat EGD in 2 months to ensure ulcer  healing.    EGD 12/29/2023: Widely patent Schatzki's ring, nonbleeding cratered gastric ulcer with no stigmata of bleeding on the greater curvature of the stomach measuring 15-20 mm in largest dimension s/p biopsy, nonbleeding cratered gastric ulcer with no stigmata of bleeding in the pylorus measuring 20 mm s/p biopsy, multiple nonbleeding cratered and superficial gastric ulcers with no stigmata of bleeding in the gastric antrum with largest measuring 10 mm.  Diffuse erythema throughout the entire stomach biopsied.  - Recommended Protonix  40 mg twice daily, Carafate  1 g 4 times daily, fasting gastrin if not previously performed.  Recommended patient may need to consider seeing foregut surgeon for antrectomy if biopsies unrevealing.  Suspected she likely had partial gastric outlet obstruction. - Path benign, no H pylori.   Last seen in the office 01/26/24: Continued with daily nausea and vomiting. Recommended checking fasting gastin and adding Zofran  to control nausea/vomiting while continuing PPI BID for 12 weeks with repeat EGD at that time. If persistent symptoms, would need to refer to foregut surgeon.   She also reported significant watery diarrhea with 10 or more bowel movements per day since February 2024. No associated abdominal pain, BRBPR, melena, weight loss. Evaluation in March/April 2025 showed low TSH of 0.29. Celiac screen negative. C. difficile, GI pathogen panel, Cryptosporidium, Giardia all negative. Recommended checking fecal elastase, imodium, and follow-up with PCP on possible hyperthyroidism.    Fasting gastrin slightly elevated at 189.  Fecal elastase normal.   She was scheduled for colonoscopy.  Colonoscopy 03/15/24: - Non- bleeding internal hemorrhoids. - The examined portion of the ileum was normal. - Patchy mild inflammation was found in the ascending colon and in the cecum secondary to colitis. Biopsied. - One 5 mm polyp in the sigmoid colon, removed with a cold snare.  Resected and retrieved. - The examination was otherwise normal. - Biopsies were taken with a cold forceps for histology in the rectum, in the sigmoid colon, in the descending colon and in the transverse colon. - Cecal and ascending colon biopsy with focal acute colitis. Other biopsies from transverse, descending, sigmoid, and recum were benign. Sigmoid polyp was hyperplastic.  - Dr. Cindie stated acute colitis could be resolving infection versus medication induced. Recommend she avoid NSAIDs.     Today:  PUD, nausea, vomiting, hematemesis, epigastric pain: Vomiting 4-5 times a week.  Occasional streaks of blood in vomit.  Zofran  once a day. Did better with twice a day but is tired of taking medications.  Taking pantoprazole  BID and Carafate  QID.  No improvement in abdominal pain.  Wants referral to surgery.   Diarrhea:  10 bowel movements per day if not more. Watery. Urgent. Taking bentyl  BID. Imodium 30 ml twice a day.  Not affected by what she eats. No pain. No brbpr or melena.    NSAIDs: None   Wt Readings from Last 3 Encounters:  04/26/24 121 lb 12.8 oz (55.2 kg)  03/15/24 117 lb (53.1 kg)  03/10/24 117 lb (53.1 kg)      Past Medical History:  Diagnosis Date   Chest pain    COPD (chronic obstructive pulmonary disease) (HCC)    Diabetes (HCC)    borderline   Hyperlipidemia    PUD (peptic ulcer disease)     Past Surgical History:  Procedure Laterality Date   ABDOMINAL HYSTERECTOMY     BLADDER SUSPENSION     COLONOSCOPY N/A 03/15/2024   Procedure: COLONOSCOPY;  Surgeon: Cindie Carlin POUR, DO;  [facility];  Service: Endoscopy;  Laterality: N/A;  915am, ASA 2   ESOPHAGOGASTRODUODENOSCOPY N/A 12/29/2023   Procedure: EGD (ESOPHAGOGASTRODUODENOSCOPY);  Surgeon: Cindie Carlin POUR, DO;  [facility];  Service: Endoscopy;  Laterality: N/A;  1:30 pm, asa 3   ESOPHAGOGASTRODUODENOSCOPY (EGD) WITH PROPOFOL  N/A 11/15/2022   Procedure:  ESOPHAGOGASTRODUODENOSCOPY (EGD) WITH PROPOFOL ;  Surgeon: Toledo, Ladell POUR, MD;  Location: [facility];  Service: Gastroenterology;  Laterality: N/A;   EUS N/A 12/24/2022   Procedure: UPPER ENDOSCOPIC ULTRASOUND (EUS) LINEAR;  Surgeon: Elta Fonda SQUIBB, MD;  Location: [facility] ENDOSCOPY;  Service: Gastroenterology;  Laterality: N/A;   NOVASURE ABLATION      Current Outpatient Medications  Medication Sig Dispense Refill   albuterol  (VENTOLIN  HFA) 108 (90 Base) MCG/ACT inhaler Inhale 2 puffs into the lungs every 6 (six) hours as needed for wheezing or shortness of breath.     alum & mag hydroxide-simeth (MAALOX/MYLANTA) 200-200-20 MG/5 SUSP Apply 1 Application topically as needed. 355 mL 2   aspirin  81 MG chewable tablet Chew 81 mg by mouth daily.     dicyclomine  (BENTYL ) 10 MG capsule Take 1 capsule (10 mg total) by mouth 4 (four) times daily -  before meals and at bedtime. 120 capsule 1   famotidine  (PEPCID ) 20 MG tablet Take 20 mg by mouth 2 (two) times daily.     JARDIANCE 10 MG TABS tablet Take 10 mg by mouth daily.     lidocaine  (XYLOCAINE ) 2 % solution Use as directed 15 mLs in the  mouth or throat every 6 (six) hours as needed for mouth pain. 100 mL 1   ondansetron  (ZOFRAN ) 4 MG tablet Take 1 tablet (4 mg total) by mouth every 8 (eight) hours as needed for nausea or vomiting. 60 tablet 1   pantoprazole  (PROTONIX ) 40 MG tablet TAKE 1 TABLET BY MOUTH TWICE DAILY BEFORE A MEAL 180 tablet 1   rosuvastatin (CRESTOR) 20 MG tablet Take 1 tablet by mouth daily.     sucralfate  (CARAFATE ) 1 g tablet Take 1 tablet (1 g total) by mouth 4 (four) times daily -  with meals and at bedtime. 120 tablet 1   traZODone (DESYREL) 50 MG tablet Take 25-50 mg by mouth at bedtime as needed.     No current facility-administered medications for this visit.    Allergies as of 04/26/2024 - Review Complete 04/26/2024  Allergen Reaction Noted   Ibuprofen  Hives and Swelling 09/25/2011   Ivp dye [iodinated contrast  media] Hives 04/01/2018   Penicillins Hives and Swelling 09/25/2011   Tylenol  [acetaminophen ] Hives 09/06/2023    Family History  Problem Relation Age of Onset   Cancer - Colon Neg Hx    Stomach cancer Neg Hx     Social History   Socioeconomic History   Marital status: Single    Spouse name: Not on file   Number of children: Not on file   Years of education: Not on file   Highest education level: Not on file  Occupational History   Not on file  Tobacco Use   Smoking status: Every Day    Current packs/day: 0.50    Types: Cigarettes   Smokeless tobacco: Never  Vaping Use   Vaping status: Never Used  Substance and Sexual Activity   Alcohol use: Not Currently   Drug use: No   Sexual activity: Not on file  Other Topics Concern   Not on file  Social History Narrative   Not on file   Social Drivers of Health   Financial Resource Strain: Not on file  Food Insecurity: No Food Insecurity (11/16/2022)   Hunger Vital Sign    Worried About Running Out of Food in the Last Year: Never true    Ran Out of Food in the Last Year: Never true  Transportation Needs: No Transportation Needs (11/16/2022)   PRAPARE - Administrator, Civil Service (Medical): No    Lack of Transportation (Non-Medical): No  Physical Activity: Inactive (06/23/2023)   Received from [facility]   Exercise Vital Sign    On average, how many days per week do you engage in moderate to strenuous exercise (like a brisk walk)?: 0 days    On average, how many minutes do you engage in exercise at this level?: 0 min  Stress: Not on file  Social Connections: Not on file    Review of Systems: Gen: Denies fever, chills, cold or flu like symptoms, pre-syncope, or syncope.  GI: See HPI Heme: See HPI  Physical Exam: BP 126/76 (BP Location: Left Arm, Patient Position: Sitting, Cuff Size: Normal)   Pulse 68   Temp 97.8 F (36.6 C) (Temporal)   Ht 5' 2 (1.575 m)   Wt 121 lb 12.8 oz (55.2 kg)   BMI  22.28 kg/m  General:   Alert and oriented. No distress noted. Pleasant and cooperative.  Head:  Normocephalic and atraumatic. Eyes:  Conjuctiva clear without scleral icterus. Heart:  S1, S2 present without murmurs appreciated. Lungs:  Clear to auscultation bilaterally.  No wheezes, rales, or rhonchi. No distress.  Abdomen:  +BS, soft, non-tender and non-distended. No rebound or guarding. No HSM or masses noted. Msk:  Symmetrical without gross deformities. Normal posture. Extremities:  Without edema. Neurologic:  Alert and  oriented x4 Psych:  Normal mood and affect.    Assessment:  63 y.o. female with history of COPD, borderline diabetes, HLD, PUD noted in February and March 2024,  presenting today for follow-up of refractory PUD, nausea, vomiting with hematemesis, epigastric pain, and diarrhea.   Refractory PUD:  - This is likely the etiology of her epigastric pain, nausea, vomiting.  - Etiology of refractory PUD is unclear.  - Patient denies NSAIDs since March 2024 aside from 81 mg aspirin  which has now also been stopped.  -H. pylori negative. - Previously only taking PPI once a day prior to initial visit with her in March 2025.  - Last EGD April 2025 with nonbleeding cratered gastric ulcer measuring 15-20 mm in largest dimension, nonbleeding cratered gastric ulcer measuring 20 mm, multiple nonbleeding cratered and superficial gastric ulcers in the gastric antrum with largest measuring 10 mm.  Diffuse erythremia throughout the entire stomach.  Pathology benign, no H. pylori. - She has been on PPI twice daily, Carafate  4 times daily, but continues with daily epigastric pain, nausea, vomiting, and intermittent streaks of blood in her emesis.  She was prescribed Zofran  to help limit nausea/vomiting in efforts to allow PPI and Carafate  to be more effective.  When taking Zofran  twice daily, she reports this helped resolve nausea/vomiting, but now taking once a day as she is tired of taking  medications and is back to vomiting 4-5 times a week.  - It has been suspected that her extensive peptic ulcer disease is likely creating partial gastric outlet obstruction contributing to her frequent nausea and vomiting.  As she has not had any significant improvement with PPI twice daily and Carafate  4 times daily, I suspect ulcers are still present.  She likely needs to see a foregut surgeon for definitive management.  Discussed with Dr. Cindie, recommended referral to [facility].    Diarrhea:  Significant watery diarrhea with 10 or more bowel movements per day since February 2024. No associated abdominal pain, BRBPR, melena, weight loss.  No improvement with dicyclomine  twice daily or Imodium.  Stool test including C. difficile, GI pathogen panel, Cryptosporidium, Giardia all negative.  Celiac screen, fecal elastase normal.  TSH was found to be low at 0.29 which may be contributing to her symptoms, but she was also found to have nonspecific colitis involving the cecum and ascending colon on recent colonoscopy in June 2025.  Discussed with Dr. Cindie.  Recommended trial of budesonide  9 mg daily x 8 weeks. I jhave also recommended checking CRP and fecal calprotectin.    Plan:  CRP and fecal calprotectin Start budesonide  9 mg daily x 8 weeks. Continue dicyclomine .  Can take every 6 hours. Continue Imodium as needed Continue pantoprazole  40 mg twice daily Continue Carafate  4 times daily Resume taking Zofran  twice a day as this controlled nausea/vomiting. Refer to [facility] surgery for consideration of partial gastrectomy due to refractory peptic ulcer disease. Follow-up in the office in 8 weeks with Dr. Cindie.    Josette Centers, PA-C [facility] Gastroenterology 04/26/2024

## 2024-04-26 ENCOUNTER — Telehealth: Payer: Self-pay | Admitting: Gastroenterology

## 2024-04-26 ENCOUNTER — Encounter: Payer: Self-pay | Admitting: Gastroenterology

## 2024-04-26 ENCOUNTER — Ambulatory Visit (INDEPENDENT_AMBULATORY_CARE_PROVIDER_SITE_OTHER): Admitting: Gastroenterology

## 2024-04-26 VITALS — BP 126/76 | HR 68 | Temp 97.8°F | Ht 62.0 in | Wt 121.8 lb

## 2024-04-26 DIAGNOSIS — K529 Noninfective gastroenteritis and colitis, unspecified: Secondary | ICD-10-CM

## 2024-04-26 DIAGNOSIS — R1013 Epigastric pain: Secondary | ICD-10-CM | POA: Diagnosis not present

## 2024-04-26 DIAGNOSIS — K259 Gastric ulcer, unspecified as acute or chronic, without hemorrhage or perforation: Secondary | ICD-10-CM

## 2024-04-26 DIAGNOSIS — K279 Peptic ulcer, site unspecified, unspecified as acute or chronic, without hemorrhage or perforation: Secondary | ICD-10-CM

## 2024-04-26 DIAGNOSIS — R197 Diarrhea, unspecified: Secondary | ICD-10-CM

## 2024-04-26 DIAGNOSIS — R112 Nausea with vomiting, unspecified: Secondary | ICD-10-CM

## 2024-04-26 MED ORDER — BUDESONIDE ER 9 MG PO TB24: 9.0000 mg | ORAL_TABLET | Freq: Every day | ORAL | 1 refills | Status: AC

## 2024-04-26 NOTE — Telephone Encounter (Signed)
 Please place referral to [facility], Dr. Deward Purchase Stechschulte, for refractory PUD.

## 2024-04-26 NOTE — Patient Instructions (Addendum)
 Please have labs and stool test completed at [facility].   You can increase dicyclomine  up to 4 times a day, every 6 hours.   Continue imodium.  Continue pantoprazole  twice daily and Carafate  4 times daily.  Resume taking Zofran  twice a day as this worked better for you.  I will discuss referral to surgery for stomach ulcers and possible treatment of diarrhea with budesonide  with Dr. Cindie.   Josette Centers, PA-C [facility] Gastroenterology

## 2024-04-27 ENCOUNTER — Other Ambulatory Visit: Payer: Self-pay | Admitting: *Deleted

## 2024-04-27 NOTE — Addendum Note (Signed)
 Addended by: JEANELL GRAEME RAMAN on: 04/27/2024 08:04 AM   Modules accepted: Orders

## 2024-04-27 NOTE — Telephone Encounter (Signed)
 Referral sent

## 2024-04-28 ENCOUNTER — Other Ambulatory Visit: Payer: Self-pay

## 2024-04-28 DIAGNOSIS — R079 Chest pain, unspecified: Secondary | ICD-10-CM

## 2024-05-09 ENCOUNTER — Telehealth: Payer: Self-pay | Admitting: Gastroenterology

## 2024-05-09 NOTE — Telephone Encounter (Signed)
 Reviewed office visit note with [facility] surgery dated 05/05/2024.  Patient was seen for refractory peptic ulcer disease.  Referral was placed to thoracic surgeons in [facility] for evaluation and potential vagotomy.  No additional recommendations at this time.

## 2024-05-24 NOTE — Progress Notes (Unsigned)
 Patient ID: Erika Downs, female   DOB: [birth year], 63 y.o.   MRN: [id number]  Reason for Consult: No chief complaint on file.   Referred by Trudy Vaughn FALCON, MD  Subjective:     HPI Erika Downs is a 63 y.o. female presenting for evaluation of AAA.  She has had multiple abdominal CT scans performed in the last few years due to lower abdominal pain and urologic issues. Since 2023 her infrarenal abdominal aortic aneurysm has grown from 3.1 to 3.5 cm.  She denies any new or unusual abdominal or back pain.  She recently had a cancer screening CT scan in July.  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  He is a current smoker.  Past Medical History:  Diagnosis Date   Chest pain    COPD (chronic obstructive pulmonary disease) (HCC)    Diabetes (HCC)    borderline   Hyperlipidemia    PUD (peptic ulcer disease)    Family History  Problem Relation Age of Onset   Cancer - Colon Neg Hx    Stomach cancer Neg Hx    Past Surgical History:  Procedure Laterality Date   ABDOMINAL HYSTERECTOMY     BLADDER SUSPENSION     COLONOSCOPY N/A 03/15/2024   Procedure: COLONOSCOPY;  Surgeon: Cindie Carlin POUR, DO;  [facility];  Service: Endoscopy;  Laterality: N/A;  915am, ASA 2   ESOPHAGOGASTRODUODENOSCOPY N/A 12/29/2023   Procedure: EGD (ESOPHAGOGASTRODUODENOSCOPY);  Surgeon: Cindie Carlin POUR, DO;  [facility];  Service: Endoscopy;  Laterality: N/A;  1:30 pm, asa 3   ESOPHAGOGASTRODUODENOSCOPY (EGD) WITH PROPOFOL  N/A 11/15/2022   Procedure: ESOPHAGOGASTRODUODENOSCOPY (EGD) WITH PROPOFOL ;  Surgeon: Toledo, Ladell POUR, MD;  Location: [facility] END[facility]COPY;  Service: Gastroenterology;  Laterality: N/A;   EUS N/A 12/24/2022   Procedure: UPPER ENDOSCOPIC ULTRASOUND (EUS) LINEAR;  Surgeon: Elta Fonda SQUIBB, MD;  Location: [facility] END[facility];  Service:  Gastroenterology;  Laterality: N/A;   NOVASURE ABLATION      Short Social History:  Social History   Tobacco Use   Smoking status: Every Day    Current packs/day: 0.50    Types: Cigarettes   Smokeless tobacco: Never  Substance Use Topics   Alcohol use: Not Currently    Allergies  Allergen Reactions   Ibuprofen  Hives and Swelling   Ivp Dye [Iodinated Contrast Media] Hives    PT states she was injected at [facility] and had hives   Penicillins Hives and Swelling    Has patient had a PCN reaction causing immediate rash, facial/tongue/throat swelling, SOB or lightheadedness with hypotension: Yes Has patient had a PCN reaction causing severe rash involving mucus membranes or skin necrosis: No Has patient had a PCN reaction that required hospitalization No Has patient had a PCN reaction occurring within the last 10 years: No If all of the above answers are NO, then may proceed with Cephalosporin use.    Tylenol  [Acetaminophen ] Hives    Current Outpatient Medications  Medication Sig Dispense Refill   albuterol  (VENTOLIN  HFA) 108 (90 Base) MCG/ACT inhaler Inhale 2 puffs into the lungs every 6 (six) hours as needed for wheezing or shortness of breath.     alum & mag hydroxide-simeth (MAALOX/MYLANTA) 200-200-20 MG/5 SUSP Apply 1 Application topically as needed. 355 mL 2   aspirin  81 MG chewable tablet Chew 81 mg  by mouth daily.     Budesonide  ER 9 MG TB24 Take 1 tablet (9 mg total) by mouth daily. 30 tablet 1   dicyclomine  (BENTYL ) 10 MG capsule Take 1 capsule (10 mg total) by mouth 4 (four) times daily -  before meals and at bedtime. 120 capsule 1   famotidine  (PEPCID ) 20 MG tablet Take 20 mg by mouth 2 (two) times daily.     JARDIANCE 10 MG TABS tablet Take 10 mg by mouth daily.     lidocaine  (XYLOCAINE ) 2 % solution Use as directed 15 mLs in the mouth or throat every 6 (six) hours as needed for mouth pain. 100 mL 1   ondansetron  (ZOFRAN ) 4 MG tablet Take 1 tablet (4 mg total) by mouth  every 8 (eight) hours as needed for nausea or vomiting. 60 tablet 1   pantoprazole  (PROTONIX ) 40 MG tablet TAKE 1 TABLET BY MOUTH TWICE DAILY BEFORE A MEAL 180 tablet 1   rosuvastatin (CRESTOR) 20 MG tablet Take 1 tablet by mouth daily.     sucralfate  (CARAFATE ) 1 g tablet Take 1 tablet (1 g total) by mouth 4 (four) times daily -  with meals and at bedtime. 120 tablet 1   traZODone (DESYREL) 50 MG tablet Take 25-50 mg by mouth at bedtime as needed.     No current facility-administered medications for this visit.    REVIEW OF SYSTEMS  All other systems were reviewed and are negative     Objective:  Objective   Vitals:   05/26/24 0908  BP: (!) 198/112  Pulse: (!) 58  Temp: 97.6 F (36.4 C)  TempSrc: Temporal  SpO2: 94%  Weight: 120 lb (54.4 kg)   Body mass index is 21.95 kg/m.  Physical Exam General: no acute distress Cardiac: hemodynamically stable Pulm: normal work of breathing Abdomen: non-tender, no pulsatile mass Neuro: alert, no focal deficit Extremities: no edema, cyanosis or wounds Vascular:   Right: Palpable femoral, DP, PT, radial  Left: Palpable femoral, DP, PT, radial  Data: CT abdomen pelvis without contrast from January 2025 independently reviewed Infrarenal AAA with maximal diameter of 3.5 cm, compared to study from 2023 there is only grown about 4 mm.  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  CMP reviewed, creatinine 0.62  AAA duplex Abdominal Aorta Findings:  +-------------+-------+----------+----------+--------+--------+--------+  Location    AP (cm)Trans (cm)PSV (cm/s)WaveformThrombusComments  +-------------+-------+----------+----------+--------+--------+--------+  Proximal    2.44   2.41      59                                   +-------------+-------+----------+----------+--------+--------+--------+  Mid         3.49   3.57      33                Present           +-------------+-------+----------+----------+--------+--------+--------+  Distal      2.18   2.45      36                                  +-------------+-------+----------+----------+--------+--------+--------+  RT CIA Distal1.0    0.9       242                                 +-------------+-------+----------+----------+--------+--------+--------+  LT CIA Prox  1.1    1.1       210                                 +-------------+-------+----------+----------+--------+--------+--------+       Assessment/Plan:   MAISHA BOGEN is a 64 y.o. female with a 3.5 cm AAA.  She is asymptomatic. We reviewed the threshold to start discussions of treatment being about 5 cm in females.  The typical follow-up for patient with a AAA of 3.5 would be 3 years. 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  Encouraged to continue aspirin  and statin.  Recommendations to optimize cardiovascular risk: Abstinence from all tobacco products. Blood glucose control with goal A1c < 7%. Blood pressure control with goal blood pressure < 140/90 mmHg. Lipid reduction therapy with goal LDL-C <100 mg/dL  Aspirin  81mg  PO QD.  Atorvastatin 40-80mg  PO QD (or other high intensity statin therapy).   Norman GORMAN Serve MD [facility]

## 2024-05-26 ENCOUNTER — Ambulatory Visit (HOSPITAL_COMMUNITY)
Admission: RE | Admit: 2024-05-26 | Discharge: 2024-05-26 | Disposition: A | Discharge status: Home / Self Care | Source: Ambulatory Visit | Attending: Vascular Surgery | Admitting: Vascular Surgery

## 2024-05-26 ENCOUNTER — Ambulatory Visit (INDEPENDENT_AMBULATORY_CARE_PROVIDER_SITE_OTHER): Admitting: Vascular Surgery

## 2024-05-26 VITALS — BP 198/112 | HR 58 | Temp 97.6°F | Wt 120.0 lb

## 2024-05-26 DIAGNOSIS — I7143 Infrarenal abdominal aortic aneurysm, without rupture: Secondary | ICD-10-CM | POA: Insufficient documentation

## 2024-05-26 DIAGNOSIS — R079 Chest pain, unspecified: Secondary | ICD-10-CM | POA: Insufficient documentation

## 2024-08-10 ENCOUNTER — Telehealth: Payer: Self-pay | Admitting: Internal Medicine

## 2024-08-10 NOTE — Telephone Encounter (Signed)
 Patient called and wanted to pick up her medical records on 08/15/24.  I told her they would be at the front desk for her.
